# Patient Record
Sex: Female | Born: 1995 | Race: White | Hispanic: No | Marital: Single | Smoking: Never smoker
Health system: Southern US, Community
[De-identification: ages and names within clinical notes are randomized; demographics above are authoritative.]

## PROBLEM LIST (undated history)

## (undated) ENCOUNTER — Inpatient Hospital Stay (HOSPITAL_COMMUNITY): Payer: Self-pay

## (undated) ENCOUNTER — Ambulatory Visit: Admission: EM | Payer: Medicaid Other | Source: Home / Self Care

## (undated) ENCOUNTER — Emergency Department (HOSPITAL_COMMUNITY): Admission: EM | Payer: Medicaid Other | Source: Home / Self Care

## (undated) DIAGNOSIS — F419 Anxiety disorder, unspecified: Secondary | ICD-10-CM

## (undated) DIAGNOSIS — N83202 Unspecified ovarian cyst, left side: Secondary | ICD-10-CM

## (undated) HISTORY — PX: WISDOM TOOTH EXTRACTION: SHX21

## (undated) HISTORY — PX: TONSILLECTOMY: SUR1361

## (undated) HISTORY — PX: TONSILLECTOMY AND ADENOIDECTOMY: SHX28

---
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
## 2015-05-09 ENCOUNTER — Encounter (HOSPITAL_COMMUNITY): Payer: Self-pay | Admitting: *Deleted

## 2015-05-09 ENCOUNTER — Emergency Department (INDEPENDENT_AMBULATORY_CARE_PROVIDER_SITE_OTHER)
Admission: EM | Admit: 2015-05-09 | Discharge: 2015-05-09 | Disposition: A | Discharge status: Home / Self Care | Payer: Medicaid Other | Source: Home / Self Care | Attending: Emergency Medicine | Admitting: Emergency Medicine

## 2015-05-09 DIAGNOSIS — Z23 Encounter for immunization: Secondary | ICD-10-CM | POA: Diagnosis not present

## 2015-05-09 DIAGNOSIS — L03012 Cellulitis of left finger: Secondary | ICD-10-CM | POA: Diagnosis not present

## 2015-05-09 MED ORDER — TETANUS-DIPHTH-ACELL PERTUSSIS 5-2.5-18.5 LF-MCG/0.5 IM SUSP
INTRAMUSCULAR | Status: AC
  Filled 2015-05-09: qty 0.5

## 2015-05-09 MED ORDER — TETANUS-DIPHTH-ACELL PERTUSSIS 5-2.5-18.5 LF-MCG/0.5 IM SUSP
0.5000 mL | Freq: Once | INTRAMUSCULAR | Status: AC
  Administered 2015-05-09: 0.5 mL via INTRAMUSCULAR

## 2015-05-09 MED ORDER — AMOXICILLIN-POT CLAVULANATE 875-125 MG PO TABS: 1.0000 | ORAL_TABLET | Freq: Two times a day (BID) | ORAL | Status: DC

## 2015-05-09 NOTE — Discharge Instructions (Signed)
You have an infection called paronychia. This is an infection around the nail. Take Augmentin twice a day for 10 days. This often causes some diarrhea. Soak your finger in warm soapy water 3 times a day. I do not see any signs of infection being in your bloodstream. You can take 600 mg of ibuprofen every 6 hours as needed for pain. Follow-up if not improving in 2 days.

## 2015-05-09 NOTE — ED Notes (Signed)
Pt  Reports      l  Middle    Finger       Had  A  Hangnail        And  She  Went  To a  Nail  Salon  And  They  Worked  On the  Nail  With  A  Clipper    Now  She  Has  Pain     Going up  Hand  And  Arm

## 2015-05-09 NOTE — ED Provider Notes (Signed)
CSN: [id number]     Arrival date & time 05/09/15  1906 History   First MD Initiated Contact with Patient 05/09/15 1916     Chief Complaint  Patient presents with  . Hand Problem   (Consider location/radiation/quality/duration/timing/severity/associated sxs/prior Treatment) HPI She is a 19 year old woman here for evaluation of left middle finger infection. She states she had an infected hangnail about 3 days ago. She went to the nail salon today and they dug out the infection. She reports throbbing pain in the distal middle finger since that time. She also reports feeling pain into her wrist and forearm. She describes swollen lymph nodes in her neck as well. She denies any fevers or chills. She does not know when her last tetanus shot was.  History reviewed. No pertinent past medical history. History reviewed. No pertinent past surgical history. History reviewed. No pertinent family history. Social History  Substance Use Topics  . Smoking status: None  . Smokeless tobacco: None  . Alcohol Use: No   OB History    No data available     Review of Systems As in history of present illness Allergies  Codeine  Home Medications   Prior to Admission medications   Medication Sig Start Date End Date Taking? Authorizing Provider  amoxicillin-clavulanate (AUGMENTIN) 875-125 MG tablet Take 1 tablet by mouth 2 (two) times daily. 05/09/15   Charm RingsErin J Hosanna Betley, MD   Meds Ordered and Administered this Visit   Medications  Tdap (BOOSTRIX) injection 0.5 mL (not administered)    BP 115/69 mmHg  Pulse 94  Temp(Src) 98 F (36.7 C) (Oral)  SpO2 96% No data found.   Physical Exam  Constitutional: She is oriented to person, place, and time. She appears well-developed and well-nourished. No distress.  HENT:  Mouth/Throat: Oropharynx is clear and moist. No oropharyngeal exudate.  Neck: Neck supple.  Cardiovascular: Normal rate.   Pulmonary/Chest: Effort normal.  Lymphadenopathy:    She has  cervical adenopathy.  Neurological: She is alert and oriented to person, place, and time.  Skin:  Left middle finger: There is erythema primarily to the radial aspect of the nail. No palpable fluctuance. No streaking. She has full extension and flexion of her finger without pain. No swelling or tenderness of the wrist or forearm.    ED Course  Procedures (including critical care time)  Labs Review Labs Reviewed - No data to display  Imaging Review No results found.   MDM   1. Paronychia, left    Treatment with Augmentin. Tetanus updated. Follow-up if no improvement in 2 days.    Charm RingsErin J Nikisha Fleece, MD 05/09/15 [phone]

## 2016-06-05 DIAGNOSIS — O1493 Unspecified pre-eclampsia, third trimester: Secondary | ICD-10-CM

## 2016-06-05 HISTORY — DX: Unspecified pre-eclampsia, third trimester: O14.93

## 2016-12-18 ENCOUNTER — Other Ambulatory Visit (HOSPITAL_COMMUNITY): Payer: Self-pay | Admitting: Obstetrics and Gynecology

## 2016-12-18 DIAGNOSIS — Z3689 Encounter for other specified antenatal screening: Secondary | ICD-10-CM

## 2016-12-25 ENCOUNTER — Encounter (HOSPITAL_COMMUNITY): Payer: Self-pay

## 2016-12-26 ENCOUNTER — Ambulatory Visit (HOSPITAL_COMMUNITY)
Admission: RE | Admit: 2016-12-26 | Discharge: 2016-12-26 | Disposition: A | Discharge status: Home / Self Care | Payer: Medicaid Other | Source: Ambulatory Visit | Attending: Obstetrics and Gynecology | Admitting: Obstetrics and Gynecology

## 2016-12-26 ENCOUNTER — Encounter (HOSPITAL_COMMUNITY): Payer: Self-pay

## 2016-12-26 ENCOUNTER — Other Ambulatory Visit (HOSPITAL_COMMUNITY): Payer: Self-pay | Admitting: Obstetrics and Gynecology

## 2016-12-26 DIAGNOSIS — O358XX0 Maternal care for other (suspected) fetal abnormality and damage, not applicable or unspecified: Secondary | ICD-10-CM | POA: Insufficient documentation

## 2016-12-26 DIAGNOSIS — Z3A20 20 weeks gestation of pregnancy: Secondary | ICD-10-CM | POA: Diagnosis not present

## 2016-12-26 DIAGNOSIS — IMO0001 Reserved for inherently not codable concepts without codable children: Secondary | ICD-10-CM

## 2016-12-26 DIAGNOSIS — IMO0002 Reserved for concepts with insufficient information to code with codable children: Secondary | ICD-10-CM | POA: Insufficient documentation

## 2016-12-26 DIAGNOSIS — Z363 Encounter for antenatal screening for malformations: Secondary | ICD-10-CM | POA: Insufficient documentation

## 2016-12-26 DIAGNOSIS — Z3689 Encounter for other specified antenatal screening: Secondary | ICD-10-CM

## 2016-12-26 DIAGNOSIS — Q9359 Other deletions of part of a chromosome: Secondary | ICD-10-CM | POA: Insufficient documentation

## 2016-12-26 HISTORY — DX: Anxiety disorder, unspecified: F41.9

## 2016-12-26 NOTE — Progress Notes (Signed)
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 She has ben sent here for confirmation and evaluation. Her pregnancy course has been unremarkable so far.. She reports good fetal movement and no vaginal bleeding. There are no primary relatives with renal anomaly but does report a paternal cousin with unilateral renal agenesis OB History: OB History    Gravida Para Term Preterm AB Living   1         0   SAB TAB Ectopic Multiple Live Births                  PMH:  Past Medical History:  Diagnosis Date  . Anxiety     PSH:  Past Surgical History:  Procedure Laterality Date  . TONSILLECTOMY AND ADENOIDECTOMY     Meds: PNV Allergies: Codiene FH: As above. Father has CHTN, paternal grandfather has diabetes Soc: see EPIC section  Review of Systems: no vaginal bleeding or cramping/contractions, no LOF, no nausea/vomiting. All other systems reviewed and are negative.  PNL: see EPIC section   PE: See EPIC section  Please see separate document for fetal ultrasound report.  A/P: Unilateral renal agenesis: I discussed the finding with the patient and her partner. They understand that unilateral agenesis usually does not cause any interference with the pregnancy course, but that the pediatrician should be informed to arrange follow up in the postnatal period. There is no significant association between unilateral agenesis and any genetic abnormalities. A repeat scan at 28 and 34-36 weeks can be considered and this can be done in your office our ours, at your discretion  Thank you for the opportunity to be a part of the care of Tricia Allen. Please contact our office if we can be of further assistance.   I spent approximately 30 minutes with this patient with over 50% of  time spent in face-to-face counseling.

## 2017-01-14 ENCOUNTER — Inpatient Hospital Stay (HOSPITAL_COMMUNITY)
Admission: AD | Admit: 2017-01-14 | Discharge: 2017-01-14 | Disposition: A | Discharge status: Home / Self Care | Payer: Medicaid Other | Source: Ambulatory Visit | Attending: Obstetrics & Gynecology | Admitting: Obstetrics & Gynecology

## 2017-01-14 ENCOUNTER — Encounter (HOSPITAL_COMMUNITY): Payer: Self-pay

## 2017-01-14 DIAGNOSIS — O9989 Other specified diseases and conditions complicating pregnancy, childbirth and the puerperium: Secondary | ICD-10-CM

## 2017-01-14 DIAGNOSIS — Z885 Allergy status to narcotic agent status: Secondary | ICD-10-CM | POA: Insufficient documentation

## 2017-01-14 DIAGNOSIS — O2342 Unspecified infection of urinary tract in pregnancy, second trimester: Secondary | ICD-10-CM

## 2017-01-14 DIAGNOSIS — Z9889 Other specified postprocedural states: Secondary | ICD-10-CM | POA: Diagnosis not present

## 2017-01-14 DIAGNOSIS — Z3A23 23 weeks gestation of pregnancy: Secondary | ICD-10-CM | POA: Insufficient documentation

## 2017-01-14 DIAGNOSIS — M549 Dorsalgia, unspecified: Secondary | ICD-10-CM | POA: Diagnosis present

## 2017-01-14 LAB — URINALYSIS, ROUTINE W REFLEX MICROSCOPIC
Bilirubin Urine: NEGATIVE
Glucose, UA: NEGATIVE mg/dL
Hgb urine dipstick: NEGATIVE
KETONES UR: NEGATIVE mg/dL
Nitrite: NEGATIVE
PH: 7 (ref 5.0–8.0)
Protein, ur: NEGATIVE mg/dL
Specific Gravity, Urine: 1.005 (ref 1.005–1.030)

## 2017-01-14 LAB — CBC
HEMATOCRIT: 29 % — AB (ref 36.0–46.0)
HEMOGLOBIN: 10.3 g/dL — AB (ref 12.0–15.0)
MCH: 33.9 pg (ref 26.0–34.0)
MCHC: 35.5 g/dL (ref 30.0–36.0)
MCV: 95.4 fL (ref 78.0–100.0)
Platelets: 177 10*3/uL (ref 150–400)
RBC: 3.04 MIL/uL — AB (ref 3.87–5.11)
RDW: 12.5 % (ref 11.5–15.5)
WBC: 8.8 10*3/uL (ref 4.0–10.5)

## 2017-01-14 MED ORDER — CEPHALEXIN 500 MG PO CAPS: 500.0000 mg | ORAL_CAPSULE | Freq: Four times a day (QID) | ORAL | 0 refills | Status: AC

## 2017-01-14 MED ORDER — ACETAMINOPHEN 500 MG PO TABS
1000.0000 mg | ORAL_TABLET | Freq: Four times a day (QID) | ORAL | Status: DC | PRN
  Administered 2017-01-14: 1000 mg via ORAL
  Filled 2017-01-14: qty 2

## 2017-01-14 NOTE — MAU Note (Signed)
Patient presents to MAU with c/o back and right side pain for the last week. Was treated prophylactic for a UTI at her office visit last week. Pain has continued and gotten worse and constant yesterday. +FM. Denies LOF and VB.

## 2017-01-14 NOTE — MAU Provider Note (Signed)
History     CSN: [id number]  Arrival date and time: 01/14/17 2047   First Provider Initiated Contact with Patient 01/14/17 2126      Chief Complaint  Patient presents with  . Back Pain   G1 @23 .4 weeks here with back and flank pain. Sx started 1 week ago. Pain is on the right mid back and right flank and is constant. She has not taken anything for the pain. She was seen at her primary OB and started Macrobid for presumed UTI 5 days ago and has 1 pill left. No UA or UC are on file. Pain worsened 3 days ago. No dysuria, polyuria, or hematuria. She denies fever but feels chills at times. Feels nauseated at times but no vomiting. Eating and drinking well. She denies any recent lifting or strenuous activity. Reports good FM. No ctx, VB, or LOF.    OB History    Gravida Para Term Preterm AB Living   1         0   SAB TAB Ectopic Multiple Live Births                  Past Medical History:  Diagnosis Date  . Anxiety     Past Surgical History:  Procedure Laterality Date  . TONSILLECTOMY    . TONSILLECTOMY AND ADENOIDECTOMY      No family history on file.  Social History  Substance Use Topics  . Smoking status: Never Smoker  . Smokeless tobacco: Never Used  . Alcohol use No    Allergies:  Allergies  Allergen Reactions  . Codeine     Prescriptions Prior to Admission  Medication Sig Dispense Refill Last Dose  . IRON PO Take by mouth.   Past Week at Unknown time  . nitrofurantoin, macrocrystal-monohydrate, (MACROBID) 100 MG capsule Take 100 mg by mouth 2 (two) times daily.   01/14/2017 at 2000  . Prenatal Vit w/Fe-Methylfol-FA (PNV PO) Take by mouth.   Past Week at Unknown time  . amoxicillin-clavulanate (AUGMENTIN) 875-125 MG tablet Take 1 tablet by mouth 2 (two) times daily. (Patient not taking: Reported on 12/26/2016) 20 tablet 0 Not Taking    Review of Systems  Constitutional: Positive for chills. Negative for fever.  Gastrointestinal: Positive for nausea. Negative  for abdominal pain, constipation, diarrhea and vomiting.  Genitourinary: Negative for dysuria, frequency, hematuria and vaginal bleeding.  Musculoskeletal: Positive for back pain.   Physical Exam   Blood pressure 125/75, pulse 97, temperature 98.8 F (37.1 C), temperature source Oral, resp. rate 16, height 5\' 2"  (1.575 m), weight 134 lb (60.8 kg), last menstrual period 08/02/2016, SpO2 98 %.  Physical Exam  Nursing note and vitals reviewed. Constitutional: She is oriented to person, place, and time. She appears well-developed and well-nourished. No distress (appears comfortable).  HENT:  Head: Normocephalic and atraumatic.  Neck: Normal range of motion.  Respiratory: Effort normal. No respiratory distress.  GI: Soft. She exhibits no distension. There is no tenderness.    gravid  Musculoskeletal: Normal range of motion.       Cervical back: Normal. She exhibits no tenderness.       Thoracic back: She exhibits tenderness (right).       Lumbar back: Normal. She exhibits no tenderness.  Neurological: She is alert and oriented to person, place, and time.  Skin: Skin is warm and dry.  Psychiatric: She has a normal mood and affect.  EFM: 140 bpm, mod variability, + accels, no decels Toco: none  Results for orders placed or performed during the hospital encounter of 01/14/17 (from the past 24 hour(s))  Urinalysis, Routine w reflex microscopic     Status: Abnormal   Collection Time: 01/14/17  9:00 PM  Result Value Ref Range   Color, Urine STRAW (A) YELLOW   APPearance CLEAR CLEAR   Specific Gravity, Urine 1.005 1.005 - 1.030   pH 7.0 5.0 - 8.0   Glucose, UA NEGATIVE NEGATIVE mg/dL   Hgb urine dipstick NEGATIVE NEGATIVE   Bilirubin Urine NEGATIVE NEGATIVE   Ketones, ur NEGATIVE NEGATIVE mg/dL   Protein, ur NEGATIVE NEGATIVE mg/dL   Nitrite NEGATIVE NEGATIVE   Leukocytes, UA SMALL (A) NEGATIVE   RBC / HPF 0-5 0 - 5 RBC/hpf   WBC, UA 0-5 0 - 5 WBC/hpf   Bacteria, UA MANY (A) NONE  SEEN   Squamous Epithelial / LPF 0-5 (A) NONE SEEN  CBC     Status: Abnormal   Collection Time: 01/14/17  9:46 PM  Result Value Ref Range   WBC 8.8 4.0 - 10.5 K/uL   RBC 3.04 (L) 3.87 - 5.11 MIL/uL   Hemoglobin 10.3 (L) 12.0 - 15.0 g/dL   HCT 16.129.0 (L) 09.636.0 - 04.546.0 %   MCV 95.4 78.0 - 100.0 fL   MCH 33.9 26.0 - 34.0 pg   MCHC 35.5 30.0 - 36.0 g/dL   RDW 40.912.5 81.111.5 - 91.415.5 %   Platelets 177 150 - 400 K/uL   MAU Course  Procedures Tylenol Heating pad  MDM Labs ordered and reviewed. Suspect UTI based on sx and UA, unlikely pyelo at this time. Pain improved after Tylenol and heat. Will treat with better and longer coverage abx. Stable for discharge home  Assessment and Plan   1. [redacted] weeks gestation of pregnancy   2. Urinary tract infection in mother during second trimester of pregnancy    Discharge home Follow up with primary OB as scheduled Rx Keflex UC pending Pyelo/ return precautions  Allergies as of 01/14/2017      Reactions   Codeine       Medication List    STOP taking these medications   amoxicillin-clavulanate 875-125 MG tablet Commonly known as:  AUGMENTIN   nitrofurantoin (macrocrystal-monohydrate) 100 MG capsule Commonly known as:  MACROBID     TAKE these medications   cephALEXin 500 MG capsule Commonly known as:  KEFLEX Take 1 capsule (500 mg total) by mouth 4 (four) times daily.   IRON PO Take by mouth.   PNV PO Take by mouth.      Donette LarryMelanie Greta Yung, CNM 01/14/2017, 10:46 PM

## 2017-01-14 NOTE — Discharge Instructions (Signed)

## 2017-01-16 LAB — CULTURE, OB URINE: Culture: NO GROWTH

## 2017-01-17 ENCOUNTER — Telehealth: Payer: Self-pay | Admitting: Certified Nurse Midwife

## 2017-01-17 NOTE — Telephone Encounter (Signed)
Notified pt that UC was negative. She reports back pain is improving but still present. I instructed her to stop the Keflex but notify her primary OB provider if pain worsens or does not completely resolve. She verbalized understanding.

## 2017-08-01 ENCOUNTER — Encounter (HOSPITAL_COMMUNITY): Payer: Self-pay

## 2018-07-26 IMAGING — US US MFM OB DETAIL+14 WK
1 series · 14 of 28 positions shown · non-contrast
Comparison: none

[Series 1: us mfm ob detail+14 wk · 78 acquisitions, 14 frames shown]
[im 3/78]
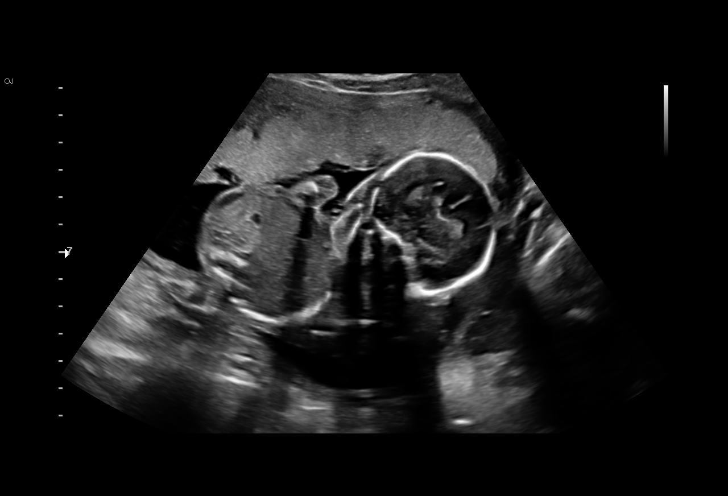
[im 9/78]
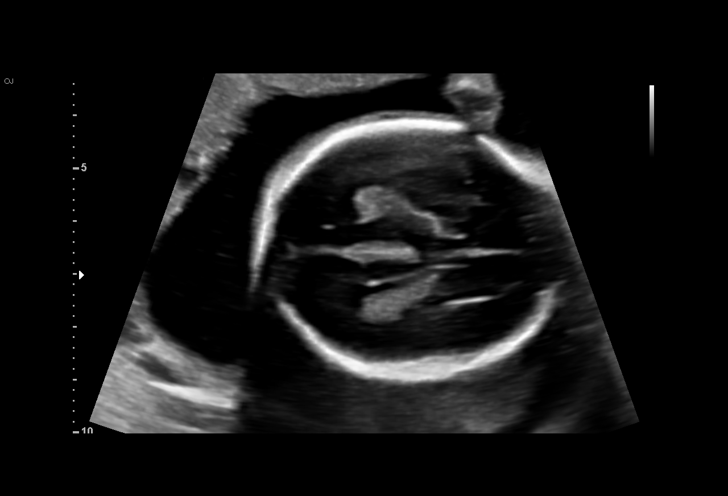
[im 15/78]
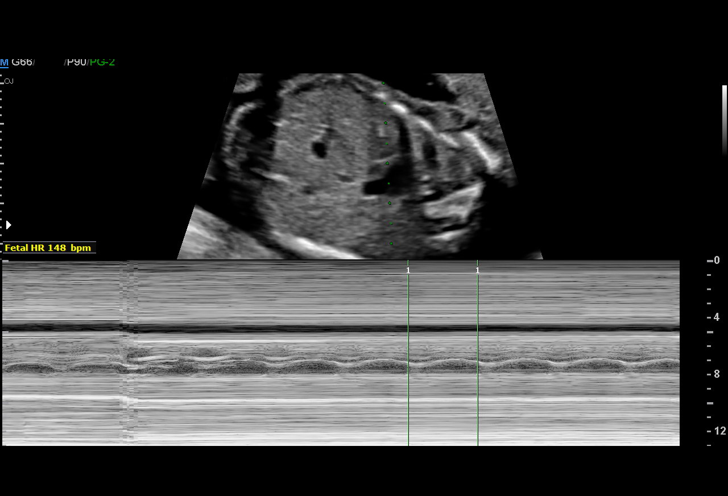
[im 20/78]
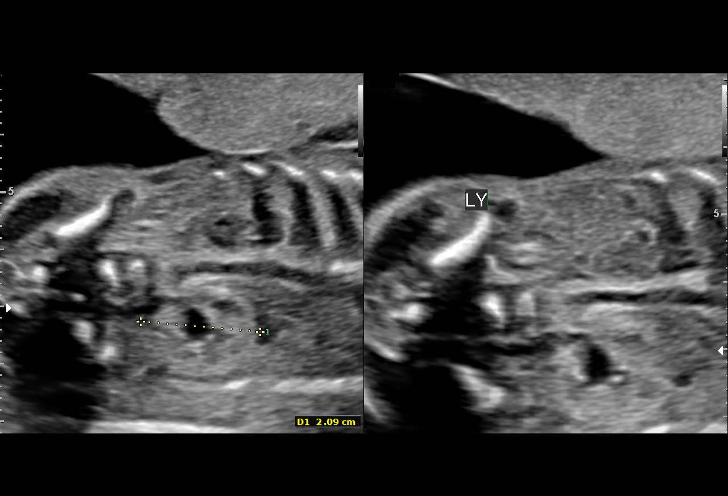
[im 26/78]
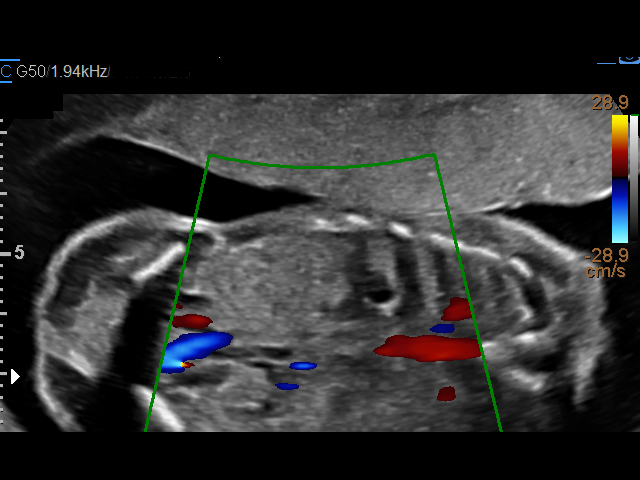
[im 32/78]
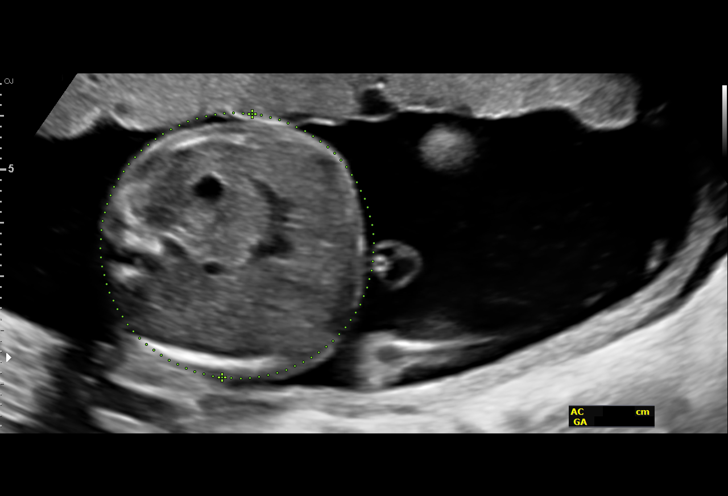
[im 38/78]
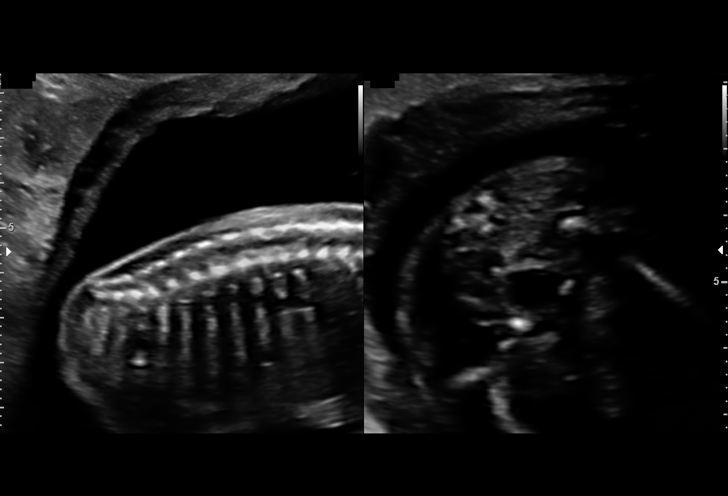
[im 43/78]
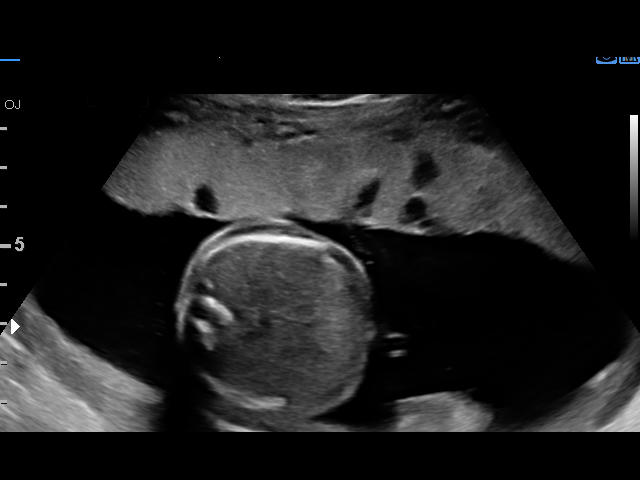
[im 49/78]
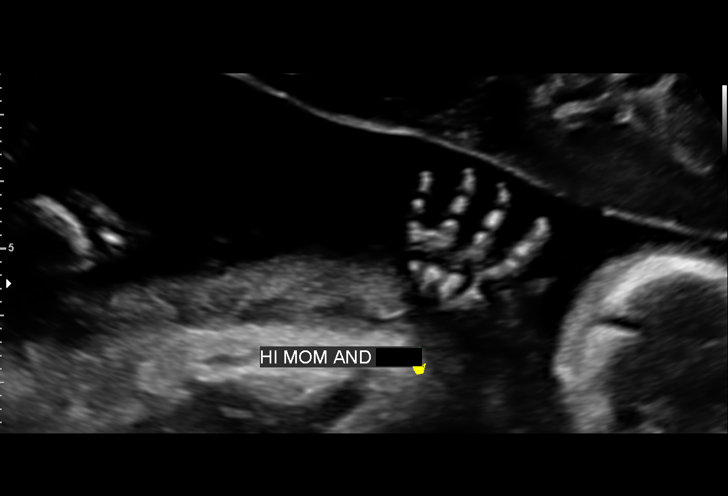
[im 55/78]
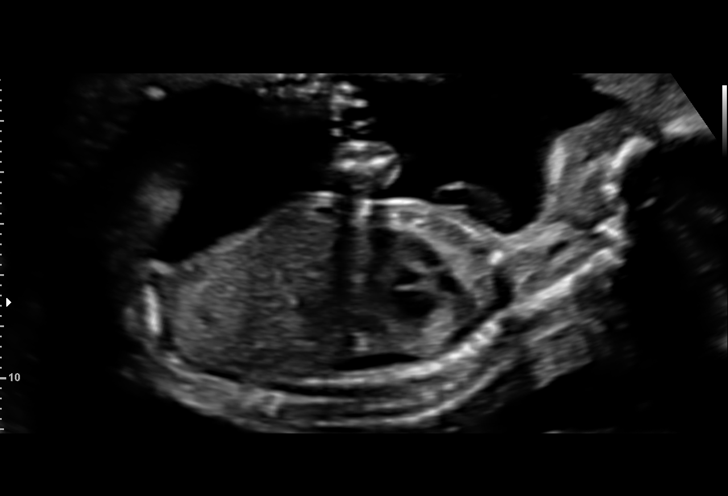
[im 60/78]
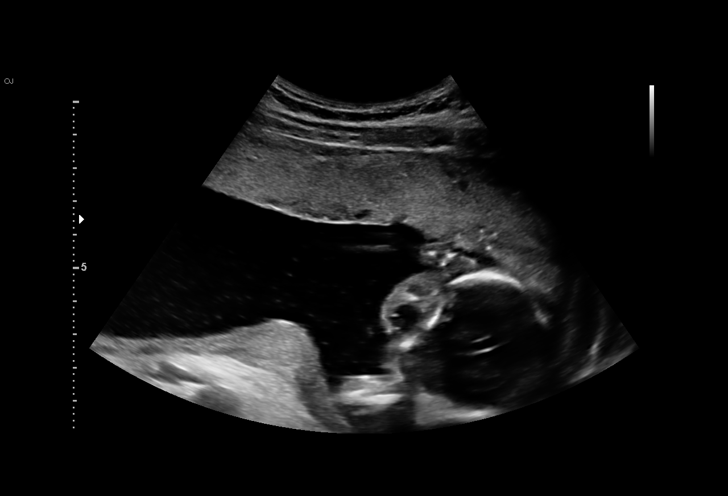
[im 66/78]
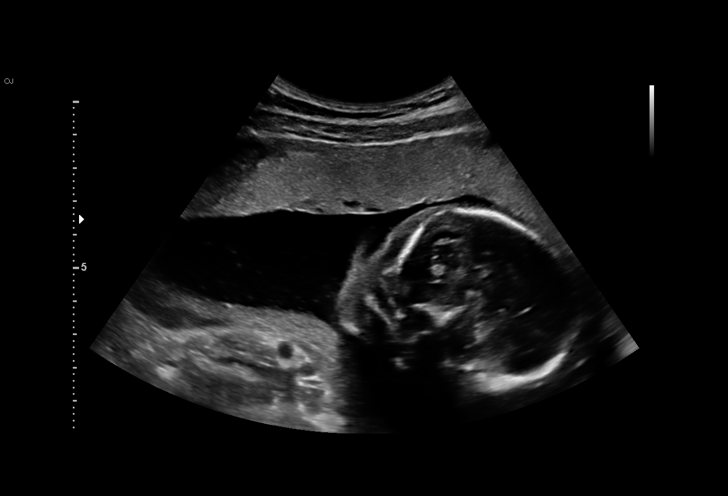
[im 72/78]
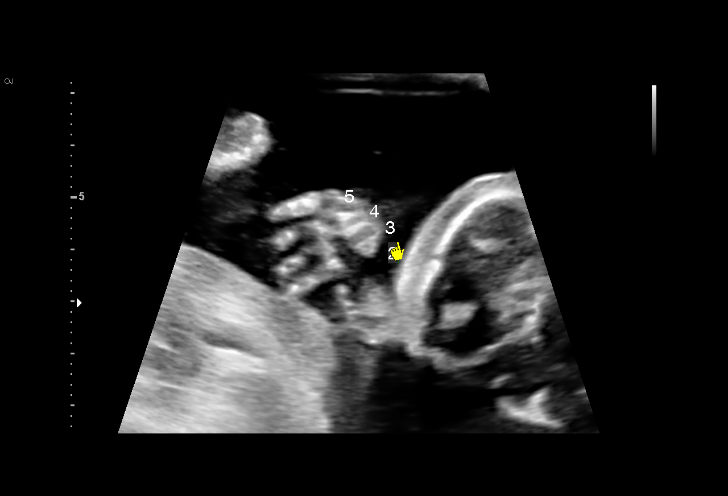
[im 78/78]
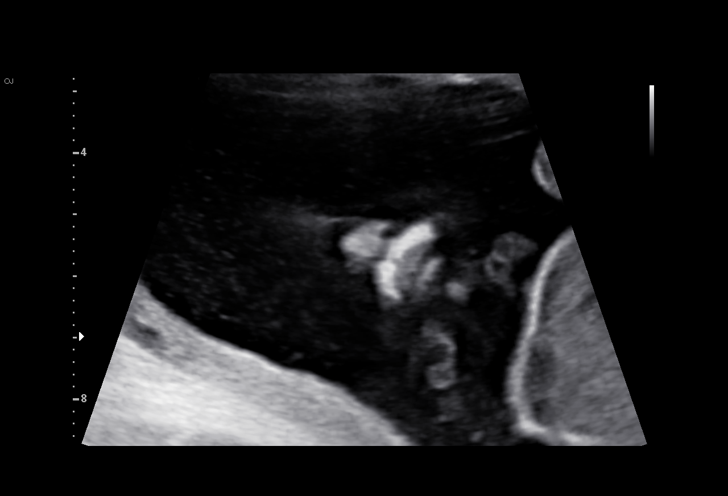

[14 of 28 positions shown; findings below may reference images not displayed]

1  ANGI BILLIOT           [id number]      [id number]     [id number]
Indications

20 weeks gestation of pregnancy
Encounter for antenatal screening for
malformations
Fetal renal anomaly, unspecified fetus(left
renal agenesis
OB History

Blood Type:            Height:  5'2"   Weight (lb):  128       BMI:
Gravidity:    1         Term:   0        Prem:   0        SAB:   0
TOP:          0       Ectopic:  0        Living: 0
Fetal Evaluation

Num Of Fetuses:     1
Fetal Heart         148
Rate(bpm):
Cardiac Activity:   Observed
Presentation:       Cephalic
Placenta:           Anterior, above cervical os
P. Cord Insertion:  Visualized

Amniotic Fluid
AFI FV:      Subjectively within normal limits

Largest Pocket(cm)
5.0
Biometry

BPD:      48.1  mm     G. Age:  20w 4d         36  %    CI:        75.38   %    70 - 86
FL/HC:      19.2   %    15.9 -
HC:      175.7  mm     G. Age:  20w 0d         13  %    HC/AC:      1.14        1.06 -
AC:      154.5  mm     G. Age:  20w 5d         36  %    FL/BPD:     70.1   %
FL:       33.7  mm     G. Age:  20w 4d         31  %    FL/AC:      21.8   %    20 - 24
HUM:      32.6  mm     G. Age:  21w 0d         50  %
CER:      22.6  mm     G. Age:  21w 2d         61  %
NFT:       5.8  mm
CM:        5.4  mm

Est. FW:     361  gm    0 lb 13 oz      38  %
Gestational Age

LMP:           20w 6d        Date:  08/02/16                 EDD:   05/09/17
U/S Today:     20w 3d                                        EDD:   05/12/17
Best:          20w 6d     Det. By:  LMP  (08/02/16)          EDD:   05/09/17
Anatomy

Cranium:               Appears normal         Aortic Arch:            Appears normal
Cavum:                 Appears normal         Ductal Arch:            Appears normal
Ventricles:            Appears normal         Diaphragm:              Appears normal
Choroid Plexus:        Appears normal         Stomach:                Appears normal, left
sided
Cerebellum:            Appears normal         Abdomen:                Appears normal
Posterior Fossa:       Appears normal         Abdominal Wall:         Appears nml (cord
insert, abd wall)
Nuchal Fold:           Not applicable (>20    Cord Vessels:           Appears normal (3
wks GA)                                        vessel cord)
Face:                  Appears normal         Kidneys:                Absent left kidney
(orbits and profile)
Lips:                  Appears normal         Bladder:                Appears normal
Thoracic:              Appears normal         Spine:                  Appears normal
Heart:                 Echogenic focus        Upper Extremities:      Appears normal
in LV
RVOT:                  Appears normal         Lower Extremities:      Appears normal
LVOT:                  Appears normal

Other:  Male gender. Heels and Rt 5th digit visualized.
Cervix Uterus Adnexa

Cervix
Length:            3.5  cm.
Normal appearance by transabdominal scan.

Uterus
Normal shape and size.
Impression

Singleton intrauterine pregnancy at 20+6 weeks with
suspected unilateral renal agenesis
Review of the anatomy shows an empty left renal fossae.
there is an absent left renal artery. There is a nonpathologic
echogenic intracardiac focus. There are no other sonographic
markers for aneuploidy or structural anomalies
all relevant anatomy has been visualized
Amniotic fluid volume is normal
Estimated fetal weight is 361g which is growth in the 38th
percentile
Recommendations
See MFM consult

## 2019-12-04 DIAGNOSIS — Z419 Encounter for procedure for purposes other than remedying health state, unspecified: Secondary | ICD-10-CM | POA: Diagnosis not present

## 2019-12-19 DIAGNOSIS — J069 Acute upper respiratory infection, unspecified: Secondary | ICD-10-CM | POA: Diagnosis not present

## 2019-12-19 DIAGNOSIS — M545 Low back pain: Secondary | ICD-10-CM | POA: Diagnosis not present

## 2019-12-19 DIAGNOSIS — R319 Hematuria, unspecified: Secondary | ICD-10-CM | POA: Diagnosis not present

## 2019-12-19 DIAGNOSIS — R0789 Other chest pain: Secondary | ICD-10-CM | POA: Diagnosis not present

## 2019-12-19 DIAGNOSIS — J029 Acute pharyngitis, unspecified: Secondary | ICD-10-CM | POA: Diagnosis not present

## 2019-12-25 DIAGNOSIS — J029 Acute pharyngitis, unspecified: Secondary | ICD-10-CM | POA: Diagnosis not present

## 2019-12-25 DIAGNOSIS — Z20822 Contact with and (suspected) exposure to covid-19: Secondary | ICD-10-CM | POA: Diagnosis not present

## 2019-12-30 DIAGNOSIS — Z113 Encounter for screening for infections with a predominantly sexual mode of transmission: Secondary | ICD-10-CM | POA: Diagnosis not present

## 2019-12-30 DIAGNOSIS — Z3009 Encounter for other general counseling and advice on contraception: Secondary | ICD-10-CM | POA: Diagnosis not present

## 2019-12-30 DIAGNOSIS — Z114 Encounter for screening for human immunodeficiency virus [HIV]: Secondary | ICD-10-CM | POA: Diagnosis not present

## 2019-12-30 DIAGNOSIS — Z202 Contact with and (suspected) exposure to infections with a predominantly sexual mode of transmission: Secondary | ICD-10-CM | POA: Diagnosis not present

## 2020-01-09 DIAGNOSIS — R079 Chest pain, unspecified: Secondary | ICD-10-CM | POA: Diagnosis not present

## 2020-01-09 DIAGNOSIS — F410 Panic disorder [episodic paroxysmal anxiety] without agoraphobia: Secondary | ICD-10-CM | POA: Diagnosis not present

## 2020-01-09 DIAGNOSIS — F411 Generalized anxiety disorder: Secondary | ICD-10-CM | POA: Diagnosis not present

## 2020-02-04 DIAGNOSIS — Z419 Encounter for procedure for purposes other than remedying health state, unspecified: Secondary | ICD-10-CM | POA: Diagnosis not present

## 2020-02-18 DIAGNOSIS — N939 Abnormal uterine and vaginal bleeding, unspecified: Secondary | ICD-10-CM | POA: Diagnosis not present

## 2020-02-18 DIAGNOSIS — Z124 Encounter for screening for malignant neoplasm of cervix: Secondary | ICD-10-CM | POA: Diagnosis not present

## 2020-03-11 DIAGNOSIS — L819 Disorder of pigmentation, unspecified: Secondary | ICD-10-CM | POA: Diagnosis not present

## 2020-03-11 DIAGNOSIS — N898 Other specified noninflammatory disorders of vagina: Secondary | ICD-10-CM | POA: Diagnosis not present

## 2020-03-15 DIAGNOSIS — J029 Acute pharyngitis, unspecified: Secondary | ICD-10-CM | POA: Diagnosis not present

## 2020-04-12 DIAGNOSIS — N9089 Other specified noninflammatory disorders of vulva and perineum: Secondary | ICD-10-CM | POA: Diagnosis not present

## 2020-04-15 DIAGNOSIS — R319 Hematuria, unspecified: Secondary | ICD-10-CM | POA: Diagnosis not present

## 2020-05-05 DIAGNOSIS — Z419 Encounter for procedure for purposes other than remedying health state, unspecified: Secondary | ICD-10-CM | POA: Diagnosis not present

## 2020-06-05 DIAGNOSIS — Z419 Encounter for procedure for purposes other than remedying health state, unspecified: Secondary | ICD-10-CM | POA: Diagnosis not present

## 2020-06-10 DIAGNOSIS — Z8739 Personal history of other diseases of the musculoskeletal system and connective tissue: Secondary | ICD-10-CM | POA: Diagnosis not present

## 2020-06-10 DIAGNOSIS — Z113 Encounter for screening for infections with a predominantly sexual mode of transmission: Secondary | ICD-10-CM | POA: Diagnosis not present

## 2020-06-10 DIAGNOSIS — Z87448 Personal history of other diseases of urinary system: Secondary | ICD-10-CM | POA: Diagnosis not present

## 2020-06-24 DIAGNOSIS — R110 Nausea: Secondary | ICD-10-CM | POA: Diagnosis not present

## 2020-06-24 DIAGNOSIS — R109 Unspecified abdominal pain: Secondary | ICD-10-CM | POA: Diagnosis not present

## 2020-06-24 DIAGNOSIS — R1031 Right lower quadrant pain: Secondary | ICD-10-CM | POA: Diagnosis not present

## 2020-07-06 DIAGNOSIS — Z419 Encounter for procedure for purposes other than remedying health state, unspecified: Secondary | ICD-10-CM | POA: Diagnosis not present

## 2020-07-12 DIAGNOSIS — R3129 Other microscopic hematuria: Secondary | ICD-10-CM | POA: Diagnosis not present

## 2020-07-16 DIAGNOSIS — R3129 Other microscopic hematuria: Secondary | ICD-10-CM | POA: Diagnosis not present

## 2020-07-16 DIAGNOSIS — R109 Unspecified abdominal pain: Secondary | ICD-10-CM | POA: Diagnosis not present

## 2020-07-19 DIAGNOSIS — Z87442 Personal history of urinary calculi: Secondary | ICD-10-CM | POA: Diagnosis not present

## 2020-07-19 DIAGNOSIS — M546 Pain in thoracic spine: Secondary | ICD-10-CM | POA: Diagnosis not present

## 2020-07-19 DIAGNOSIS — R3129 Other microscopic hematuria: Secondary | ICD-10-CM | POA: Diagnosis not present

## 2020-07-19 DIAGNOSIS — Z79899 Other long term (current) drug therapy: Secondary | ICD-10-CM | POA: Diagnosis not present

## 2020-07-19 DIAGNOSIS — G8929 Other chronic pain: Secondary | ICD-10-CM | POA: Diagnosis not present

## 2020-07-20 DIAGNOSIS — L700 Acne vulgaris: Secondary | ICD-10-CM | POA: Diagnosis not present

## 2020-07-29 DIAGNOSIS — M546 Pain in thoracic spine: Secondary | ICD-10-CM | POA: Diagnosis not present

## 2020-07-29 DIAGNOSIS — L700 Acne vulgaris: Secondary | ICD-10-CM | POA: Diagnosis not present

## 2020-08-03 DIAGNOSIS — Z419 Encounter for procedure for purposes other than remedying health state, unspecified: Secondary | ICD-10-CM | POA: Diagnosis not present

## 2020-08-04 DIAGNOSIS — Z3009 Encounter for other general counseling and advice on contraception: Secondary | ICD-10-CM | POA: Diagnosis not present

## 2020-08-04 DIAGNOSIS — Z7251 High risk heterosexual behavior: Secondary | ICD-10-CM | POA: Diagnosis not present

## 2020-08-04 DIAGNOSIS — Z113 Encounter for screening for infections with a predominantly sexual mode of transmission: Secondary | ICD-10-CM | POA: Diagnosis not present

## 2020-08-04 DIAGNOSIS — Z202 Contact with and (suspected) exposure to infections with a predominantly sexual mode of transmission: Secondary | ICD-10-CM | POA: Diagnosis not present

## 2020-08-04 DIAGNOSIS — Z114 Encounter for screening for human immunodeficiency virus [HIV]: Secondary | ICD-10-CM | POA: Diagnosis not present

## 2020-08-20 DIAGNOSIS — R3129 Other microscopic hematuria: Secondary | ICD-10-CM | POA: Diagnosis not present

## 2020-08-20 DIAGNOSIS — A491 Streptococcal infection, unspecified site: Secondary | ICD-10-CM | POA: Diagnosis not present

## 2020-08-27 DIAGNOSIS — L700 Acne vulgaris: Secondary | ICD-10-CM | POA: Diagnosis not present

## 2020-08-27 DIAGNOSIS — Z79899 Other long term (current) drug therapy: Secondary | ICD-10-CM | POA: Diagnosis not present

## 2020-08-30 DIAGNOSIS — L700 Acne vulgaris: Secondary | ICD-10-CM | POA: Diagnosis not present

## 2020-09-02 DIAGNOSIS — R350 Frequency of micturition: Secondary | ICD-10-CM | POA: Diagnosis not present

## 2020-09-02 DIAGNOSIS — A568 Sexually transmitted chlamydial infection of other sites: Secondary | ICD-10-CM | POA: Diagnosis not present

## 2020-09-02 DIAGNOSIS — Z113 Encounter for screening for infections with a predominantly sexual mode of transmission: Secondary | ICD-10-CM | POA: Diagnosis not present

## 2020-09-03 DIAGNOSIS — Z202 Contact with and (suspected) exposure to infections with a predominantly sexual mode of transmission: Secondary | ICD-10-CM | POA: Diagnosis not present

## 2020-09-03 DIAGNOSIS — R300 Dysuria: Secondary | ICD-10-CM | POA: Diagnosis not present

## 2020-09-03 DIAGNOSIS — Z419 Encounter for procedure for purposes other than remedying health state, unspecified: Secondary | ICD-10-CM | POA: Diagnosis not present

## 2020-10-03 DIAGNOSIS — Z419 Encounter for procedure for purposes other than remedying health state, unspecified: Secondary | ICD-10-CM | POA: Diagnosis not present

## 2020-10-04 DIAGNOSIS — L700 Acne vulgaris: Secondary | ICD-10-CM | POA: Diagnosis not present

## 2020-10-07 DIAGNOSIS — Z79899 Other long term (current) drug therapy: Secondary | ICD-10-CM | POA: Diagnosis not present

## 2020-10-07 DIAGNOSIS — E785 Hyperlipidemia, unspecified: Secondary | ICD-10-CM | POA: Diagnosis not present

## 2020-10-07 DIAGNOSIS — L700 Acne vulgaris: Secondary | ICD-10-CM | POA: Diagnosis not present

## 2020-10-12 DIAGNOSIS — R3129 Other microscopic hematuria: Secondary | ICD-10-CM | POA: Diagnosis not present

## 2020-10-18 DIAGNOSIS — Z8619 Personal history of other infectious and parasitic diseases: Secondary | ICD-10-CM | POA: Diagnosis not present

## 2020-11-03 DIAGNOSIS — Z419 Encounter for procedure for purposes other than remedying health state, unspecified: Secondary | ICD-10-CM | POA: Diagnosis not present

## 2020-11-03 DIAGNOSIS — L700 Acne vulgaris: Secondary | ICD-10-CM | POA: Diagnosis not present

## 2020-11-12 DIAGNOSIS — R053 Chronic cough: Secondary | ICD-10-CM | POA: Diagnosis not present

## 2020-11-12 DIAGNOSIS — R300 Dysuria: Secondary | ICD-10-CM | POA: Diagnosis not present

## 2020-11-16 DIAGNOSIS — R531 Weakness: Secondary | ICD-10-CM | POA: Diagnosis not present

## 2020-11-23 DIAGNOSIS — L700 Acne vulgaris: Secondary | ICD-10-CM | POA: Diagnosis not present

## 2020-11-30 DIAGNOSIS — A491 Streptococcal infection, unspecified site: Secondary | ICD-10-CM | POA: Diagnosis not present

## 2020-11-30 DIAGNOSIS — R3129 Other microscopic hematuria: Secondary | ICD-10-CM | POA: Diagnosis not present

## 2020-12-03 DIAGNOSIS — Z419 Encounter for procedure for purposes other than remedying health state, unspecified: Secondary | ICD-10-CM | POA: Diagnosis not present

## 2020-12-20 DIAGNOSIS — N898 Other specified noninflammatory disorders of vagina: Secondary | ICD-10-CM | POA: Diagnosis not present

## 2020-12-23 DIAGNOSIS — R519 Headache, unspecified: Secondary | ICD-10-CM | POA: Diagnosis not present

## 2020-12-23 DIAGNOSIS — G43909 Migraine, unspecified, not intractable, without status migrainosus: Secondary | ICD-10-CM | POA: Diagnosis not present

## 2021-01-03 DIAGNOSIS — Z419 Encounter for procedure for purposes other than remedying health state, unspecified: Secondary | ICD-10-CM | POA: Diagnosis not present

## 2021-01-11 DIAGNOSIS — Z113 Encounter for screening for infections with a predominantly sexual mode of transmission: Secondary | ICD-10-CM | POA: Diagnosis not present

## 2021-01-11 DIAGNOSIS — Z114 Encounter for screening for human immunodeficiency virus [HIV]: Secondary | ICD-10-CM | POA: Diagnosis not present

## 2021-01-19 DIAGNOSIS — L700 Acne vulgaris: Secondary | ICD-10-CM | POA: Diagnosis not present

## 2021-01-30 DIAGNOSIS — K143 Hypertrophy of tongue papillae: Secondary | ICD-10-CM | POA: Diagnosis not present

## 2021-02-03 DIAGNOSIS — Z419 Encounter for procedure for purposes other than remedying health state, unspecified: Secondary | ICD-10-CM | POA: Diagnosis not present

## 2021-02-21 DIAGNOSIS — R350 Frequency of micturition: Secondary | ICD-10-CM | POA: Diagnosis not present

## 2021-02-21 DIAGNOSIS — L700 Acne vulgaris: Secondary | ICD-10-CM | POA: Diagnosis not present

## 2021-03-05 DIAGNOSIS — Z419 Encounter for procedure for purposes other than remedying health state, unspecified: Secondary | ICD-10-CM | POA: Diagnosis not present

## 2021-03-10 DIAGNOSIS — Z113 Encounter for screening for infections with a predominantly sexual mode of transmission: Secondary | ICD-10-CM | POA: Diagnosis not present

## 2021-03-23 DIAGNOSIS — Z113 Encounter for screening for infections with a predominantly sexual mode of transmission: Secondary | ICD-10-CM | POA: Diagnosis not present

## 2021-03-23 DIAGNOSIS — N760 Acute vaginitis: Secondary | ICD-10-CM | POA: Diagnosis not present

## 2021-03-25 DIAGNOSIS — L700 Acne vulgaris: Secondary | ICD-10-CM | POA: Diagnosis not present

## 2021-04-05 DIAGNOSIS — Z419 Encounter for procedure for purposes other than remedying health state, unspecified: Secondary | ICD-10-CM | POA: Diagnosis not present

## 2021-04-06 DIAGNOSIS — R059 Cough, unspecified: Secondary | ICD-10-CM | POA: Diagnosis not present

## 2021-04-25 DIAGNOSIS — R21 Rash and other nonspecific skin eruption: Secondary | ICD-10-CM | POA: Diagnosis not present

## 2021-04-27 DIAGNOSIS — Z01419 Encounter for gynecological examination (general) (routine) without abnormal findings: Secondary | ICD-10-CM | POA: Diagnosis not present

## 2021-04-27 DIAGNOSIS — N760 Acute vaginitis: Secondary | ICD-10-CM | POA: Diagnosis not present

## 2021-04-27 DIAGNOSIS — Z30011 Encounter for initial prescription of contraceptive pills: Secondary | ICD-10-CM | POA: Diagnosis not present

## 2021-05-05 DIAGNOSIS — Z419 Encounter for procedure for purposes other than remedying health state, unspecified: Secondary | ICD-10-CM | POA: Diagnosis not present

## 2021-05-09 DIAGNOSIS — L700 Acne vulgaris: Secondary | ICD-10-CM | POA: Diagnosis not present

## 2021-05-18 DIAGNOSIS — J101 Influenza due to other identified influenza virus with other respiratory manifestations: Secondary | ICD-10-CM | POA: Diagnosis not present

## 2021-05-18 DIAGNOSIS — H10022 Other mucopurulent conjunctivitis, left eye: Secondary | ICD-10-CM | POA: Diagnosis not present

## 2021-06-01 DIAGNOSIS — R3129 Other microscopic hematuria: Secondary | ICD-10-CM | POA: Diagnosis not present

## 2021-06-01 DIAGNOSIS — A491 Streptococcal infection, unspecified site: Secondary | ICD-10-CM | POA: Diagnosis not present

## 2021-06-05 DIAGNOSIS — Z419 Encounter for procedure for purposes other than remedying health state, unspecified: Secondary | ICD-10-CM | POA: Diagnosis not present

## 2021-06-09 DIAGNOSIS — L700 Acne vulgaris: Secondary | ICD-10-CM | POA: Diagnosis not present

## 2021-07-06 DIAGNOSIS — Z419 Encounter for procedure for purposes other than remedying health state, unspecified: Secondary | ICD-10-CM | POA: Diagnosis not present

## 2021-07-14 DIAGNOSIS — N760 Acute vaginitis: Secondary | ICD-10-CM | POA: Diagnosis not present

## 2021-07-14 DIAGNOSIS — Z3041 Encounter for surveillance of contraceptive pills: Secondary | ICD-10-CM | POA: Diagnosis not present

## 2021-07-14 DIAGNOSIS — Z113 Encounter for screening for infections with a predominantly sexual mode of transmission: Secondary | ICD-10-CM | POA: Diagnosis not present

## 2021-07-14 DIAGNOSIS — B3731 Acute candidiasis of vulva and vagina: Secondary | ICD-10-CM | POA: Diagnosis not present

## 2021-07-26 DIAGNOSIS — L02415 Cutaneous abscess of right lower limb: Secondary | ICD-10-CM | POA: Diagnosis not present

## 2021-07-26 DIAGNOSIS — R202 Paresthesia of skin: Secondary | ICD-10-CM | POA: Diagnosis not present

## 2021-07-26 DIAGNOSIS — R42 Dizziness and giddiness: Secondary | ICD-10-CM | POA: Diagnosis not present

## 2021-07-27 DIAGNOSIS — E538 Deficiency of other specified B group vitamins: Secondary | ICD-10-CM | POA: Diagnosis not present

## 2021-08-03 DIAGNOSIS — Z419 Encounter for procedure for purposes other than remedying health state, unspecified: Secondary | ICD-10-CM | POA: Diagnosis not present

## 2021-08-03 DIAGNOSIS — E538 Deficiency of other specified B group vitamins: Secondary | ICD-10-CM | POA: Diagnosis not present

## 2021-08-10 DIAGNOSIS — E538 Deficiency of other specified B group vitamins: Secondary | ICD-10-CM | POA: Diagnosis not present

## 2021-08-15 DIAGNOSIS — L02429 Furuncle of limb, unspecified: Secondary | ICD-10-CM | POA: Diagnosis not present

## 2021-08-15 DIAGNOSIS — L03113 Cellulitis of right upper limb: Secondary | ICD-10-CM | POA: Diagnosis not present

## 2021-08-17 DIAGNOSIS — E538 Deficiency of other specified B group vitamins: Secondary | ICD-10-CM | POA: Diagnosis not present

## 2021-09-03 DIAGNOSIS — Z419 Encounter for procedure for purposes other than remedying health state, unspecified: Secondary | ICD-10-CM | POA: Diagnosis not present

## 2021-09-30 DIAGNOSIS — E538 Deficiency of other specified B group vitamins: Secondary | ICD-10-CM | POA: Diagnosis not present

## 2021-10-03 DIAGNOSIS — Z419 Encounter for procedure for purposes other than remedying health state, unspecified: Secondary | ICD-10-CM | POA: Diagnosis not present

## 2021-11-03 DIAGNOSIS — Z419 Encounter for procedure for purposes other than remedying health state, unspecified: Secondary | ICD-10-CM | POA: Diagnosis not present

## 2021-11-04 DIAGNOSIS — E538 Deficiency of other specified B group vitamins: Secondary | ICD-10-CM | POA: Diagnosis not present

## 2021-11-15 ENCOUNTER — Ambulatory Visit
Admission: EM | Admit: 2021-11-15 | Discharge: 2021-11-15 | Disposition: A | Discharge status: Home / Self Care | Payer: Medicaid Other | Attending: Emergency Medicine | Admitting: Emergency Medicine

## 2021-11-15 DIAGNOSIS — L02612 Cutaneous abscess of left foot: Secondary | ICD-10-CM | POA: Diagnosis not present

## 2021-11-15 MED ORDER — SULFAMETHOXAZOLE-TRIMETHOPRIM 800-160 MG PO TABS: 1.0000 | ORAL_TABLET | Freq: Two times a day (BID) | ORAL | 0 refills | Status: AC

## 2021-11-15 NOTE — ED Triage Notes (Addendum)
Pt states she has a old laceration to the bottom of her right foot that is not fully healed and states she went to the pool. She states the wound now has pus draining.   Started: yesterday

## 2021-11-15 NOTE — ED Provider Notes (Signed)
[facility]    CSN: [id number] Arrival date & time: 11/15/21  1544    HISTORY   Chief Complaint  Patient presents with   Laceration   HPI Tricia Allen is a 26 y.o. female. Pt states she has a old laceration to the bottom of her right foot that is not fully healed and states she went to the pool. She states the wound now has pus inside of it but it is not draining.  Patient ambulated into the clinic independently without difficulty.  The history is provided by the patient.   Past Medical History:  Diagnosis Date   Anxiety    Patient Active Problem List   Diagnosis Date Noted   Fetal renal anomaly 12/26/2016   Past Surgical History:  Procedure Laterality Date   TONSILLECTOMY     TONSILLECTOMY AND ADENOIDECTOMY     OB History     Gravida  1   Para      Term      Preterm      AB      Living  0      SAB      IAB      Ectopic      Multiple      Live Births             Home Medications    Prior to Admission medications   Medication Sig Start Date End Date Taking? Authorizing Provider  IRON PO Take by mouth.    [provider]  Prenatal Vit w/Fe-Methylfol-FA (PNV PO) Take by mouth.    [provider]    Family History History reviewed. No pertinent family history. Social History Social History   Tobacco Use   Smoking status: Never   Smokeless tobacco: Never  Substance Use Topics   Alcohol use: No   Drug use: No   Allergies   Codeine  Review of Systems Review of Systems Pertinent findings noted in history of present illness.   Physical Exam Triage Vital Signs ED Triage Vitals  Enc Vitals Group     BP 04/01/21 0827 (!) 147/82     Pulse Rate 04/01/21 0827 72     Resp 04/01/21 0827 18     Temp 04/01/21 0827 98.3 F (36.8 C)     Temp Source 04/01/21 0827 Oral     SpO2 04/01/21 0827 98 %     Weight --      Height --      Head Circumference --      Peak Flow --      Pain Score 04/01/21 0826 5      Pain Loc --      Pain Edu? --      Excl. in GC? --   No data found.  Updated Vital Signs BP 100/64 (BP Location: Left Arm)   Pulse 71   Temp 98.1 F (36.7 C) (Oral)   Resp 16   LMP 11/15/2021   SpO2 98%   Breastfeeding No   Physical Exam Vitals and nursing note reviewed.  Constitutional:      General: She is not in acute distress.    Appearance: Normal appearance. She is not ill-appearing.  HENT:     Head: Normocephalic and atraumatic.  Eyes:     General: Lids are normal.        Right eye: No discharge.        Left eye: No discharge.     Extraocular Movements: Extraocular  movements intact.     Conjunctiva/sclera: Conjunctivae normal.     Right eye: Right conjunctiva is not injected.     Left eye: Left conjunctiva is not injected.  Neck:     Trachea: Trachea and phonation normal.  Cardiovascular:     Rate and Rhythm: Normal rate and regular rhythm.     Pulses: Normal pulses.     Heart sounds: Normal heart sounds. No murmur heard.    No friction rub. No gallop.  Pulmonary:     Effort: Pulmonary effort is normal. No accessory muscle usage, prolonged expiration or respiratory distress.     Breath sounds: Normal breath sounds. No stridor, decreased air movement or transmitted upper airway sounds. No decreased breath sounds, wheezing, rhonchi or rales.  Chest:     Chest wall: No tenderness.  Musculoskeletal:        General: Normal range of motion.     Cervical back: Normal range of motion and neck supple. Normal range of motion.     Right foot: Normal.     Left foot: Laceration (1.5 cm laceration well-healed across the medial aspect of the bottom of left foot.  Lateral to the incision a small abscess that is nonfluctuant, tender to palpation and surrounded by erythema.) present.  Lymphadenopathy:     Cervical: No cervical adenopathy.  Skin:    General: Skin is warm and dry.     Findings: No erythema or rash.  Neurological:     General: No focal deficit present.      Mental Status: She is alert and oriented to person, place, and time.  Psychiatric:        Mood and Affect: Mood normal.        Behavior: Behavior normal.     Visual Acuity Right Eye Distance:   Left Eye Distance:   Bilateral Distance:    Right Eye Near:   Left Eye Near:    Bilateral Near:     UC Couse / Diagnostics / Procedures:    EKG  Radiology No results found.  Procedures Procedures (including critical care time)  UC Diagnoses / Final Clinical Impressions(s)   I have reviewed the triage vital signs and the nursing notes.  Pertinent labs & imaging results that were available during my care of the patient were reviewed by me and considered in my medical decision making (see chart for details).    Final diagnoses:  Foot abscess, left   Patient provided with a prescription for Bactrim and advised to soak in Epsom salt several times a day.  Patient advised to monitor the wound for worsening infection go to the emergency room if not improving.  ED Prescriptions     Medication Sig Dispense Auth. Provider   sulfamethoxazole-trimethoprim (BACTRIM DS) 800-160 MG tablet Take 1 tablet by mouth 2 (two) times daily for 7 days. 14 tablet Theadora RamaMorgan, Jaquaya Coyle Scales, PA-C      PDMP not reviewed this encounter.  Pending results:  Labs Reviewed - No data to display  Medications Ordered in UC: Medications - No data to display  Disposition Upon Discharge:  Condition: stable for discharge home Home: take medications as prescribed; routine discharge instructions as discussed; follow up as advised.  Patient presented with an acute illness with associated systemic symptoms and significant discomfort requiring urgent management. In my opinion, this is a condition that a prudent lay person (someone who possesses an average knowledge of health and medicine) may potentially expect to result in complications if not addressed  urgently such as respiratory distress, impairment of bodily  function or dysfunction of bodily organs.   Routine symptom specific, illness specific and/or disease specific instructions were discussed with the patient and/or caregiver at length.   As such, the patient has been evaluated and assessed, work-up was performed and treatment was provided in alignment with urgent care protocols and evidence based medicine.  Patient/parent/caregiver has been advised that the patient may require follow up for further testing and treatment if the symptoms continue in spite of treatment, as clinically indicated and appropriate.  Patient/parent/caregiver has been advised to return to the [facility] or PCP if no better; to PCP or the Emergency Department if new signs and symptoms develop, or if the current signs or symptoms continue to change or worsen for further workup, evaluation and treatment as clinically indicated and appropriate  The patient will follow up with their current PCP if and as advised. If the patient does not currently have a PCP we will assist them in obtaining one.   The patient may need specialty follow up if the symptoms continue, in spite of conservative treatment and management, for further workup, evaluation, consultation and treatment as clinically indicated and appropriate.   Patient/parent/caregiver verbalized understanding and agreement of plan as discussed.  All questions were addressed during visit.  Please see discharge instructions below for further details of plan.  Discharge Instructions:   Discharge Instructions      Please begin Bactrim 1 tablet twice daily for the next 5 days to resolve the infection in your left foot.  If you find that it is not completely resolved after 5 days, I did provide with a 7-day prescription.  I agree that soaking your foot in Epsom salt will be helpful.  I do not believe that it needs to be bandaged however if you find that it opens and it drains and your socks are sticking to it, please consider covering  with a Band-Aid when you are walking.  Please follow-up if you have not had complete resolution of the redness, pain after 7 days of Bactrim.  Thank you for visiting urgent care.    This office note has been dictated using Teaching laboratory technician.  Unfortunately, and despite my best efforts, this method of dictation can sometimes lead to occasional typographical or grammatical errors.  I apologize in advance if this occurs.     Theadora Rama Scales, PA-C 11/15/21 1725

## 2021-11-15 NOTE — Discharge Instructions (Signed)
Please begin Bactrim 1 tablet twice daily for the next 5 days to resolve the infection in your left foot.  If you find that it is not completely resolved after 5 days, I did provide with a 7-day prescription.  I agree that soaking your foot in Epsom salt will be helpful.  I do not believe that it needs to be bandaged however if you find that it opens and it drains and your socks are sticking to it, please consider covering with a Band-Aid when you are walking.  Please follow-up if you have not had complete resolution of the redness, pain after 7 days of Bactrim.  Thank you for visiting urgent care.

## 2021-11-28 DIAGNOSIS — J029 Acute pharyngitis, unspecified: Secondary | ICD-10-CM | POA: Diagnosis not present

## 2021-12-03 DIAGNOSIS — Z419 Encounter for procedure for purposes other than remedying health state, unspecified: Secondary | ICD-10-CM | POA: Diagnosis not present

## 2021-12-21 DIAGNOSIS — R21 Rash and other nonspecific skin eruption: Secondary | ICD-10-CM | POA: Diagnosis not present

## 2021-12-28 DIAGNOSIS — J020 Streptococcal pharyngitis: Secondary | ICD-10-CM | POA: Diagnosis not present

## 2021-12-28 DIAGNOSIS — R509 Fever, unspecified: Secondary | ICD-10-CM | POA: Diagnosis not present

## 2021-12-28 DIAGNOSIS — M791 Myalgia, unspecified site: Secondary | ICD-10-CM | POA: Diagnosis not present

## 2022-01-03 DIAGNOSIS — Z419 Encounter for procedure for purposes other than remedying health state, unspecified: Secondary | ICD-10-CM | POA: Diagnosis not present

## 2022-01-11 DIAGNOSIS — E538 Deficiency of other specified B group vitamins: Secondary | ICD-10-CM | POA: Diagnosis not present

## 2022-01-31 DIAGNOSIS — J029 Acute pharyngitis, unspecified: Secondary | ICD-10-CM | POA: Diagnosis not present

## 2022-02-01 DIAGNOSIS — N390 Urinary tract infection, site not specified: Secondary | ICD-10-CM | POA: Diagnosis not present

## 2022-02-01 DIAGNOSIS — N912 Amenorrhea, unspecified: Secondary | ICD-10-CM | POA: Diagnosis not present

## 2022-02-03 DIAGNOSIS — Z419 Encounter for procedure for purposes other than remedying health state, unspecified: Secondary | ICD-10-CM | POA: Diagnosis not present

## 2022-02-15 DIAGNOSIS — E538 Deficiency of other specified B group vitamins: Secondary | ICD-10-CM | POA: Diagnosis not present

## 2022-02-20 ENCOUNTER — Inpatient Hospital Stay (HOSPITAL_COMMUNITY)
Admission: AD | Admit: 2022-02-20 | Discharge: 2022-02-20 | Disposition: A | Discharge status: Home / Self Care | Payer: Medicaid Other | Attending: Obstetrics and Gynecology | Admitting: Obstetrics and Gynecology

## 2022-02-20 ENCOUNTER — Encounter (HOSPITAL_COMMUNITY): Payer: Self-pay

## 2022-02-20 ENCOUNTER — Inpatient Hospital Stay (HOSPITAL_COMMUNITY): Payer: Medicaid Other

## 2022-02-20 DIAGNOSIS — Z3A01 Less than 8 weeks gestation of pregnancy: Secondary | ICD-10-CM | POA: Insufficient documentation

## 2022-02-20 DIAGNOSIS — O26891 Other specified pregnancy related conditions, first trimester: Secondary | ICD-10-CM | POA: Diagnosis not present

## 2022-02-20 DIAGNOSIS — O2241 Hemorrhoids in pregnancy, first trimester: Secondary | ICD-10-CM | POA: Insufficient documentation

## 2022-02-20 DIAGNOSIS — R42 Dizziness and giddiness: Secondary | ICD-10-CM | POA: Insufficient documentation

## 2022-02-20 DIAGNOSIS — O99611 Diseases of the digestive system complicating pregnancy, first trimester: Secondary | ICD-10-CM | POA: Insufficient documentation

## 2022-02-20 DIAGNOSIS — O99351 Diseases of the nervous system complicating pregnancy, first trimester: Secondary | ICD-10-CM | POA: Insufficient documentation

## 2022-02-20 DIAGNOSIS — Z87440 Personal history of urinary (tract) infections: Secondary | ICD-10-CM | POA: Insufficient documentation

## 2022-02-20 DIAGNOSIS — O209 Hemorrhage in early pregnancy, unspecified: Secondary | ICD-10-CM | POA: Diagnosis present

## 2022-02-20 DIAGNOSIS — M549 Dorsalgia, unspecified: Secondary | ICD-10-CM | POA: Diagnosis not present

## 2022-02-20 DIAGNOSIS — R102 Pelvic and perineal pain: Secondary | ICD-10-CM | POA: Insufficient documentation

## 2022-02-20 DIAGNOSIS — O99891 Other specified diseases and conditions complicating pregnancy: Secondary | ICD-10-CM | POA: Diagnosis present

## 2022-02-20 DIAGNOSIS — G44209 Tension-type headache, unspecified, not intractable: Secondary | ICD-10-CM

## 2022-02-20 DIAGNOSIS — K649 Unspecified hemorrhoids: Secondary | ICD-10-CM

## 2022-02-20 DIAGNOSIS — Z3490 Encounter for supervision of normal pregnancy, unspecified, unspecified trimester: Secondary | ICD-10-CM

## 2022-02-20 LAB — CBC
HCT: 32.3 % — ABNORMAL LOW (ref 36.0–46.0)
Hemoglobin: 11.3 g/dL — ABNORMAL LOW (ref 12.0–15.0)
MCH: 32.3 pg (ref 26.0–34.0)
MCHC: 35 g/dL (ref 30.0–36.0)
MCV: 92.3 fL (ref 80.0–100.0)
Platelets: 253 K/uL (ref 150–400)
RBC: 3.5 MIL/uL — ABNORMAL LOW (ref 3.87–5.11)
RDW: 12.3 % (ref 11.5–15.5)
WBC: 6.9 K/uL (ref 4.0–10.5)
nRBC: 0 % (ref 0.0–0.2)

## 2022-02-20 LAB — POCT PREGNANCY, URINE: Preg Test, Ur: POSITIVE — AB

## 2022-02-20 LAB — URINALYSIS, ROUTINE W REFLEX MICROSCOPIC
Bilirubin Urine: NEGATIVE
Glucose, UA: NEGATIVE mg/dL
Hgb urine dipstick: NEGATIVE
Ketones, ur: NEGATIVE mg/dL
Nitrite: NEGATIVE
Protein, ur: NEGATIVE mg/dL
Specific Gravity, Urine: 1.006 (ref 1.005–1.030)
pH: 6 (ref 5.0–8.0)

## 2022-02-20 LAB — WET PREP, GENITAL
Clue Cells Wet Prep HPF POC: NONE SEEN
Sperm: NONE SEEN
Trich, Wet Prep: NONE SEEN
WBC, Wet Prep HPF POC: <10 (ref <10)
Yeast Wet Prep HPF POC: NONE SEEN

## 2022-02-20 LAB — HCG, QUANTITATIVE, PREGNANCY: hCG, Beta Chain, Quant, S: 28336 m[IU]/mL — ABNORMAL HIGH (ref <5)

## 2022-02-20 MED ORDER — ACETAMINOPHEN-CAFFEINE 500-65 MG PO TABS
2.0000 | ORAL_TABLET | Freq: Once | ORAL | Status: AC
  Administered 2022-02-20: 2 via ORAL
  Filled 2022-02-20: qty 2

## 2022-02-20 NOTE — Discharge Instructions (Signed)
Safe Medications in Pregnancy    Acne: Benzoyl Peroxide Salicylic Acid  Backache/Headache: Tylenol: 2 regular strength every 4 hours OR              2 Extra strength every 6 hours  Colds/Coughs/Allergies: Benadryl (alcohol free) 25 mg every 6 hours as needed Breath right strips Claritin Cepacol throat lozenges Chloraseptic throat spray Cold-Eeze- up to three times per day Cough drops, alcohol free Flonase (by prescription only) Guaifenesin Mucinex Robitussin DM (plain only, alcohol free) Saline nasal spray/drops Sudafed (pseudoephedrine) & Actifed ** use only after [redacted] weeks gestation and if you do not have high blood pressure Tylenol Vicks Vaporub Zinc lozenges Zyrtec   Constipation: Colace Ducolax suppositories Fleet enema Glycerin suppositories Metamucil Milk of magnesia Miralax Senokot Smooth move tea  Diarrhea: Kaopectate Imodium A-D  *NO pepto Bismol  Hemorrhoids: Anusol Anusol HC Preparation H Tucks  Indigestion: Tums Maalox Mylanta Zantac  Pepcid  Insomnia: Benadryl (alcohol free) 25mg  every 6 hours as needed Tylenol PM Unisom, no Gelcaps  Leg Cramps: Tums MagGel  Nausea/Vomiting:  Bonine Dramamine Emetrol Ginger extract Sea bands Meclizine  Nausea medication to take during pregnancy:  Unisom (doxylamine succinate 25 mg tablets) Take one tablet daily at bedtime. If symptoms are not adequately controlled, the dose can be increased to a maximum recommended dose of two tablets daily (1/2 tablet in the morning, 1/2 tablet mid-afternoon and one at bedtime). Vitamin B6 100mg  tablets. Take one tablet twice a day (up to 200 mg per day).  Skin Rashes: Aveeno products Benadryl cream or 25mg  every 6 hours as needed Calamine Lotion 1% cortisone cream  Yeast infection: Gyne-lotrimin 7 Monistat 7   **If taking multiple medications, please check labels to avoid duplicating the same active ingredients **take  medication as directed on the label ** Do not exceed 4000 mg of tylenol in 24 hours **Do not take medications that contain aspirin or ibuprofen     [place] [facility] [employer] at [facility]             [address] [place], [place] [address] [place] [facility] [employer] at [place], [place], [place], [place], [address] [phone]  Center for [facility] at [place], [place], [place], [place], [address] [phone]  Center for [facility] at [place], [place], [place], [place], [address] [phone]  Center for [place] at [facility]                                 [place], [place], [place], [address] [phone]  Center for [place] at [facility]  [address] [place], [place], [address] [phone]  Center for [facility] at [facility] [address], [place], [place], [address]                              [facility] [address], [place], [place], [address] [phone]  [facility]         Phone: [phone]  [facility]      Phone: [phone]   [facility]      Phone: [phone]  [facility]         Phone: [phone]   [facility] Department-Maternity    Phone: [phone]  [facility]      Phone: [phone]  [facility]     Phone: [phone]  [facility]        Phone: [phone]  [facility] ([address] [place]) [phone]  [facility]      Phone: [phone]

## 2022-02-20 NOTE — MAU Provider Note (Addendum)
History     CSN: [id number]  Arrival date and time: 02/20/22 1913   Event Date/Time   First Provider Initiated Contact with Patient 02/20/22 2049      26 y.o. G2P1001 @[redacted]w[redacted]d  by sure LMP presenting with HA, dizziness, back pain, and LAP. Sx started yesterday. Reports cramping LAP, worse on the left. Rates pain 5/10. Denies urinary sx but reports being treated for UTI 2 weeks ago. Denies VB. HA is frontal. Reports vertigo with standing. Denies syncope. States she is eating and drinking well. Also reports bleeding with BMs and hemorrhoid.    OB History     Gravida  2   Para  1   Term  1   Preterm      AB      Living  1      SAB      IAB      Ectopic      Multiple      Live Births  1           Past Medical History:  Diagnosis Date   Anxiety     Past Surgical History:  Procedure Laterality Date   TONSILLECTOMY     TONSILLECTOMY AND ADENOIDECTOMY      History reviewed. No pertinent family history.  Social History   Tobacco Use   Smoking status: Never   Smokeless tobacco: Never  Vaping Use   Vaping Use: Never used  Substance Use Topics   Alcohol use: No   Drug use: No    Allergies:  Allergies  Allergen Reactions   Codeine     No medications prior to admission.    Review of Systems  Constitutional:  Negative for fever.  Eyes:  Positive for visual disturbance.  Gastrointestinal:  Positive for abdominal pain.  Genitourinary:  Negative for dysuria, frequency, urgency and vaginal bleeding.  Musculoskeletal:  Positive for back pain.  Neurological:  Positive for dizziness and headaches. Negative for syncope.   Physical Exam   Blood pressure 109/69, pulse 82, temperature 98.4 F (36.9 C), temperature source Oral, resp. rate 18, height 5\' 2"  (1.575 m), weight 57.9 kg, last menstrual period 01/11/2022, SpO2 100 %. Orthostatic VS for the past 24 hrs:  BP- Lying Pulse- Lying BP- Sitting Pulse- Sitting BP- Standing at 0 minutes Pulse- Standing  at 0 minutes  02/20/22 2049 100/61 88 100/60 85 110/78 100    Physical Exam Vitals and nursing note reviewed.  Constitutional:      General: She is not in acute distress.    Appearance: Normal appearance.  HENT:     Head: Normocephalic and atraumatic.  Cardiovascular:     Rate and Rhythm: Normal rate.  Pulmonary:     Effort: Pulmonary effort is normal. No respiratory distress.  Abdominal:     General: There is no distension.     Palpations: Abdomen is soft. There is no mass.     Tenderness: There is no abdominal tenderness. There is no right CVA tenderness, left CVA tenderness, guarding or rebound.     Hernia: No hernia is present.  Musculoskeletal:        General: Normal range of motion.     Cervical back: Normal and normal range of motion.     Thoracic back: Normal.     Lumbar back: Normal.  Skin:    General: Skin is warm and dry.  Neurological:     General: No focal deficit present.     Mental Status: She is alert  and oriented to person, place, and time.  Psychiatric:        Mood and Affect: Mood normal.        Behavior: Behavior normal.    Results for orders placed or performed during the hospital encounter of 02/20/22 (from the past 24 hour(s))  Pregnancy, urine POC     Status: Abnormal   Collection Time: 02/20/22  7:38 PM  Result Value Ref Range   Preg Test, Ur POSITIVE (A) NEGATIVE  Urinalysis, Routine w reflex microscopic     Status: Abnormal   Collection Time: 02/20/22  8:01 PM  Result Value Ref Range   Color, Urine STRAW (A) YELLOW   APPearance CLEAR CLEAR   Specific Gravity, Urine 1.006 1.005 - 1.030   pH 6.0 5.0 - 8.0   Glucose, UA NEGATIVE NEGATIVE mg/dL   Hgb urine dipstick NEGATIVE NEGATIVE   Bilirubin Urine NEGATIVE NEGATIVE   Ketones, ur NEGATIVE NEGATIVE mg/dL   Protein, ur NEGATIVE NEGATIVE mg/dL   Nitrite NEGATIVE NEGATIVE   Leukocytes,Ua TRACE (A) NEGATIVE   RBC / HPF 0-5 0 - 5 RBC/hpf   WBC, UA 0-5 0 - 5 WBC/hpf   Bacteria, UA RARE (A)  NONE SEEN   Squamous Epithelial / LPF 0-5 0 - 5   Mucus PRESENT   Wet prep, genital     Status: None   Collection Time: 02/20/22  9:34 PM   Specimen: PATH Cytology Cervicovaginal Ancillary Only  Result Value Ref Range   Yeast Wet Prep HPF POC NONE SEEN NONE SEEN   Trich, Wet Prep NONE SEEN NONE SEEN   Clue Cells Wet Prep HPF POC NONE SEEN NONE SEEN   WBC, Wet Prep HPF POC <10 <10   Sperm NONE SEEN   CBC     Status: Abnormal   Collection Time: 02/20/22  9:40 PM  Result Value Ref Range   WBC 6.9 4.0 - 10.5 K/uL   RBC 3.50 (L) 3.87 - 5.11 MIL/uL   Hemoglobin 11.3 (L) 12.0 - 15.0 g/dL   HCT 02.6 (L) 37.8 - 58.8 %   MCV 92.3 80.0 - 100.0 fL   MCH 32.3 26.0 - 34.0 pg   MCHC 35.0 30.0 - 36.0 g/dL   RDW 50.2 77.4 - 12.8 %   Platelets 253 150 - 400 K/uL   nRBC 0.0 0.0 - 0.2 %  hCG, quantitative, pregnancy     Status: Abnormal   Collection Time: 02/20/22  9:40 PM  Result Value Ref Range   hCG, Beta Chain, Quant, S 28,336 (H) <5 mIU/mL   US OB LESS THAN 14 WEEKS WITH OB TRANSVAGINAL  Result Date: 02/20/2022 CLINICAL DATA:  Pregnant patient with pain. Gestational age based on LMP 5 weeks 5 days. EXAM: OBSTETRIC <14 WK [place] AND TRANSVAGINAL OB US TECHNIQUE: Both transabdominal and transvaginal ultrasound examinations were performed for complete evaluation of the gestation as well as the maternal uterus, adnexal regions, and pelvic cul-de-sac. Transvaginal technique was performed to assess early pregnancy. COMPARISON:  None Available. FINDINGS: Intrauterine gestational sac: Single Yolk sac:  Visualized. Embryo:  Not Visualized. Cardiac Activity: Not Visualized. MSD: 12.3 mm   6 w   0 d Subchorionic hemorrhage:  None visualized. Maternal uterus/adnexae: Anteverted uterus. Both ovaries are visualized and normal. No adnexal mass. No pelvic free fluid. IMPRESSION: Single intrauterine gestational sac with a yolk sac, but no fetal pole or cardiac activity. Recommend trending of beta HCG, and follow-up  ultrasound in 7-10 days as clinically indicated. Electronically Signed  By: Keith Rake M.D.   On: 02/20/2022 22:32    MAU Course  Procedures Excedrin  MDM Labs and [place] ordered and reviewed. Early IUP seen on [place]. HA resolved with medication. No signs of UTI, culture sent. Discussed relief measures for hemorrhoids and avoidance of constipation. Stable for discharge home.   Assessment and Plan   1. Early stage of pregnancy   2. Hemorrhoids, unspecified hemorrhoid type   3. Acute non intractable tension-type headache    Discharge home [facility] up at [facility] or provider of choice to start care SAB precautions  Allergies as of 02/20/2022       Reactions   Codeine         Medication List     STOP taking these medications    IRON PO       TAKE these medications    PNV PO Take by mouth.       Julianne Handler, CNM 02/20/2022, 11:10 PM

## 2022-02-20 NOTE — MAU Note (Signed)
.  Tricia Allen is a 26 y.o. at Unknown here in MAU reporting: went to [facility] urgent care and was told to be seen in MAU, pt reports constant HA, dizziness,  back pain since yesterday, and newly onset intermittent ABD sharp cramping, more on left side. Pt taken Tylenol yesterday, no relief. Pt reports with a bowel movement she has pain and bleeding when trying to pass stool. Pt denies VB, LOF. Pt took a home preg test LMP: 01/11/2022  Onset of complaint: yesterday  Pain score: 6/10 HA, 5/10 back and ABD Vitals:   02/20/22 2016  BP: 101/64  Pulse: 97  Resp: 18  Temp: 98.4 F (36.9 C)  SpO2: 99%      Lab orders placed from triage:  UA

## 2022-02-21 LAB — GC/CHLAMYDIA PROBE AMP (~~LOC~~) NOT AT ARMC
Chlamydia: NEGATIVE
Comment: NEGATIVE
Comment: NORMAL
Neisseria Gonorrhea: NEGATIVE

## 2022-02-22 ENCOUNTER — Encounter: Payer: Self-pay | Admitting: General Practice

## 2022-02-22 LAB — CULTURE, OB URINE: Culture: NO GROWTH

## 2022-02-23 DIAGNOSIS — Z20822 Contact with and (suspected) exposure to covid-19: Secondary | ICD-10-CM | POA: Diagnosis not present

## 2022-02-27 ENCOUNTER — Other Ambulatory Visit: Payer: Self-pay

## 2022-02-27 DIAGNOSIS — O26899 Other specified pregnancy related conditions, unspecified trimester: Secondary | ICD-10-CM

## 2022-03-01 ENCOUNTER — Ambulatory Visit: Payer: Medicaid Other

## 2022-03-01 ENCOUNTER — Ambulatory Visit (HOSPITAL_BASED_OUTPATIENT_CLINIC_OR_DEPARTMENT_OTHER)
Admission: RE | Admit: 2022-03-01 | Discharge: 2022-03-01 | Disposition: A | Discharge status: Home / Self Care | Payer: Medicaid Other | Source: Ambulatory Visit | Attending: Family Medicine | Admitting: Family Medicine

## 2022-03-01 ENCOUNTER — Other Ambulatory Visit: Payer: Self-pay | Admitting: Family Medicine

## 2022-03-01 DIAGNOSIS — O26899 Other specified pregnancy related conditions, unspecified trimester: Secondary | ICD-10-CM

## 2022-03-01 DIAGNOSIS — R109 Unspecified abdominal pain: Secondary | ICD-10-CM | POA: Insufficient documentation

## 2022-03-01 NOTE — Addendum Note (Signed)
Addended by: Phill Myron on: 03/01/2022 04:11 PM   Modules accepted: Orders

## 2022-03-01 NOTE — Progress Notes (Signed)
Patient was seen in MAU and needs follow up HCG and imaging. Kathrene Alu RN

## 2022-03-01 NOTE — Addendum Note (Signed)
Addended by: Richel Millspaugh L on: 03/01/2022 04:11 PM   Modules accepted: Orders  

## 2022-03-01 NOTE — Progress Notes (Signed)
Chaperone was offered for abdominal Ultrasound and patient declined

## 2022-03-02 LAB — BETA HCG QUANT (REF LAB): hCG Quant: 73990 m[IU]/mL

## 2022-03-05 DIAGNOSIS — Z419 Encounter for procedure for purposes other than remedying health state, unspecified: Secondary | ICD-10-CM | POA: Diagnosis not present

## 2022-03-22 DIAGNOSIS — E538 Deficiency of other specified B group vitamins: Secondary | ICD-10-CM | POA: Diagnosis not present

## 2022-03-23 DIAGNOSIS — W273XXA Contact with needle (sewing), initial encounter: Secondary | ICD-10-CM | POA: Diagnosis not present

## 2022-03-23 DIAGNOSIS — S61239A Puncture wound without foreign body of unspecified finger without damage to nail, initial encounter: Secondary | ICD-10-CM | POA: Diagnosis not present

## 2022-03-27 ENCOUNTER — Encounter: Payer: Self-pay | Admitting: General Practice

## 2022-03-27 ENCOUNTER — Encounter: Payer: Self-pay | Admitting: Obstetrics and Gynecology

## 2022-03-27 ENCOUNTER — Ambulatory Visit (INDEPENDENT_AMBULATORY_CARE_PROVIDER_SITE_OTHER): Payer: BC Managed Care – PPO | Admitting: Obstetrics and Gynecology

## 2022-03-27 ENCOUNTER — Other Ambulatory Visit (HOSPITAL_COMMUNITY)
Admission: RE | Admit: 2022-03-27 | Discharge: 2022-03-27 | Disposition: A | Discharge status: Home / Self Care | Payer: BC Managed Care – PPO | Source: Ambulatory Visit | Attending: Family Medicine | Admitting: Family Medicine

## 2022-03-27 VITALS — BP 103/66 | HR 87 | Wt 133.0 lb

## 2022-03-27 DIAGNOSIS — Z8759 Personal history of other complications of pregnancy, childbirth and the puerperium: Secondary | ICD-10-CM | POA: Diagnosis not present

## 2022-03-27 DIAGNOSIS — Z3480 Encounter for supervision of other normal pregnancy, unspecified trimester: Secondary | ICD-10-CM | POA: Insufficient documentation

## 2022-03-27 DIAGNOSIS — Z3481 Encounter for supervision of other normal pregnancy, first trimester: Secondary | ICD-10-CM | POA: Diagnosis present

## 2022-03-27 DIAGNOSIS — O09891 Supervision of other high risk pregnancies, first trimester: Secondary | ICD-10-CM | POA: Diagnosis not present

## 2022-03-27 DIAGNOSIS — Z3A10 10 weeks gestation of pregnancy: Secondary | ICD-10-CM | POA: Diagnosis not present

## 2022-03-27 DIAGNOSIS — Z2839 Other underimmunization status: Secondary | ICD-10-CM

## 2022-03-27 MED ORDER — DOXYLAMINE-PYRIDOXINE 10-10 MG PO TBEC: 2.0000 | DELAYED_RELEASE_TABLET | Freq: Every day | ORAL | 5 refills | Status: DC

## 2022-03-27 MED ORDER — HYDROCORTISONE (PERIANAL) 2.5 % EX CREA: TOPICAL_CREAM | Freq: Two times a day (BID) | CUTANEOUS | 2 refills | Status: DC

## 2022-03-27 MED ORDER — ASPIRIN 81 MG PO TBEC: 81.0000 mg | DELAYED_RELEASE_TABLET | Freq: Every day | ORAL | 2 refills | Status: DC

## 2022-03-27 NOTE — Progress Notes (Signed)
Subjective:  Tricia Allen is a G2P1001 [redacted]w[redacted]d being seen today for her first obstetrical visit.  Her obstetrical history is significant for pre-eclampsia. Patient does intend to breast feed. Pregnancy history fully reviewed. Patient reports fatigue, nausea, and cramping and hemorrhoids .  Last pregnancy notable for pre-eclampsia in third tri, IOL at 39+ weeks with SVD. 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   Current pregnancy with daily nausea, occasional vomiting.  Reports having issues of dizziness, lightheadedness, suspect low blood sugar.  Does not currently check her blood pressures at home.  Did not have an issue with diabetes in her last pregnancy.  Intends for this to be the last pregnancy.  Morning of able to find out gender, and risk of disease in this pregnancy as well.  States when her son was diagnosed with Smith-Magenis, saw genetic counselor and was told everything was negative.   BP 103/66   Pulse 87   Wt 133 lb (60.3 kg)   LMP 01/11/2022 (Exact Date)   BMI 24.33 kg/m   HISTORY: OB History  Gravida Para Term Preterm AB Living  2 1 1     1   SAB IAB Ectopic Multiple Live Births          1    # Outcome Date GA Lbr Len/2nd Weight Sex Delivery Anes PTL Lv  2 Current           1 Term 05/08/17 [redacted]w[redacted]d    Vag-Spont   LIV    Past Medical History:  Diagnosis Date   Anxiety     Past Surgical History:  Procedure Laterality Date   TONSILLECTOMY     TONSILLECTOMY AND ADENOIDECTOMY      Family History  Problem Relation Age of Onset   Hypertension Father      Exam  BP 103/66   Pulse 87   Wt 133 lb (60.3 kg)   LMP 01/11/2022 (Exact Date)   BMI 24.33 kg/m   Chaperone present during exam  CONSTITUTIONAL: Well-developed, well-nourished female in no acute distress.  HENT:  Normocephalic, atraumatic, External right and left ear normal. Oropharynx is clear and moist EYES: Conjunctivae and EOM are normal. Pupils are equal,  round, and reactive to light. No scleral icterus.  CARDIOVASCULAR: Normal heart rate noted, regular rhythm RESPIRATORY: Clear to auscultation bilaterally. Effort and breath sounds normal, no problems with respiration noted. BREASTS: Symmetric in size. No masses, skin changes, nipple drainage, or lymphadenopathy, mild tenderness. ABDOMEN: Soft, normal bowel sounds, no distention noted.  No tenderness, rebound or guarding.  PELVIC: Normal appearing external genitalia; normal appearing vaginal mucosa and cervix. No abnormal discharge noted. Normal uterine size, no other palpable masses, no uterine or adnexal tenderness. MUSCULOSKELETAL: Normal range of motion. No tenderness.  No cyanosis, clubbing, or edema.  2+ distal pulses. SKIN: Skin is warm and dry. No rash noted. Not diaphoretic. No erythema. No pallor. NEUROLOGIC: Alert and oriented to person, place, and time. Normal reflexes, muscle tone coordination. No cranial nerve deficit noted. PSYCHIATRIC: Normal mood and affect. Normal behavior. Normal judgment and thought content.    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 Encounter for supervision of other normal pregnancy in first trimester Labs collected today.  Reviewed expected changes in pregnancy.  Discussed remedies  for hemorrhoids, nausea, lightheadedness.  Rx sent to pharmacy.  - Culture, OB Urine - [facility] AMB BABYSCRIPTS OPT IN - CBC/D/Plt+RPR+Rh+ABO+RubIgG... - GC/Chlamydia probe amp ([place])not at [facility] - hydrocortisone (ANUSOL-HC) 2.5 % rectal cream; Place rectally 2 (two) times daily.  Dispense: 30 g; Refill: 2 - Doxylamine-Pyridoxine (DICLEGIS) 10-10 MG TBEC; Take 2 tablets by mouth at bedtime. If symptoms persist, add one tablet in the morning and one in the afternoon  Dispense:  100 tablet; Refill: 5 - [place] MFM OB DETAIL +14 WK; Future - aspirin EC 81 MG tablet; Take 1 tablet (81 mg total) by mouth daily. Take after 12 weeks for prevention of preeclampsia later in pregnancy  Dispense: 300 tablet; Refill: 2 - PANORAMA PRENATAL TEST FULL PANEL - HORIZON CUSTOM  2. [redacted] weeks gestation of pregnancy Labs collected - Culture, OB Urine - CH[facility] AMB BABYSCRIPTS OPT IN - CBC/D/Plt+RPR+Rh+ABO+RubIgG... - GC/Chlamydia probe amp ([place])not at [facility] - [place] MFM OB DETAIL +14 WK; Future  3. Supervision of other normal pregnancy, antepartum Noted in ultrasound referral of prior diagnoses.  4. History of pre-eclampsia Recommend labs, and aspirin History noted in MFM referral for ultrasound - Comprehensive metabolic panel - Protein / creatinine ratio, urine - aspirin EC 81 MG tablet; Take 1 tablet (81 mg total) by mouth daily. Take after 12 weeks for prevention of preeclampsia later in pregnancy  Dispense: 300 tablet; Refill: 2    Initial labs obtained Continue prenatal vitamins Reviewed n/v relief measures and warning s/s to report Reviewed recommended weight gain based on pre-gravid BMI Encouraged well-balanced diet Genetic & carrier screening discussed: requests Panorama, requests Horizon  Ultrasound discussed; fetal survey: requested [place] completed> form faxed if has or is planning to apply for medicaid The nature of [place] for [employer] with multiple MDs and other Advanced Practice Providers was explained to patient; also emphasized that fellows, residents, and students are part of our team. Will need home BP cuff  Indications for ASA therapy (per uptodate) One of the following: HX OF PRE-ECLAMPSIA - ASA given   Indications for early GDM screening  Routine 1st tri A1C   Darliss Cheney, MD Minimally Invasive Gynecologic Surgery 03/27/22

## 2022-03-27 NOTE — Progress Notes (Signed)
DATING AND VIABILITY SONOGRAM   Tricia Allen is a 26 y.o. year old G2P1001 with LMP Patient's last menstrual period was 01/11/2022 (exact date). which would correlate to  [redacted]w[redacted]d weeks gestation.  She has regular menstrual cycles.   She is here today for a confirmatory initial sonogram.    GESTATION: SINGLETON     FETAL ACTIVITY:          Heart rate         180 bpm          The fetus is active.    ADNEXA: The ovaries are normal.   GESTATIONAL AGE AND  BIOMETRICS:  Gestational criteria: Estimated Date of Delivery: 10/18/22 by LMP now at [redacted]w[redacted]d  Previous Scans:1      CROWN RUMP LENGTH           3.68 cm         10-4 weeks   3.28 cm 10-3 weeks                                                                           AVERAGE EGA(BY THIS SCAN):  10-4 weeks  WORKING EDD( LMP ):  10/18/2022     TECHNICIAN COMMENTS: Patient informed that the ultrasound is considered a limited obstetric ultrasound and is not intended to be a complete ultrasound exam.  Patient also informed that the ultrasound is not being completed with the intent of assessing for fetal or placental anomalies or any pelvic abnormalities. Explained that the purpose of today's ultrasound is to assess for fetal heart rate.  Patient acknowledges the purpose of the exam and the limitations of the study.      Kathrene Alu 03/27/2022 9:45 AM

## 2022-03-28 LAB — GC/CHLAMYDIA PROBE AMP (~~LOC~~) NOT AT ARMC
Chlamydia: NEGATIVE
Comment: NEGATIVE
Comment: NORMAL
Neisseria Gonorrhea: NEGATIVE

## 2022-03-29 ENCOUNTER — Encounter: Payer: Self-pay | Admitting: Obstetrics and Gynecology

## 2022-03-29 DIAGNOSIS — Z2839 Other underimmunization status: Secondary | ICD-10-CM | POA: Insufficient documentation

## 2022-03-29 LAB — CBC/D/PLT+RPR+RH+ABO+RUBIGG...
Antibody Screen: NEGATIVE
Basophils Absolute: 0 10*3/uL (ref 0.0–0.2)
Basos: 0 %
EOS (ABSOLUTE): 0 10*3/uL (ref 0.0–0.4)
Eos: 0 %
HCV Ab: NONREACTIVE
HIV Screen 4th Generation wRfx: NONREACTIVE
Hematocrit: 36.9 % (ref 34.0–46.6)
Hemoglobin: 12.7 g/dL (ref 11.1–15.9)
Hepatitis B Surface Ag: NEGATIVE
Immature Grans (Abs): 0 10*3/uL (ref 0.0–0.1)
Immature Granulocytes: 0 %
Lymphocytes Absolute: 1.7 10*3/uL (ref 0.7–3.1)
Lymphs: 26 %
MCH: 32 pg (ref 26.6–33.0)
MCHC: 34.4 g/dL (ref 31.5–35.7)
MCV: 93 fL (ref 79–97)
Monocytes Absolute: 0.4 10*3/uL (ref 0.1–0.9)
Monocytes: 6 %
Neutrophils Absolute: 4.5 10*3/uL (ref 1.4–7.0)
Neutrophils: 68 %
Platelets: 267 10*3/uL (ref 150–450)
RBC: 3.97 x10E6/uL (ref 3.77–5.28)
RDW: 12.2 % (ref 11.7–15.4)
RPR Ser Ql: NONREACTIVE
Rh Factor: POSITIVE
Rubella Antibodies, IGG: <0.9 index — ABNORMAL LOW (ref >0.99)
WBC: 6.7 10*3/uL (ref 3.4–10.8)

## 2022-03-29 LAB — PROTEIN / CREATININE RATIO, URINE
Creatinine, Urine: 60.2 mg/dL
Protein, Ur: 6.7 mg/dL
Protein/Creat Ratio: 111 mg/g creat (ref 0–200)

## 2022-03-29 LAB — COMPREHENSIVE METABOLIC PANEL
ALT: 11 IU/L (ref 0–32)
AST: 13 IU/L (ref 0–40)
Albumin/Globulin Ratio: 2.2 (ref 1.2–2.2)
Albumin: 4.8 g/dL (ref 4.0–5.0)
Alkaline Phosphatase: 46 IU/L (ref 44–121)
BUN/Creatinine Ratio: 13 (ref 9–23)
BUN: 8 mg/dL (ref 6–20)
Bilirubin Total: 0.4 mg/dL (ref 0.0–1.2)
CO2: 20 mmol/L (ref 20–29)
Calcium: 10.4 mg/dL — ABNORMAL HIGH (ref 8.7–10.2)
Chloride: 100 mmol/L (ref 96–106)
Creatinine, Ser: 0.64 mg/dL (ref 0.57–1.00)
Globulin, Total: 2.2 g/dL (ref 1.5–4.5)
Glucose: 82 mg/dL (ref 70–99)
Potassium: 4.7 mmol/L (ref 3.5–5.2)
Sodium: 136 mmol/L (ref 134–144)
Total Protein: 7 g/dL (ref 6.0–8.5)
eGFR: 126 mL/min/{1.73_m2} (ref >59)

## 2022-03-29 LAB — URINE CULTURE, OB REFLEX: Organism ID, Bacteria: NO GROWTH

## 2022-03-29 LAB — HCV INTERPRETATION

## 2022-03-29 LAB — CULTURE, OB URINE

## 2022-04-03 LAB — HORIZON CUSTOM: REPORT SUMMARY: NEGATIVE

## 2022-04-05 DIAGNOSIS — Z419 Encounter for procedure for purposes other than remedying health state, unspecified: Secondary | ICD-10-CM | POA: Diagnosis not present

## 2022-04-05 LAB — PANORAMA PRENATAL TEST FULL PANEL:PANORAMA TEST PLUS 5 ADDITIONAL MICRODELETIONS: FETAL FRACTION: 4.5

## 2022-04-12 ENCOUNTER — Other Ambulatory Visit (INDEPENDENT_AMBULATORY_CARE_PROVIDER_SITE_OTHER): Payer: BC Managed Care – PPO

## 2022-04-12 ENCOUNTER — Telehealth: Payer: Self-pay

## 2022-04-12 DIAGNOSIS — Z1379 Encounter for other screening for genetic and chromosomal anomalies: Secondary | ICD-10-CM

## 2022-04-12 DIAGNOSIS — Z3490 Encounter for supervision of normal pregnancy, unspecified, unspecified trimester: Secondary | ICD-10-CM

## 2022-04-12 DIAGNOSIS — O09899 Supervision of other high risk pregnancies, unspecified trimester: Secondary | ICD-10-CM

## 2022-04-12 DIAGNOSIS — Z3480 Encounter for supervision of other normal pregnancy, unspecified trimester: Secondary | ICD-10-CM

## 2022-04-12 DIAGNOSIS — Z8759 Personal history of other complications of pregnancy, childbirth and the puerperium: Secondary | ICD-10-CM

## 2022-04-12 DIAGNOSIS — Z3A13 13 weeks gestation of pregnancy: Secondary | ICD-10-CM

## 2022-04-12 NOTE — Progress Notes (Signed)
Patient presents to repeat Panorama lab. Patient was sent to the lab to have labs drawn. Eileen Kangas l Rachell Druckenmiller, CMA

## 2022-04-12 NOTE — Telephone Encounter (Signed)
Left message for patient to call [facility] back to reschedule her Detail, Anatomy ultrasound on 12/20 - due to our office being closed that afternoon.

## 2022-04-17 ENCOUNTER — Encounter: Payer: Self-pay | Admitting: General Practice

## 2022-04-19 ENCOUNTER — Encounter: Payer: Self-pay | Admitting: Obstetrics and Gynecology

## 2022-04-19 LAB — PANORAMA PRENATAL TEST FULL PANEL:PANORAMA TEST PLUS 5 ADDITIONAL MICRODELETIONS: FETAL FRACTION: 6.5

## 2022-04-24 ENCOUNTER — Ambulatory Visit (INDEPENDENT_AMBULATORY_CARE_PROVIDER_SITE_OTHER): Payer: Medicaid Other | Admitting: Obstetrics and Gynecology

## 2022-04-24 VITALS — BP 107/74 | HR 79 | Wt 139.0 lb

## 2022-04-24 DIAGNOSIS — Z3482 Encounter for supervision of other normal pregnancy, second trimester: Secondary | ICD-10-CM

## 2022-04-24 DIAGNOSIS — Z3480 Encounter for supervision of other normal pregnancy, unspecified trimester: Secondary | ICD-10-CM

## 2022-04-24 DIAGNOSIS — Z8759 Personal history of other complications of pregnancy, childbirth and the puerperium: Secondary | ICD-10-CM

## 2022-04-24 DIAGNOSIS — O219 Vomiting of pregnancy, unspecified: Secondary | ICD-10-CM

## 2022-04-24 DIAGNOSIS — Z3A14 14 weeks gestation of pregnancy: Secondary | ICD-10-CM

## 2022-04-24 DIAGNOSIS — G43E09 Chronic migraine with aura, not intractable, without status migrainosus: Secondary | ICD-10-CM

## 2022-04-24 MED ORDER — FAMOTIDINE 20 MG PO TABS: 20.0000 mg | ORAL_TABLET | Freq: Two times a day (BID) | ORAL | 3 refills | Status: DC | PRN

## 2022-04-24 NOTE — Progress Notes (Signed)
   PRENATAL VISIT NOTE  Subjective:  Tricia Allen is a 26 y.o. G2P1001 at [redacted]w[redacted]d being seen today for ongoing prenatal care.  She is currently monitored for the following issues for this low-risk pregnancy and has Prior pregnancy with Fetal renal anomaly; Supervision of other normal pregnancy, antepartum; History of pre-eclampsia; Rubella non-immune status, antepartum; and Chronic migraine with aura without status migrainosus, not intractable on their problem list.  Patient reports nausea and vivid dreams .  Contractions: Not present. Vag. Bleeding: None.  Movement: Absent. Denies leaking of fluid.   Reports feeling "strange" and "gross" at the end of the day. Chronic nausea, will try to eat something but it does not help. Pharmacy did not have diclegis so hasn't been taking it. Reports taking excedrin several times due to migraines. Has attempted to use APAP for migraines without much relief. Having vivid dreams/nightmares that make sleep difficult.   The following portions of the patient's history were reviewed and updated as appropriate: allergies, current medications, past family history, past medical history, past social history, past surgical history and problem list.   Objective:   Vitals:   04/24/22 1113  BP: 107/74  Pulse: 79  Weight: 139 lb (63 kg)    Fetal Status: Fetal Heart Rate (bpm): 160   Movement: Absent     General:  Alert, oriented and cooperative. Patient is in no acute distress.  Skin: Skin is warm and dry. No rash noted.   Cardiovascular: Normal heart rate noted  Respiratory: Normal respiratory effort, no problems with respiration noted  Abdomen: Soft, gravid, appropriate for gestational age.  Pain/Pressure: Present     Pelvic: Cervical exam deferred        Extremities: Normal range of motion.  Edema: None  Mental Status: Normal mood and affect. Normal behavior. Normal judgment and thought content.   Assessment and Plan:  Pregnancy: G2P1001 at [redacted]w[redacted]d 1.  Supervision of other normal pregnancy, antepartum Normal first trimester testing Nausea may be related to GERD - will trial famotidine Etiology may be related to anxiety, hormonal changes of pregnancy, etc  2. History of pre-eclampsia Continue to monitor BP and ASA  3. [redacted] weeks gestation of pregnancy Anatomy scan scheduled for decebmer   4. Chronic migraine with aura without status migrainosus, not intractable Switch excedrin to APAP Can consider caffeine, fioricet, magnesium, flexeril, or compazine Can also consider hydroxyzine to help with sleep    Preterm labor symptoms and general obstetric precautions including but not limited to vaginal bleeding, contractions, leaking of fluid and fetal movement were reviewed in detail with the patient. Please refer to After Visit Summary for other counseling recommendations.   No follow-ups on file.  Future Appointments  Date Time Provider Department Center  05/22/2022 11:15 AM Lorriane Shire, MD [facility] None  05/24/2022  8:15 AM [facility] NURSE [facility] [place]  05/24/2022  8:30 AM [facility] US3 [facility]  06/20/2022  8:55 AM Mayford Knife Elby Showers, CNM [facility] None    Lorriane Shire, MD

## 2022-04-25 ENCOUNTER — Encounter: Payer: Self-pay | Admitting: Obstetrics and Gynecology

## 2022-04-25 ENCOUNTER — Other Ambulatory Visit: Payer: Self-pay | Admitting: Obstetrics and Gynecology

## 2022-04-25 DIAGNOSIS — O09299 Supervision of pregnancy with other poor reproductive or obstetric history, unspecified trimester: Secondary | ICD-10-CM

## 2022-04-26 DIAGNOSIS — E538 Deficiency of other specified B group vitamins: Secondary | ICD-10-CM | POA: Diagnosis not present

## 2022-04-29 DIAGNOSIS — R070 Pain in throat: Secondary | ICD-10-CM | POA: Diagnosis not present

## 2022-05-01 ENCOUNTER — Encounter: Payer: Self-pay | Admitting: Obstetrics and Gynecology

## 2022-05-02 DIAGNOSIS — O26892 Other specified pregnancy related conditions, second trimester: Secondary | ICD-10-CM | POA: Diagnosis not present

## 2022-05-02 DIAGNOSIS — N3000 Acute cystitis without hematuria: Secondary | ICD-10-CM | POA: Diagnosis not present

## 2022-05-02 DIAGNOSIS — Z3A16 16 weeks gestation of pregnancy: Secondary | ICD-10-CM | POA: Diagnosis not present

## 2022-05-02 DIAGNOSIS — J111 Influenza due to unidentified influenza virus with other respiratory manifestations: Secondary | ICD-10-CM | POA: Diagnosis not present

## 2022-05-02 DIAGNOSIS — O2312 Infections of bladder in pregnancy, second trimester: Secondary | ICD-10-CM | POA: Diagnosis not present

## 2022-05-05 DIAGNOSIS — Z419 Encounter for procedure for purposes other than remedying health state, unspecified: Secondary | ICD-10-CM | POA: Diagnosis not present

## 2022-05-22 ENCOUNTER — Ambulatory Visit (INDEPENDENT_AMBULATORY_CARE_PROVIDER_SITE_OTHER): Payer: BC Managed Care – PPO | Admitting: Obstetrics and Gynecology

## 2022-05-22 ENCOUNTER — Other Ambulatory Visit (HOSPITAL_COMMUNITY)
Admission: RE | Admit: 2022-05-22 | Discharge: 2022-05-22 | Disposition: A | Discharge status: Home / Self Care | Payer: BC Managed Care – PPO | Source: Ambulatory Visit | Attending: Obstetrics and Gynecology | Admitting: Obstetrics and Gynecology

## 2022-05-22 VITALS — BP 99/65 | HR 70 | Wt 143.0 lb

## 2022-05-22 DIAGNOSIS — Z3482 Encounter for supervision of other normal pregnancy, second trimester: Secondary | ICD-10-CM

## 2022-05-22 DIAGNOSIS — Z8759 Personal history of other complications of pregnancy, childbirth and the puerperium: Secondary | ICD-10-CM

## 2022-05-22 DIAGNOSIS — N898 Other specified noninflammatory disorders of vagina: Secondary | ICD-10-CM | POA: Insufficient documentation

## 2022-05-22 DIAGNOSIS — G43E09 Chronic migraine with aura, not intractable, without status migrainosus: Secondary | ICD-10-CM

## 2022-05-22 DIAGNOSIS — Z3480 Encounter for supervision of other normal pregnancy, unspecified trimester: Secondary | ICD-10-CM

## 2022-05-22 DIAGNOSIS — Z3A18 18 weeks gestation of pregnancy: Secondary | ICD-10-CM

## 2022-05-22 NOTE — Progress Notes (Addendum)
   PRENATAL VISIT NOTE  Subjective:  Tricia Allen is a 26 y.o. G2P1001 at [redacted]w[redacted]d being seen today for ongoing prenatal care.  She is currently monitored for the following issues for this high-risk pregnancy and has Prior pregnancy with Fetal renal anomaly; Supervision of other normal pregnancy, antepartum; History of pre-eclampsia; Rubella non-immune status, antepartum; and Chronic migraine with aura without status migrainosus, not intractable on their problem list.  Patient reports no complaints.  Contractions: Not present. Vag. Bleeding: None.  Movement: Present. Denies leaking of fluid.   Nightmares stopped following the last appointment. Migraines have improved Having some vaginal discharge and wants to be checked for BV/yeast  The following portions of the patient's history were reviewed and updated as appropriate: allergies, current medications, past family history, past medical history, past social history, past surgical history and problem list.   Objective:   Vitals:   05/22/22 1109  BP: 99/65  Pulse: 70  Weight: 143 lb (64.9 kg)    Fetal Status: Fetal Heart Rate (bpm): 163   Movement: Present     General:  Alert, oriented and cooperative. Patient is in no acute distress.  Skin: Skin is warm and dry. No rash noted.   Cardiovascular: Normal heart rate noted  Respiratory: Normal respiratory effort, no problems with respiration noted  Abdomen: Soft, gravid, appropriate for gestational age.  Pain/Pressure: Absent     Pelvic: Cervical exam deferred        Extremities: Normal range of motion.  Edema: None  Mental Status: Normal mood and affect. Normal behavior. Normal judgment and thought content.   Assessment and Plan:  Pregnancy: G2P1001 at [redacted]w[redacted]d 1. [redacted] weeks gestation of pregnancy Routine screening - AFP, Serum, Open Spina Bifida  2. Supervision of other normal pregnancy, antepartum FH & FHR wnl  - AFP, Serum, Open Spina Bifida  3. History of pre-eclampsia Continue  daily ASA  4. Chronic migraine with aura without status migrainosus, not intractable Improved at this time  5. Vaginal discharge Swab today  - Cervicovaginal ancillary only( [place])  Preterm labor symptoms and general obstetric precautions including but not limited to vaginal bleeding, contractions, leaking of fluid and fetal movement were reviewed in detail with the patient. Please refer to After Visit Summary for other counseling recommendations.   No follow-ups on file.  Future Appointments  Date Time Provider Department Center  05/24/2022  8:15 AM [facility] NURSE [facility]  05/24/2022  8:30 AM [facility] US3 [facility]  05/24/2022 10:30 AM [facility] GENETIC COUNSELING [facility]  06/20/2022  8:55 AM Aviva Signs, CNM [facility] None  07/18/2022  8:35 AM Aviva Signs, CNM [facility] None    Lorriane Shire, MD

## 2022-05-23 ENCOUNTER — Other Ambulatory Visit: Payer: Self-pay | Admitting: Obstetrics and Gynecology

## 2022-05-23 DIAGNOSIS — B3731 Acute candidiasis of vulva and vagina: Secondary | ICD-10-CM

## 2022-05-23 LAB — CERVICOVAGINAL ANCILLARY ONLY
Bacterial Vaginitis (gardnerella): NEGATIVE
Candida Glabrata: NEGATIVE
Candida Vaginitis: POSITIVE — AB
Comment: NEGATIVE
Comment: NEGATIVE
Comment: NEGATIVE

## 2022-05-23 MED ORDER — CLOTRIMAZOLE 1 % VA CREA: 1.0000 | TOPICAL_CREAM | Freq: Every day | VAGINAL | 2 refills | Status: AC

## 2022-05-24 ENCOUNTER — Ambulatory Visit: Payer: BC Managed Care – PPO | Admitting: *Deleted

## 2022-05-24 ENCOUNTER — Ambulatory Visit: Payer: BC Managed Care – PPO | Attending: Obstetrics and Gynecology | Admitting: Maternal & Fetal Medicine

## 2022-05-24 ENCOUNTER — Ambulatory Visit (HOSPITAL_BASED_OUTPATIENT_CLINIC_OR_DEPARTMENT_OTHER): Payer: BC Managed Care – PPO

## 2022-05-24 ENCOUNTER — Encounter: Payer: Self-pay | Admitting: *Deleted

## 2022-05-24 ENCOUNTER — Other Ambulatory Visit: Payer: Medicaid Other

## 2022-05-24 ENCOUNTER — Other Ambulatory Visit: Payer: Self-pay | Admitting: *Deleted

## 2022-05-24 ENCOUNTER — Ambulatory Visit: Payer: BC Managed Care – PPO | Attending: Obstetrics and Gynecology

## 2022-05-24 VITALS — BP 107/61 | HR 83

## 2022-05-24 DIAGNOSIS — O09292 Supervision of pregnancy with other poor reproductive or obstetric history, second trimester: Secondary | ICD-10-CM | POA: Insufficient documentation

## 2022-05-24 DIAGNOSIS — Z3A19 19 weeks gestation of pregnancy: Secondary | ICD-10-CM | POA: Diagnosis not present

## 2022-05-24 DIAGNOSIS — Z363 Encounter for antenatal screening for malformations: Secondary | ICD-10-CM | POA: Insufficient documentation

## 2022-05-24 DIAGNOSIS — Z8489 Family history of other specified conditions: Secondary | ICD-10-CM

## 2022-05-24 DIAGNOSIS — O352XX0 Maternal care for (suspected) hereditary disease in fetus, not applicable or unspecified: Secondary | ICD-10-CM | POA: Diagnosis not present

## 2022-05-24 DIAGNOSIS — Z3A10 10 weeks gestation of pregnancy: Secondary | ICD-10-CM

## 2022-05-24 DIAGNOSIS — Z8759 Personal history of other complications of pregnancy, childbirth and the puerperium: Secondary | ICD-10-CM | POA: Diagnosis not present

## 2022-05-24 DIAGNOSIS — O1492 Unspecified pre-eclampsia, second trimester: Secondary | ICD-10-CM | POA: Diagnosis not present

## 2022-05-24 DIAGNOSIS — O99352 Diseases of the nervous system complicating pregnancy, second trimester: Secondary | ICD-10-CM | POA: Insufficient documentation

## 2022-05-24 DIAGNOSIS — O4442 Low lying placenta NOS or without hemorrhage, second trimester: Secondary | ICD-10-CM

## 2022-05-24 DIAGNOSIS — Z3689 Encounter for other specified antenatal screening: Secondary | ICD-10-CM | POA: Insufficient documentation

## 2022-05-24 DIAGNOSIS — O358XX0 Maternal care for other (suspected) fetal abnormality and damage, not applicable or unspecified: Secondary | ICD-10-CM | POA: Diagnosis not present

## 2022-05-24 DIAGNOSIS — Z3481 Encounter for supervision of other normal pregnancy, first trimester: Secondary | ICD-10-CM | POA: Diagnosis not present

## 2022-05-24 DIAGNOSIS — O09299 Supervision of pregnancy with other poor reproductive or obstetric history, unspecified trimester: Secondary | ICD-10-CM

## 2022-05-24 DIAGNOSIS — O3503X0 Maternal care for (suspected) central nervous system malformation or damage in fetus, choroid plexus cysts, not applicable or unspecified: Secondary | ICD-10-CM

## 2022-05-24 LAB — AFP, SERUM, OPEN SPINA BIFIDA
AFP MoM: 0.89
AFP Value: 42.9 ng/mL
Gest. Age on Collection Date: 18.5 weeks
Maternal Age At EDD: 26.5 yr
OSBR Risk 1 IN: 10000
Test Results:: NEGATIVE
Weight: 143 [lb_av]

## 2022-05-24 NOTE — Progress Notes (Addendum)
MFM Consult Note Patient Name: Tricia Allen  Patient MRN:   [id number]  Referring provider: HP Reason for Consult: Prior fetus with genetic disorder   HPI: Tricia Allen is a 26 y.o. G2P1001 at [redacted]w[redacted]d here for ultrasound and consultation.   RE low-lying placenta: The placenta is 1.8 cm from the internal os. This is likely to resolve prior to delivery but we will reassess throughout her pregnancy.   RE CPC: Choroid plexus cysts were seen on today's ultrasound. The patient had low risk cell free DNA meaning this finding can be considered a normal variant.    RE hx of genetic disease: The patient has a child (son) with Smith-Magenis syndrome. He has intellectual/learning disability and some sleep disturbances. He was also born with only one kidney. This is a different father of this pregnancy and the father of a her affected son did not undergo genetic testing.   RE hx of preeclampsia: Tricia Allen has a history of preeclampsia at term in a prior pregnancy. She is taking Aspirin.    Review of Systems: A review of systems was performed and was negative except per HPI   Vitals and Physical Exam See intake sheet for vitals Sitting comfortably on the sonogram table Nonlabored breathing Normal rate and rhythm Abdomen is nontender  Genetic testing: low risk NIPS  Sonographic findings Single intrauterine pregnancy. Observed fetal cardiac activity. Variable presentation. Fetal anatomy that was well seen appears normal without evidence of soft markers. Not all fetal structures were well seen due to a technically diffcult exam and the anatomic survey remains incomplete with limited views of the some of the fetal anatomy Fetal biometry shows the estimated fetal weight at the 47 percentile.  Amniotic fluid volume: Within normal limits. Placenta: Posterior, low-lying, 1.8cm from int os. Cervix: Normal appearance by transabdominal scan with a cervical length of 3.2 cm. Adnexa: No masses visualized.    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 Her affected son does have a pathologic mutation. See GC note for separate details.   I spent 60 minutes reviewing the patients chart, including labs and images as well as counseling the patient about her medical conditions.  [name]  MFM, [facility]   05/24/2022  11:45 AM

## 2022-05-24 NOTE — Progress Notes (Signed)
[facility] at [facility] [address] Phone:  [phone]   Fax:  [phone]    Name: Tricia Allen: Previous child with Smith-Magenis Syndrome  DOB: Jan 17, 1996 Age: 26 y.o.   EDD: 10/18/2022 LMP: 01/11/2022 Referring Provider:  Lorriane Shire, MD  EGA: [redacted]w[redacted]d Genetic Counselor: Teena Dunk, MS, CGC  OB Hx: [id number] Date of Appointment: 05/24/2022  Accompanied by: The father of the current pregnancy, Karolee Ohs Face to Face Time: 30 Minutes   Previous Testing Completed: Ryli previously completed cell-free DNA screening (cfDNA) in this pregnancy. The result is low risk. This screening significantly reduces the risk that the current pregnancy has Down syndrome, Trisomy 69, Trisomy 57, and common sex chromosome conditions, however, the risk is not zero given the limitations of cfDNA. Additionally, there are many genetic conditions that cannot be detected by cfDNA.  Ivan previously completed carrier screening. She screened to not be a carrier for Cystic Fibrosis (CF), Spinal Muscular Atrophy (SMA), alpha thalassemia, and beta hemoglobinopathies. A negative result on carrier screening reduces the likelihood of being a carrier, however, does not entirely rule out the possibility. Ameia previously completed a maternal serum AFP screen in this pregnancy. The result is screen negative. A negative result reduces the risk that the current pregnancy has an open neural tube defect. Closed neural tube defects and some open defects may not be detected by this screen.    Medical & Family History:  Reports she takes prenatal vitamins.  Denies personal history of diabetes, high blood pressure, thyroid conditions, and seizures. Denies using tobacco, alcohol, or street drugs in this pregnancy. Maternal ethnicity reported as Caucasian and paternal ethnicity reported as Caucasian. Denies consanguinity. Tricia Allen from a  previous partner has a diagnosis of Smith-Magenis Syndrome (see genetic counseling portion of the note below)     Genetic Counseling:   Previous Child with Smith-Magenis Syndrome. Tricia Allen, Tricia Allen, Tricia Allen) has a diagnosis of Smith-Magenis Syndrome. With permission from [place], genetic counseling reviewed documentation in Tricia Allen's medical record. Tricia Allen is followed by [facility] and has been seen by medical geneticist Tricia Allen and genetic counselor Tricia Allen. 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 Based on the literature, pathogenic variants in the TBCK gene have been associated with infantile hypotonia with psychomotor delays and characteristic facial features. Of note, these features have been seen in a recessive manner, meaning there has been a change on both copies of the gene to cause these features. We know from this testing that Tricia Allen only has a deletion on one copy. Maternal FISH testing for both of these chromosomal deletions was negative. Paternal FISH testing was not performed. Other genetic testing performed for Tricia Allen includes testing of the CFTR gene for 65 most common gene changes. Tricia Allen's CFTR gene testing was negative.  To quote an excellent GeneReview updated last on August 12, 2020, "Smith-Magenis Syndrome (SMS) is a condition characterized by distinctive physical features (particularly coarse facial features that progress with age), developmental delay, cognitive impairment, behavioral abnormalities, sleep disturbance, and childhood-onset abdominal obesity. Infants have feeding  difficulties, failure to thrive, hypotonia, hyporeflexia, prolonged napping or need to be awakened for  feeds, and generalized lethargy. The majority of individuals function in the mild-to-moderate range of intellectual disability. The behavioral phenotype, including significant sleep disturbance, stereotypies, and maladaptive and self-injurious behaviors, is generally not recognized until age 24 months or older and continues to change until adulthood. Sensory issues are frequently noted; these may include avoidant behavior, as well as repetitive seeking of textures, sounds, and experiences. Toileting difficulties are common. Significant anxiety is common as are problems with executive functioning, including inattention, distractibility, hyperactivity, and impulsivity. Maladaptive behaviors include frequent outbursts / temper tantrums, attention-seeking behaviors, opposition, aggression, and self-injurious behaviors including self-hitting, self-biting, skin picking, inserting foreign objects into body orifices (polyembolokoilamania), and yanking fingernails and/or toenails (onychotillomania). Among the stereotypic behaviors described, the spasmodic upper-body squeeze or 'self-hug' seems to be highly associated with SMS. An underlying developmental asynchrony, specifically emotional maturity delayed beyond intellectual functioning, may also contribute to maladaptive behaviors in people with SMS. . SMS is caused by a heterozygous deletion of or a heterozygous pathogenic variant in RAI1 on chromosome 17p11.2. The majority of 17p11.2 deletions are de novo [new in an affected individual's body for the first time], while deleterious variants in RAI1 can be de novo or inherited. 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 We offered Tasheika the option of amniocentesis for prenatal diagnosis. This procedure involves the removal of a small amount of amniotic fluid from the sac surrounding the pregnancy with the use of a thin needle inserted through the maternal abdomen and uterus. Ultrasound guidance is used throughout the procedure. Possible procedural difficulties and complications that can arise include maternal infection, cramping, bleeding, fluid leakage, and/or pregnancy loss. The risk for pregnancy loss with an amniocentesis is 1/500. Makisha declined amniocentesis at this time. We discussed that an evaluation with pediatric genetics after birth could be considered given the family history.        Patient Plan:  Proceed with: Routine prenatal care Informed consent was obtained. All questions were answered.  Declined: Amniocentesis   Thank you for sharing in the care of Karmela with [place].  Please do not hesitate to contact us if you have any questions.  Teena Dunk, MS, [facility]

## 2022-05-31 DIAGNOSIS — E538 Deficiency of other specified B group vitamins: Secondary | ICD-10-CM | POA: Diagnosis not present

## 2022-06-05 ENCOUNTER — Inpatient Hospital Stay (HOSPITAL_COMMUNITY)
Admission: AD | Admit: 2022-06-05 | Discharge: 2022-06-05 | Disposition: A | Discharge status: Home / Self Care | Payer: Medicaid Other | Attending: Obstetrics and Gynecology | Admitting: Obstetrics and Gynecology

## 2022-06-05 ENCOUNTER — Other Ambulatory Visit: Payer: Self-pay

## 2022-06-05 ENCOUNTER — Encounter (HOSPITAL_COMMUNITY): Payer: Self-pay | Admitting: Obstetrics and Gynecology

## 2022-06-05 DIAGNOSIS — O26892 Other specified pregnancy related conditions, second trimester: Secondary | ICD-10-CM | POA: Insufficient documentation

## 2022-06-05 DIAGNOSIS — R42 Dizziness and giddiness: Secondary | ICD-10-CM | POA: Diagnosis not present

## 2022-06-05 DIAGNOSIS — Z8759 Personal history of other complications of pregnancy, childbirth and the puerperium: Secondary | ICD-10-CM

## 2022-06-05 DIAGNOSIS — R000 Tachycardia, unspecified: Secondary | ICD-10-CM

## 2022-06-05 DIAGNOSIS — O09899 Supervision of other high risk pregnancies, unspecified trimester: Secondary | ICD-10-CM

## 2022-06-05 DIAGNOSIS — Z3A20 20 weeks gestation of pregnancy: Secondary | ICD-10-CM | POA: Diagnosis not present

## 2022-06-05 DIAGNOSIS — R55 Syncope and collapse: Secondary | ICD-10-CM | POA: Diagnosis not present

## 2022-06-05 DIAGNOSIS — Z419 Encounter for procedure for purposes other than remedying health state, unspecified: Secondary | ICD-10-CM | POA: Diagnosis not present

## 2022-06-05 DIAGNOSIS — Z3480 Encounter for supervision of other normal pregnancy, unspecified trimester: Secondary | ICD-10-CM

## 2022-06-05 HISTORY — DX: Unspecified ovarian cyst, left side: N83.202

## 2022-06-05 LAB — COMPREHENSIVE METABOLIC PANEL
ALT: 12 U/L (ref 0–44)
AST: 17 U/L (ref 15–41)
Albumin: 3.3 g/dL — ABNORMAL LOW (ref 3.5–5.0)
Alkaline Phosphatase: 40 U/L (ref 38–126)
Anion gap: 5 (ref 5–15)
BUN: <5 mg/dL — ABNORMAL LOW (ref 6–20)
CO2: 23 mmol/L (ref 22–32)
Calcium: 8.8 mg/dL — ABNORMAL LOW (ref 8.9–10.3)
Chloride: 104 mmol/L (ref 98–111)
Creatinine, Ser: 0.54 mg/dL (ref 0.44–1.00)
GFR, Estimated: >60 mL/min (ref >60)
Glucose, Bld: 94 mg/dL (ref 70–99)
Potassium: 3.9 mmol/L (ref 3.5–5.1)
Sodium: 132 mmol/L — ABNORMAL LOW (ref 135–145)
Total Bilirubin: 0.3 mg/dL (ref 0.3–1.2)
Total Protein: 6.2 g/dL — ABNORMAL LOW (ref 6.5–8.1)

## 2022-06-05 LAB — URINALYSIS, ROUTINE W REFLEX MICROSCOPIC
Bilirubin Urine: NEGATIVE
Glucose, UA: NEGATIVE mg/dL
Hgb urine dipstick: NEGATIVE
Ketones, ur: NEGATIVE mg/dL
Leukocytes,Ua: NEGATIVE
Nitrite: NEGATIVE
Protein, ur: NEGATIVE mg/dL
Specific Gravity, Urine: 1.01 (ref 1.005–1.030)
pH: 5 (ref 5.0–8.0)

## 2022-06-05 LAB — CBC
HCT: 32.6 % — ABNORMAL LOW (ref 36.0–46.0)
Hemoglobin: 11.3 g/dL — ABNORMAL LOW (ref 12.0–15.0)
MCH: 33.4 pg (ref 26.0–34.0)
MCHC: 34.7 g/dL (ref 30.0–36.0)
MCV: 96.4 fL (ref 80.0–100.0)
Platelets: 202 10*3/uL (ref 150–400)
RBC: 3.38 MIL/uL — ABNORMAL LOW (ref 3.87–5.11)
RDW: 12.6 % (ref 11.5–15.5)
WBC: 10.2 10*3/uL (ref 4.0–10.5)
nRBC: 0 % (ref 0.0–0.2)

## 2022-06-05 NOTE — MAU Provider Note (Signed)
Chief Complaint: Dizziness   Event Date/Time   First Provider Initiated Contact with Patient 06/05/22 1534     SUBJECTIVE HPI: Tricia Allen is a 27 y.o. G2P1001 at [redacted]w[redacted]d who presents to Maternity Admissions reporting episode of dizziness and near-syncope in the shower and continued dizziness, fast heart rate and bilat blurry vision. No Hx of similar Sx. No Hx cardiac or neuro issues. Ate and drank normally today. Denies N/V.   Modifying factors: Hasn't tried anything. Associated signs and symptoms: Neg for fever, chills, vaginal bleeding, HA, difficulties with speech or gait, SOB, chest pain.   Past Medical History:  Diagnosis Date   Anxiety    OB History  Gravida Para Term Preterm AB Living  2 1 1     1   SAB IAB Ectopic Multiple Live Births          1    # Outcome Date GA Lbr Len/2nd Weight Sex Delivery Anes PTL Lv  2 Current           1 Term 05/08/17 [redacted]w[redacted]d    Vag-Spont   LIV   Past Surgical History:  Procedure Laterality Date   TONSILLECTOMY     TONSILLECTOMY AND ADENOIDECTOMY     Social History   Socioeconomic History   Marital status: Single    Spouse name: Not on file   Number of children: Not on file   Years of education: Not on file   Highest education level: Not on file  Occupational History   Not on file  Tobacco Use   Smoking status: Never   Smokeless tobacco: Never  Vaping Use   Vaping Use: Never used  Substance and Sexual Activity   Alcohol use: No   Drug use: No   Sexual activity: Yes    Birth control/protection: None  Other Topics Concern   Not on file  Social History Narrative   Not on file   Social Determinants of Health   Financial Resource Strain: Not on file  Food Insecurity: Not on file  Transportation Needs: Not on file  Physical Activity: Not on file  Stress: Not on file  Social Connections: Not on file  Intimate Partner Violence: Not on file   Family History  Problem Relation Age of Onset   Hypertension Father    No current  facility-administered medications on file prior to encounter.   Current Outpatient Medications on File Prior to Encounter  Medication Sig Dispense Refill   aspirin EC 81 MG tablet Take 1 tablet (81 mg total) by mouth daily. Take after 12 weeks for prevention of preeclampsia later in pregnancy 300 tablet 2   famotidine (PEPCID) 20 MG tablet Take 1 tablet (20 mg total) by mouth 2 (two) times daily as needed for heartburn or indigestion. 60 tablet 3   Prenatal Vit w/Fe-Methylfol-FA (PNV PO) Take by mouth.     Doxylamine-Pyridoxine (DICLEGIS) 10-10 MG TBEC Take 2 tablets by mouth at bedtime. If symptoms persist, add one tablet in the morning and one in the afternoon (Patient not taking: Reported on 04/24/2022) 100 tablet 5   Allergies  Allergen Reactions   Codeine     I have reviewed patient's Past Medical Hx, Surgical Hx, Family Hx, Social Hx, medications and allergies.   Review of Systems  Constitutional:  Negative for fever.  Eyes:  Positive for visual disturbance (blurry vision).  Respiratory:  Negative for cough, chest tightness and shortness of breath.   Cardiovascular:  Negative for chest pain and palpitations.  Gastrointestinal:  Negative for abdominal pain, nausea and vomiting.  Genitourinary:  Negative for vaginal bleeding.  Neurological:  Positive for dizziness and syncope (Near-syncope). Negative for seizures, speech difficulty, weakness, numbness and headaches.    OBJECTIVE Patient Vitals for the past 24 hrs:  BP Temp Temp src Pulse Resp SpO2 Height Weight  06/05/22 1803 -- -- Oral 78 17 100 % -- --  06/05/22 1518 106/64 -- Oral 99 17 99 % -- --  06/05/22 1456 121/72 98 F (36.7 C) Oral (!) 102 16 98 % -- --  06/05/22 1451 -- -- -- -- -- -- 5\' 2"  (1.575 m) 67.1 kg   Orthostatic Vital Signs Lying   BP 98/58 P 80 Sitting   BP 106/66 P 85 Standing at 0 Minutes BP 105/69 P 94 Standing at 3 Minutes BP 106/70 P 101  Constitutional: Well-developed, well-nourished female in  no acute distress.  Skin: No pallor or cyanosis Cardiovascular: normal rate Respiratory: normal rate and effort.  GI: Abd soft, non-tender, gravid appropriate for gestational age.  MS: Extremities nontender, no edema, normal ROM Neurologic: Alert and oriented x 4. Nml speech and gait. Grossly nml mvmnt and strength bilat.  GU: Deferred  FHR 145 by doppler  LAB RESULTS Results for orders placed or performed during the hospital encounter of 06/05/22 (from the past 24 hour(s))  CBC     Status: Abnormal   Collection Time: 06/05/22  3:14 PM  Result Value Ref Range   WBC 10.2 4.0 - 10.5 K/uL   RBC 3.38 (L) 3.87 - 5.11 MIL/uL   Hemoglobin 11.3 (L) 12.0 - 15.0 g/dL   HCT 32.6 (L) 36.0 - 46.0 %   MCV 96.4 80.0 - 100.0 fL   MCH 33.4 26.0 - 34.0 pg   MCHC 34.7 30.0 - 36.0 g/dL   RDW 12.6 11.5 - 15.5 %   Platelets 202 150 - 400 K/uL   nRBC 0.0 0.0 - 0.2 %  Comprehensive metabolic panel     Status: Abnormal   Collection Time: 06/05/22  3:14 PM  Result Value Ref Range   Sodium 132 (L) 135 - 145 mmol/L   Potassium 3.9 3.5 - 5.1 mmol/L   Chloride 104 98 - 111 mmol/L   CO2 23 22 - 32 mmol/L   Glucose, Bld 94 70 - 99 mg/dL   BUN <5 (L) 6 - 20 mg/dL   Creatinine, Ser 0.54 0.44 - 1.00 mg/dL   Calcium 8.8 (L) 8.9 - 10.3 mg/dL   Total Protein 6.2 (L) 6.5 - 8.1 g/dL   Albumin 3.3 (L) 3.5 - 5.0 g/dL   AST 17 15 - 41 U/L   ALT 12 0 - 44 U/L   Alkaline Phosphatase 40 38 - 126 U/L   Total Bilirubin 0.3 0.3 - 1.2 mg/dL   GFR, Estimated >60 >60 mL/min   Anion gap 5 5 - 15  Urinalysis, Routine w reflex microscopic Urine, Clean Catch     Status: Abnormal   Collection Time: 06/05/22  3:46 PM  Result Value Ref Range   Color, Urine STRAW (A) YELLOW   APPearance CLEAR CLEAR   Specific Gravity, Urine 1.010 1.005 - 1.030   pH 5.0 5.0 - 8.0   Glucose, UA NEGATIVE NEGATIVE mg/dL   Hgb urine dipstick NEGATIVE NEGATIVE   Bilirubin Urine NEGATIVE NEGATIVE   Ketones, ur NEGATIVE NEGATIVE mg/dL    Protein, ur NEGATIVE NEGATIVE mg/dL   Nitrite NEGATIVE NEGATIVE   Leukocytes,Ua NEGATIVE NEGATIVE    IMAGING NA  ECG NSR  MAU COURSE Orders Placed This Encounter  Procedures   Urinalysis, Routine w reflex microscopic Urine, Clean Catch   CBC   Comprehensive metabolic panel   Orthostatic vital signs   Encourage fluids   EKG 12-Lead   Discharge patient   No orders of the defined types were placed in this encounter.    MDM - No clear etiology for near-syncope, dizziness, subjective tachycardia and blurry vision. Borderline orthostatic tachycardia. Pt PO hydrated with electrolyte drink. Ambulating without dizziness. Suspect Sx could have been related to vasodilation in shower. Recommend Op Cardiology eval. Will send message to Dr. Servando Salina. Encouraged pt to stay well-hydrated, have small, frequent snacks, avoid hot showers and be cautions with position changes.   ASSESSMENT 1. Near syncope   2. Tachycardia   3. [redacted] weeks gestation of pregnancy     PLAN Discharge home in stable condition. Syncope precautions  Follow-up Information     [facility] Follow up.   Specialty: Obstetrics and Gynecology Why: As scheduled or sooner as needed if symptoms worsen Contact information: [address] [place] [address] [phone]        [facility] Follow up.   Specialty: Obstetrics and Gynecology Why: As needed in emergencies Contact information: [address] [id number] [place] [address] [phone]               Allergies as of 06/05/2022       Reactions   Codeine         Medication List     TAKE these medications    aspirin EC 81 MG tablet Take 1 tablet (81 mg total) by mouth daily. Take after 12 weeks for prevention of preeclampsia later in pregnancy   Doxylamine-Pyridoxine 10-10 MG Tbec Commonly known as: Diclegis Take 2 tablets  by mouth at bedtime. If symptoms persist, add one tablet in the morning and one in the afternoon   famotidine 20 MG tablet Commonly known as: PEPCID Take 1 tablet (20 mg total) by mouth 2 (two) times daily as needed for heartburn or indigestion.   PNV PO Take by mouth.         Katrinka Blazing, [place], [place] 06/05/2022  6:08 PM

## 2022-06-05 NOTE — L&D Delivery Note (Signed)
OB/GYN Faculty Practice Delivery Note  Tricia Allen is a 27 y.o. G2P1001 s/p VD at [redacted]w[redacted]d. She was admitted for IOL 2/2 gHTN.   ROM: 9h 68m with clear fluid GBS Status:  Positive/-- (04/24 0850) Maximum Maternal Temperature: 98.9  Labor Progress: Initial SVE: 1.5/thick/-3. She then progressed to complete.   Delivery Date/Time: 10/17/2022 @0746  Delivery: Called to room and patient was complete and pushing. Head delivered MOA. No nuchal cord present. Shoulder and body delivered in usual fashion. Infant with spontaneous cry, placed on mother's abdomen, dried and stimulated. Cord clamped x 2 after 1-minute delay, and cut by FOB. Cord blood drawn. Placenta delivered spontaneously with gentle cord traction. Fundus firm with massage and Pitocin. Labia, perineum, vagina, and cervix inspected. There was a vaginal abrasion that required a figure of eight stitch for hemostasis.  Baby Weight: pending  Placenta: 3 vessel, intact. Sent to L&D Complications: None Lacerations: Vaginal abrasion, 4.0 monocryl EBL: 335 mL Analgesia: Epidural   Infant:  APGAR (1 MIN): 8   APGAR (5 MINS): 9    [name], DO OB Fellow, Faculty [facility], [facility] [employer] 10/17/2022, 9:04 AM

## 2022-06-05 NOTE — MAU Note (Signed)
Tricia Allen is a 27 y.o. at [redacted]w[redacted]d here in MAU reporting: earlier when she was in the shower she got lightheaded and fell into the wall. Was able to get out but since then has been feeling dizzy and feels like heart rate gets high. This started around 1300.  Onset of complaint: today  Pain score: 1/10  Vitals:   06/05/22 1456  BP: 121/72  Pulse: (!) 102  Resp: 16  Temp: 98 F (36.7 C)  SpO2: 98%     FHT:145  Lab orders placed from triage: UA

## 2022-06-08 ENCOUNTER — Other Ambulatory Visit: Payer: Self-pay | Admitting: Advanced Practice Midwife

## 2022-06-08 ENCOUNTER — Encounter (HOSPITAL_COMMUNITY): Payer: Self-pay | Admitting: Obstetrics and Gynecology

## 2022-06-08 DIAGNOSIS — Z3A20 20 weeks gestation of pregnancy: Secondary | ICD-10-CM

## 2022-06-08 DIAGNOSIS — R55 Syncope and collapse: Secondary | ICD-10-CM

## 2022-06-08 DIAGNOSIS — R000 Tachycardia, unspecified: Secondary | ICD-10-CM

## 2022-06-20 ENCOUNTER — Encounter: Payer: Self-pay | Admitting: Advanced Practice Midwife

## 2022-06-20 ENCOUNTER — Ambulatory Visit: Payer: Medicaid Other | Admitting: Advanced Practice Midwife

## 2022-06-20 VITALS — BP 105/73 | HR 91 | Wt 155.0 lb

## 2022-06-20 DIAGNOSIS — Z3480 Encounter for supervision of other normal pregnancy, unspecified trimester: Secondary | ICD-10-CM

## 2022-06-20 DIAGNOSIS — R102 Pelvic and perineal pain: Secondary | ICD-10-CM

## 2022-06-20 DIAGNOSIS — Z3A22 22 weeks gestation of pregnancy: Secondary | ICD-10-CM

## 2022-06-20 DIAGNOSIS — O4442 Low lying placenta NOS or without hemorrhage, second trimester: Secondary | ICD-10-CM

## 2022-06-20 DIAGNOSIS — Z2839 Other underimmunization status: Secondary | ICD-10-CM

## 2022-06-20 DIAGNOSIS — Z8759 Personal history of other complications of pregnancy, childbirth and the puerperium: Secondary | ICD-10-CM

## 2022-06-20 DIAGNOSIS — O4440 Low lying placenta NOS or without hemorrhage, unspecified trimester: Secondary | ICD-10-CM

## 2022-06-20 DIAGNOSIS — O09892 Supervision of other high risk pregnancies, second trimester: Secondary | ICD-10-CM

## 2022-06-20 NOTE — Progress Notes (Signed)
   PRENATAL VISIT NOTE  Subjective:  Tricia Allen is a 27 y.o. G2P1001 at [redacted]w[redacted]d being seen today for ongoing prenatal care.  She is currently monitored for the following issues for this high-risk pregnancy and has Prior pregnancy with Fetal renal anomaly; Supervision of other normal pregnancy, antepartum; History of pre-eclampsia; Rubella non-immune status, antepartum; Chronic migraine with aura without status migrainosus, not intractable; Near syncope; and Anxiety on their problem list.  Patient reports bleeding and cramping every now and then.  Bleeding happened once a week or two ago, never recurred.  Has some round ligament pains but also occasional cramping. .  Contractions: Not present. Vag. Bleeding: None.  Movement: Present. Denies leaking of fluid.   The following portions of the patient's history were reviewed and updated as appropriate: allergies, current medications, past family history, past medical history, past social history, past surgical history and problem list.   Objective:   Vitals:   06/20/22 0902  BP: 105/73  Pulse: 91  Weight: 155 lb (70.3 kg)    Fetal Status: Fetal Heart Rate (bpm): 145   Movement: Present   Fundal height 21cm  General:  Alert, oriented and cooperative. Patient is in no acute distress.  Skin: Skin is warm and dry. No rash noted.   Cardiovascular: Normal heart rate noted  Respiratory: Normal respiratory effort, no problems with respiration noted  Abdomen: Soft, gravid, appropriate for gestational age.  Pain/Pressure: Present     Pelvic: Cervical exam deferred        Extremities: Normal range of motion.  Edema: None  Mental Status: Normal mood and affect. Normal behavior. Normal judgment and thought content.   Assessment and Plan:  Pregnancy: G2P1001 at [redacted]w[redacted]d 1. Supervision of other normal pregnancy, antepartum     Discussed normality of round ligament pains     Discussed if cramping persists, needs to come in   2. History of  pre-eclampsia    normotensive  3. Rubella non-immune status, antepartum     Vaccine PP  4. Low-lying placenta     Is scheduled for repeat US      If any more bleeding needs to come in asap.  States was not counseled about low lying placenta.  I explained it in context of previa vs low lying.  Placenta is likely to move, but would recommend being seen if any bleeding  5. Pelvic cramping     Discussed above  Preterm labor symptoms and general obstetric precautions including but not limited to vaginal bleeding, contractions, leaking of fluid and fetal movement were reviewed in detail with the patient. Please refer to After Visit Summary for other counseling recommendations.   Return in about 4 weeks (around 07/18/2022) for [facility].  Future Appointments  Date Time Provider [place]  06/28/2022  8:30 AM [facility] NURSE [facility]  06/28/2022  8:45 AM [facility]  07/18/2022  8:35 AM Seabron Spates, CNM [facility] None  08/01/2022 11:15 AM Seabron Spates, CNM [facility] None  08/15/2022 11:15 AM Tresea Mall, CNM [facility] None  08/29/2022 11:15 AM Gavin Pound, CNM [facility] None    Hansel Feinstein, CNM

## 2022-06-21 ENCOUNTER — Encounter: Payer: Self-pay | Admitting: Advanced Practice Midwife

## 2022-06-23 ENCOUNTER — Encounter (HOSPITAL_COMMUNITY): Payer: Self-pay | Admitting: Obstetrics & Gynecology

## 2022-06-23 ENCOUNTER — Inpatient Hospital Stay (HOSPITAL_COMMUNITY)
Admission: AD | Admit: 2022-06-23 | Discharge: 2022-06-23 | Disposition: A | Discharge status: Home / Self Care | Payer: Medicaid Other | Attending: Obstetrics & Gynecology | Admitting: Obstetrics & Gynecology

## 2022-06-23 DIAGNOSIS — O09292 Supervision of pregnancy with other poor reproductive or obstetric history, second trimester: Secondary | ICD-10-CM | POA: Insufficient documentation

## 2022-06-23 DIAGNOSIS — Z3490 Encounter for supervision of normal pregnancy, unspecified, unspecified trimester: Secondary | ICD-10-CM

## 2022-06-23 DIAGNOSIS — O26892 Other specified pregnancy related conditions, second trimester: Secondary | ICD-10-CM | POA: Insufficient documentation

## 2022-06-23 DIAGNOSIS — R109 Unspecified abdominal pain: Secondary | ICD-10-CM | POA: Diagnosis not present

## 2022-06-23 DIAGNOSIS — Z8759 Personal history of other complications of pregnancy, childbirth and the puerperium: Secondary | ICD-10-CM

## 2022-06-23 DIAGNOSIS — Z3480 Encounter for supervision of other normal pregnancy, unspecified trimester: Secondary | ICD-10-CM

## 2022-06-23 DIAGNOSIS — O26899 Other specified pregnancy related conditions, unspecified trimester: Secondary | ICD-10-CM | POA: Diagnosis not present

## 2022-06-23 DIAGNOSIS — Z3A23 23 weeks gestation of pregnancy: Secondary | ICD-10-CM | POA: Diagnosis not present

## 2022-06-23 DIAGNOSIS — O09899 Supervision of other high risk pregnancies, unspecified trimester: Secondary | ICD-10-CM

## 2022-06-23 LAB — URINALYSIS, ROUTINE W REFLEX MICROSCOPIC
Bilirubin Urine: NEGATIVE
Glucose, UA: NEGATIVE mg/dL
Hgb urine dipstick: NEGATIVE
Ketones, ur: NEGATIVE mg/dL
Nitrite: NEGATIVE
Protein, ur: NEGATIVE mg/dL
Specific Gravity, Urine: 1.003 — ABNORMAL LOW (ref 1.005–1.030)
pH: 8 (ref 5.0–8.0)

## 2022-06-23 LAB — WET PREP, GENITAL
Clue Cells Wet Prep HPF POC: NONE SEEN
Sperm: NONE SEEN
Trich, Wet Prep: NONE SEEN
WBC, Wet Prep HPF POC: >=10 — AB (ref <10)
Yeast Wet Prep HPF POC: NONE SEEN

## 2022-06-23 MED ORDER — ACETAMINOPHEN 500 MG PO TABS
1000.0000 mg | ORAL_TABLET | Freq: Once | ORAL | Status: AC
  Administered 2022-06-23: 1000 mg via ORAL
  Filled 2022-06-23: qty 2

## 2022-06-23 NOTE — MAU Note (Addendum)
...  Tricia Allen is a 28 y.o. at [redacted]w[redacted]d here in MAU reporting: Constant lower abdominal "period like cramps" that began around 0500 this morning. She reports the cramps remained constant but around 0745 the cramps became intermittent, "achey," and worse with movement. She is unsure of how often she is experiencing the cramps now. She reports while sitting at work she felt dizzy, had blurry vision, and felt "off" so she had someone take her blood pressure and it was 135/79. Denies VB. She reports yesterday she had once occurrence of "small leakage" that got onto her underwear. There was no color to the fluid that she could tell. +FM. Reports she felt no movement from 0500-0800. She reports his movements have returned to normal.  Low lying placenta noted on last [place] on 12/20. 1.8 cm from os. Next [place] scheduled for the 24th of this month.  Ate breakfast at 0700 - waffles.  Onset of complaint: 0500 this morning Pain score: 3/10 lower abdomen  FHT: 155 initial external Lab orders placed from triage: UA

## 2022-06-23 NOTE — MAU Provider Note (Signed)
History     CSN: [id number]  Arrival date and time: 06/23/22 3154   Event Date/Time   First Provider Initiated Contact with Patient 06/23/22 1018      Chief Complaint  Patient presents with   Hypertension   Abdominal Pain   HPI Tricia Allen is a 27 y.o. G2P1001 at [redacted]w[redacted]d who presents to MAU with chief complaint of abdominal pain. This is a new problem, onset this morning at 0500. Pain is "crampy" and located at both her suprapubic and periumbilical regions. Pain score is 3/10. Pain improves with rest. She has not taken medication for this complaint. She denies contractions, vaginal bleeding, LOF, fever or recent illness.  Patient also expresses concern for new onset Hypertension. She reports a reading of 135/79 at work earlier today. BP reading was preceded by an episode of feeling "dizzy" and "like something was off". Ob history includes Preeclampsia in previous pregnancy, normotensive in current pregnancy.   Patient receives care with [facility].   OB History     Gravida  2   Para  1   Term  1   Preterm      AB      Living  1      SAB      IAB      Ectopic      Multiple      Live Births  1           Past Medical History:  Diagnosis Date   Anxiety    Left ovarian cyst     Past Surgical History:  Procedure Laterality Date   TONSILLECTOMY     TONSILLECTOMY AND ADENOIDECTOMY      Family History  Problem Relation Age of Onset   Hypertension Father     Social History   Tobacco Use   Smoking status: Never   Smokeless tobacco: Never  Vaping Use   Vaping Use: Never used  Substance Use Topics   Alcohol use: No   Drug use: No    Allergies:  Allergies  Allergen Reactions   Codeine Other (See Comments)    hallucinations  hallucinations   hallucinations   hallucinations    No medications prior to admission.    Review of Systems  Gastrointestinal:  Positive for abdominal pain.  All other systems reviewed and are negative.  Physical  Exam   Blood pressure 102/69, pulse 98, temperature 98.4 F (36.9 C), temperature source Oral, resp. rate 15, height 5\' 2"  (1.575 m), weight 70.3 kg, last menstrual period 01/11/2022, SpO2 99 %.  Physical Exam Vitals and nursing note reviewed. Exam conducted with a chaperone present.  Constitutional:      Appearance: She is well-developed. She is not ill-appearing.  Cardiovascular:     Rate and Rhythm: Normal rate and regular rhythm.     Heart sounds: Normal heart sounds.  Pulmonary:     Effort: Pulmonary effort is normal.  Abdominal:     Tenderness: There is no abdominal tenderness.     Comments: Gravid  Genitourinary:    Comments: Pelvic exam: External genitalia normal, vaginal walls pink and well rugated, cervix visually closed, no lesions noted.    Skin:    Capillary Refill: Capillary refill takes less than 2 seconds.  Neurological:     Mental Status: She is alert and oriented to person, place, and time.  Psychiatric:        Mood and Affect: Mood normal.        Behavior: Behavior normal.  MAU Course  Procedures  MDM --EMR reviewed. Normotensive in current pregnancy. Offered patient one hour of BP checks. Will run [place] labs if any elevated readings.  --Ob hx term SVD x 1 in 2018. Normal cervical length per formal MFM ultrasound in current pregnancy. Cervix visually closed on speculum exam today.  Patient Vitals for the past 24 hrs:  BP Temp Temp src Pulse Resp SpO2 Height Weight  06/23/22 1100 102/69 -- -- 98 -- 99 % -- --  06/23/22 1045 101/63 -- -- 92 -- 100 % -- --  06/23/22 1030 102/60 -- -- 88 -- 100 % -- --  06/23/22 1020 108/70 -- -- 98 -- 100 % -- --  06/23/22 1011 103/64 98.4 F (36.9 C) Oral 91 15 99 % -- --  06/23/22 0955 -- -- -- -- -- -- 5\' 2"  (1.575 m) 70.3 kg   Results for orders placed or performed during the hospital encounter of 06/23/22 (from the past 24 hour(s))  Wet prep, genital     Status: Abnormal   Collection Time: 06/23/22 10:08 AM    Specimen: Vaginal  Result Value Ref Range   Yeast Wet Prep HPF POC NONE SEEN NONE SEEN   Trich, Wet Prep NONE SEEN NONE SEEN   Clue Cells Wet Prep HPF POC NONE SEEN NONE SEEN   WBC, Wet Prep HPF POC >=10 (A) <10   Sperm NONE SEEN   Urinalysis, Routine w reflex microscopic Urine, Clean Catch     Status: Abnormal   Collection Time: 06/23/22 10:27 AM  Result Value Ref Range   Color, Urine STRAW (A) YELLOW   APPearance CLEAR CLEAR   Specific Gravity, Urine 1.003 (L) 1.005 - 1.030   pH 8.0 5.0 - 8.0   Glucose, UA NEGATIVE NEGATIVE mg/dL   Hgb urine dipstick NEGATIVE NEGATIVE   Bilirubin Urine NEGATIVE NEGATIVE   Ketones, ur NEGATIVE NEGATIVE mg/dL   Protein, ur NEGATIVE NEGATIVE mg/dL   Nitrite NEGATIVE NEGATIVE   Leukocytes,Ua MODERATE (A) NEGATIVE   RBC / HPF 0-5 0 - 5 RBC/hpf   WBC, UA 0-5 0 - 5 WBC/hpf   Bacteria, UA FEW (A) NONE SEEN   Squamous Epithelial / HPF 0-5 0 - 5 /HPF   Meds ordered this encounter  Medications   acetaminophen (TYLENOL) tablet 1,000 mg   Assessment and Plan  --27 y.o. G2P1001 at [redacted]w[redacted]d  --Fetal tracing for gestational age --Quiet toco, Closed cervix --Musculoskeletal abdominal pain in second trimester --Normotensive in current pregnancy including MAU encounter --Discharge home in stable condition  Darlina Rumpf, MSA, MSN, CNM 06/23/2022, 11:55 AM

## 2022-06-26 LAB — GC/CHLAMYDIA PROBE AMP (~~LOC~~) NOT AT ARMC
Chlamydia: NEGATIVE
Comment: NEGATIVE
Comment: NORMAL
Neisseria Gonorrhea: NEGATIVE

## 2022-06-28 ENCOUNTER — Other Ambulatory Visit: Payer: Self-pay | Admitting: *Deleted

## 2022-06-28 ENCOUNTER — Ambulatory Visit: Payer: Medicaid Other | Attending: Maternal & Fetal Medicine

## 2022-06-28 ENCOUNTER — Ambulatory Visit: Payer: Medicaid Other | Admitting: *Deleted

## 2022-06-28 VITALS — BP 113/65 | HR 92

## 2022-06-28 DIAGNOSIS — O3503X0 Maternal care for (suspected) central nervous system malformation or damage in fetus, choroid plexus cysts, not applicable or unspecified: Secondary | ICD-10-CM | POA: Diagnosis not present

## 2022-06-28 DIAGNOSIS — Z8279 Family history of other congenital malformations, deformations and chromosomal abnormalities: Secondary | ICD-10-CM | POA: Diagnosis not present

## 2022-06-28 DIAGNOSIS — Z3A24 24 weeks gestation of pregnancy: Secondary | ICD-10-CM | POA: Diagnosis not present

## 2022-06-28 DIAGNOSIS — O09292 Supervision of pregnancy with other poor reproductive or obstetric history, second trimester: Secondary | ICD-10-CM

## 2022-06-28 DIAGNOSIS — O4440 Low lying placenta NOS or without hemorrhage, unspecified trimester: Secondary | ICD-10-CM | POA: Insufficient documentation

## 2022-06-28 DIAGNOSIS — O09299 Supervision of pregnancy with other poor reproductive or obstetric history, unspecified trimester: Secondary | ICD-10-CM

## 2022-06-30 DIAGNOSIS — J029 Acute pharyngitis, unspecified: Secondary | ICD-10-CM | POA: Diagnosis not present

## 2022-07-04 ENCOUNTER — Ambulatory Visit (INDEPENDENT_AMBULATORY_CARE_PROVIDER_SITE_OTHER): Payer: Medicaid Other

## 2022-07-04 VITALS — BP 107/64 | HR 93

## 2022-07-04 DIAGNOSIS — R3989 Other symptoms and signs involving the genitourinary system: Secondary | ICD-10-CM | POA: Diagnosis not present

## 2022-07-04 DIAGNOSIS — R109 Unspecified abdominal pain: Secondary | ICD-10-CM | POA: Diagnosis not present

## 2022-07-04 DIAGNOSIS — R82998 Other abnormal findings in urine: Secondary | ICD-10-CM

## 2022-07-04 NOTE — Progress Notes (Signed)
SUBJECTIVE: Tricia Allen is a 27 y.o. female who complains of urinary frequency and dark colored urine x 1 days, with flank pain. Patient denies fever, chills, or abnormal vaginal discharge or bleeding.   OBJECTIVE: Appears well, in no apparent distress.  Vital signs are normal. Patient is 24.[redacted] weeks pregnant and states good fetal movement. Fetal heart rate 140 bpm.  ASSESSMENT: Flank pain and dark colored urine  PLAN: Will culture urine.  Call or return to clinic prn if these symptoms worsen or fail to improve as anticipated.  Kathrene Alu RN

## 2022-07-05 ENCOUNTER — Encounter: Payer: Self-pay | Admitting: Cardiology

## 2022-07-05 ENCOUNTER — Ambulatory Visit (INDEPENDENT_AMBULATORY_CARE_PROVIDER_SITE_OTHER): Payer: Medicaid Other

## 2022-07-05 ENCOUNTER — Ambulatory Visit: Payer: Medicaid Other | Attending: Cardiology | Admitting: Cardiology

## 2022-07-05 VITALS — BP 94/60 | HR 98 | Ht 62.0 in | Wt 162.0 lb

## 2022-07-05 DIAGNOSIS — R002 Palpitations: Secondary | ICD-10-CM

## 2022-07-05 DIAGNOSIS — R0602 Shortness of breath: Secondary | ICD-10-CM

## 2022-07-05 NOTE — Progress Notes (Signed)
Cardio-Obstetrics Clinic  New Evaluation  Date:  07/09/2022   ID:  Tricia Allen, DOB 23-Oct-1995, MRN [id number]  PCP:  [facility] [place] [facility] Cardiologist:  Berniece Salines, DO  Electrophysiologist:  None       Referring MD: Manya Silvas, CNM   Chief Complaint:   History of Present Illness:   Tricia Allen is a 27 y.o. female [[id number]] who is being seen today for the evaluation of for shortness of breath  at the request of Tamala Julian, [place], [place].   Medical history includes preeclampsia, status post tonsillectomy, and anxiety here today to be evaluated for worsening shortness of breath on exertion as well as palpitations.  She tells me recently she has had worsening shortness of breath on exertion.  She is concerned about this.  But what really is problem is the fact that she has had an episode where she had intermittent palpitation leads to lightheadedness and dizziness.  She reports an episode in early January where she was experiencing significant palpitation passed out.  Went to the emergency department.  Workup was normal but she was asked to see cardia obstetrics.   Prior CV Studies Reviewed: The following studies were reviewed today:   Past Medical History:  Diagnosis Date   Anxiety    Left ovarian cyst     Past Surgical History:  Procedure Laterality Date   TONSILLECTOMY     TONSILLECTOMY AND ADENOIDECTOMY        OB History     Gravida  2   Para  1   Term  1   Preterm      AB      Living  1      SAB      IAB      Ectopic      Multiple      Live Births  1               Current Medications: Current Meds  Medication Sig   aspirin EC 81 MG tablet Take 1 tablet (81 mg total) by mouth daily. Take after 12 weeks for prevention of preeclampsia later in pregnancy   famotidine (PEPCID) 20 MG tablet Take 1 tablet (20 mg total) by mouth 2 (two) times daily as needed for heartburn or indigestion.   Prenatal  MV-Min-Fe Fum-FA-DHA (VITAFOL-OB+DHA) 65-1 & 250 MG MISC Take 1 capsule by mouth daily in the afternoon.     Allergies:   Codeine   Social History   Socioeconomic History   Marital status: Single    Spouse name: Not on file   Number of children: Not on file   Years of education: Not on file   Highest education level: Not on file  Occupational History   Not on file  Tobacco Use   Smoking status: Never   Smokeless tobacco: Never  Vaping Use   Vaping Use: Never used  Substance and Sexual Activity   Alcohol use: No   Drug use: No   Sexual activity: Yes    Birth control/protection: None  Other Topics Concern   Not on file  Social History Narrative   Not on file   Social Determinants of Health   Financial Resource Strain: Not on file  Food Insecurity: Not on file  Transportation Needs: Not on file  Physical Activity: Not on file  Stress: Not on file  Social Connections: Not on file      Family History  Problem Relation Age of  Onset   Hypertension Father       ROS:   Please see the history of present illness.    Shortness of breath on exertion All other systems reviewed and are negative.   Labs/EKG Reviewed:    EKG:   EKG is was not ordered today.  The ekg performed on June 05, 2022 demonstrates sinus rhythm  Recent Labs: 06/05/2022: ALT 12; BUN <5; Creatinine, Ser 0.54; Hemoglobin 11.3; Platelets 202; Potassium 3.9; Sodium 132   Recent Lipid Panel No results found for: "CHOL", "TRIG", "HDL", "CHOLHDL", "LDLCALC", "LDLDIRECT"  Physical Exam:    VS:  BP 94/60 (BP Location: Left Arm, Patient Position: Sitting, Cuff Size: Normal)   Pulse 98   Ht 5\' 2"  (1.575 m)   Wt 73.5 kg   LMP 01/11/2022 (Exact Date)   SpO2 99%   BMI 29.63 kg/m     Wt Readings from Last 3 Encounters:  07/05/22 73.5 kg  06/23/22 70.3 kg  06/20/22 70.3 kg     GEN:  Well nourished, well developed in no acute distress HEENT: Normal NECK: No JVD; No carotid bruits LYMPHATICS: No  lymphadenopathy CARDIAC: RRR, no murmurs, rubs, gallops RESPIRATORY:  Clear to auscultation without rales, wheezing or rhonchi  ABDOMEN: Soft, non-tender, non-distended MUSCULOSKELETAL:  No edema; No deformity  SKIN: Warm and dry NEUROLOGIC:  Alert and oriented x 3 PSYCHIATRIC:  Normal affect    Risk Assessment/Risk Calculators:     CARPREG II Risk Prediction Index Score:  1.  The patient's risk for a primary cardiac event is 5%.            ASSESSMENT & PLAN:     I would like to rule out a cardiovascular etiology of this palpitation, therefore at this time I would like to placed a zio patch for 7  days. In additon a transthoracic echocardiogram will be ordered to assess LV/RV function and any structural abnormalities. O  She is currently on aspirin 81 mg daily for preeclampsia prophylaxis advised the patient to continue this.   Patient Instructions  Medication Instructions:  Your physician recommends that you continue on your current medications as directed. Please refer to the Current Medication list given to you today.  *If you need a refill on your cardiac medications before your next appointment, please call your pharmacy*   Lab Work: None   Testing/Procedures: Your physician has requested that you have an echocardiogram. Echocardiography is a painless test that uses sound waves to create images of your heart. It provides your doctor with information about the size and shape of your heart and how well your heart's chambers and valves are working. This procedure takes approximately one hour. There are no restrictions for this procedure. Please do NOT wear cologne, perfume, aftershave, or lotions (deodorant is allowed). Please arrive 15 minutes prior to your appointment time.  ZIO XT- Long Term Monitor Instructions  Your physician has requested you wear a ZIO patch monitor for 7 days.  This is a single patch monitor. [employer] supplies one patch monitor per enrollment.  Additional stickers are not available. Please do not apply patch if you will be having a Nuclear Stress Test,  Echocardiogram, Cardiac CT, MRI, or Chest Xray during the period you would be wearing the  monitor. The patch cannot be worn during these tests. You cannot remove and re-apply the  ZIO XT patch monitor.  Your ZIO patch monitor will be mailed 3 day USPS to your address on file. It may take 3-5  days  to receive your monitor after you have been enrolled.  Once you have received your monitor, please review the enclosed instructions. Your monitor  has already been registered assigning a specific monitor serial # to you.  Billing and Patient Assistance Program Information  We have supplied [employer] with any of your insurance information on file for billing purposes. [employer] offers a sliding scale Patient Assistance Program for patients that do not have  insurance, or whose insurance does not completely cover the cost of the ZIO monitor.  You must apply for the Patient Assistance Program to qualify for this discounted rate.  To apply, please call [employer] at [phone], select option 4, select option 2, ask to apply for  Patient Assistance Program. Theodore Demark will ask your household income, and how many people  are in your household. They will quote your out-of-pocket cost based on that information.  [employer] will also be able to set up a 71-month, interest-free payment plan if needed.  Applying the monitor   Shave hair from upper left chest.  Hold abrader disc by orange tab. Rub abrader in 40 strokes over the upper left chest as  indicated in your monitor instructions.  Clean area with 4 enclosed alcohol pads. Let dry.  Apply patch as indicated in monitor instructions. Patch will be placed under collarbone on left  side of chest with arrow pointing upward.  Rub patch adhesive wings for 2 minutes. Remove white label marked "1". Remove the white  label marked "2". Rub patch adhesive wings  for 2 additional minutes.  While looking in a mirror, press and release button in center of patch. A small green light will  flash 3-4 times. This will be your only indicator that the monitor has been turned on.  Do not shower for the first 24 hours. You may shower after the first 24 hours.  Press the button if you feel a symptom. You will hear a small click. Record Date, Time and  Symptom in the Patient Logbook.  When you are ready to remove the patch, follow instructions on the last 2 pages of Patient  Logbook. Stick patch monitor onto the last page of Patient Logbook.  Place Patient Logbook in the blue and white box. Use locking tab on box and tape box closed  securely. The blue and white box has prepaid postage on it. Please place it in the mailbox as  soon as possible. Your physician should have your test results approximately 7 days after the  monitor has been mailed back to [facility]  Call [facility] at [phone] if you have questions regarding  your ZIO XT patch monitor. Call them immediately if you see an orange light blinking on your  monitor.  If your monitor falls off in less than 4 days, contact our Monitor department at [phone].  If your monitor becomes loose or falls off after 4 days call [employer] at [phone] for  suggestions on securing your monitor    Follow-Up: At [facility], you and your health needs are our priority.  As part of our continuing mission to provide you with exceptional heart care, we have created designated Provider Care Teams.  These Care Teams include your primary Cardiologist (physician) and Advanced Practice Providers (APPs -  Physician Assistants and Nurse Practitioners) who all work together to provide you with the care you need, when you need it.  We recommend signing up for the patient portal called "MyChart".  Sign up information is  provided on this After Visit Summary.  MyChart is used to connect with  patients for Virtual Visits (Telemedicine).  Patients are able to view lab/test results, encounter notes, upcoming appointments, etc.  Non-urgent messages can be sent to your provider as well.   To learn more about what you can do with MyChart, go to [email or web address].    Your next appointment:   10 week(s)  Provider:   Berniece Salines, DO   Dispo:  No follow-ups on file.   Medication Adjustments/Labs and Tests Ordered: Current medicines are reviewed at length with the patient today.  Concerns regarding medicines are outlined above.  Tests Ordered: Orders Placed This Encounter  Procedures   LONG TERM MONITOR (3-14 DAYS)   ECHOCARDIOGRAM COMPLETE   Medication Changes: No orders of the defined types were placed in this encounter.

## 2022-07-05 NOTE — Patient Instructions (Signed)
Medication Instructions:  Your physician recommends that you continue on your current medications as directed. Please refer to the Current Medication list given to you today.  *If you need a refill on your cardiac medications before your next appointment, please call your pharmacy*   Lab Work: None   Testing/Procedures: Your physician has requested that you have an echocardiogram. Echocardiography is a painless test that uses sound waves to create images of your heart. It provides your doctor with information about the size and shape of your heart and how well your heart's chambers and valves are working. This procedure takes approximately one hour. There are no restrictions for this procedure. Please do NOT wear cologne, perfume, aftershave, or lotions (deodorant is allowed). Please arrive 15 minutes prior to your appointment time.  ZIO XT- Long Term Monitor Instructions  Your physician has requested you wear a ZIO patch monitor for 7 days.  This is a single patch monitor. [employer] supplies one patch monitor per enrollment. Additional stickers are not available. Please do not apply patch if you will be having a Nuclear Stress Test,  Echocardiogram, Cardiac CT, MRI, or Chest Xray during the period you would be wearing the  monitor. The patch cannot be worn during these tests. You cannot remove and re-apply the  ZIO XT patch monitor.  Your ZIO patch monitor will be mailed 3 day USPS to your address on file. It may take 3-5 days  to receive your monitor after you have been enrolled.  Once you have received your monitor, please review the enclosed instructions. Your monitor  has already been registered assigning a specific monitor serial # to you.  Billing and Patient Assistance Program Information  We have supplied [employer] with any of your insurance information on file for billing purposes. [employer] offers a sliding scale Patient Assistance Program for patients that do not have  insurance,  or whose insurance does not completely cover the cost of the ZIO monitor.  You must apply for the Patient Assistance Program to qualify for this discounted rate.  To apply, please call [employer] at [phone], select option 4, select option 2, ask to apply for  Patient Assistance Program. Tricia Allen will ask your household income, and how many people  are in your household. They will quote your out-of-pocket cost based on that information.  [employer] will also be able to set up a 70-month interest-free payment plan if needed.  Applying the monitor   Shave hair from upper left chest.  Hold abrader disc by orange tab. Rub abrader in 40 strokes over the upper left chest as  indicated in your monitor instructions.  Clean area with 4 enclosed alcohol pads. Let dry.  Apply patch as indicated in monitor instructions. Patch will be placed under collarbone on left  side of chest with arrow pointing upward.  Rub patch adhesive wings for 2 minutes. Remove white label marked "1". Remove the white  label marked "2". Rub patch adhesive wings for 2 additional minutes.  While looking in a mirror, press and release button in center of patch. A small green light will  flash 3-4 times. This will be your only indicator that the monitor has been turned on.  Do not shower for the first 24 hours. You may shower after the first 24 hours.  Press the button if you feel a symptom. You will hear a small click. Record Date, Time and  Symptom in the Patient Logbook.  When you are ready to remove the patch, follow  instructions on the last 2 pages of Patient  Logbook. Stick patch monitor onto the last page of Patient Logbook.  Place Patient Logbook in the blue and white box. Use locking tab on box and tape box closed  securely. The blue and white box has prepaid postage on it. Please place it in the mailbox as  soon as possible. Your physician should have your test results approximately 7 days after the  monitor has been  mailed back to [facility].  Call [facility] at [phone] if you have questions regarding  your ZIO XT patch monitor. Call them immediately if you see an orange light blinking on your  monitor.  If your monitor falls off in less than 4 days, contact our Monitor department at [phone].  If your monitor becomes loose or falls off after 4 days call [employer] at [phone] for  suggestions on securing your monitor    Follow-Up: At [facility], you and your health needs are our priority.  As part of our continuing mission to provide you with exceptional heart care, we have created designated Provider Care Teams.  These Care Teams include your primary Cardiologist (physician) and Advanced Practice Providers (APPs -  Physician Assistants and Nurse Practitioners) who all work together to provide you with the care you need, when you need it.  We recommend signing up for the patient portal called "MyChart".  Sign up information is provided on this After Visit Summary.  MyChart is used to connect with patients for Virtual Visits (Telemedicine).  Patients are able to view lab/test results, encounter notes, upcoming appointments, etc.  Non-urgent messages can be sent to your provider as well.   To learn more about what you can do with MyChart, go to [email or web address].    Your next appointment:   10 week(s)  Provider:   Berniece Salines, DO

## 2022-07-05 NOTE — Progress Notes (Unsigned)
Enrolled for [employer] to mail a ZIO XT long term holter monitor to the patients address on file.  

## 2022-07-06 DIAGNOSIS — Z419 Encounter for procedure for purposes other than remedying health state, unspecified: Secondary | ICD-10-CM | POA: Diagnosis not present

## 2022-07-06 LAB — URINE CULTURE: Organism ID, Bacteria: NO GROWTH

## 2022-07-07 DIAGNOSIS — S39012A Strain of muscle, fascia and tendon of lower back, initial encounter: Secondary | ICD-10-CM | POA: Diagnosis not present

## 2022-07-09 DIAGNOSIS — R002 Palpitations: Secondary | ICD-10-CM

## 2022-07-12 DIAGNOSIS — E538 Deficiency of other specified B group vitamins: Secondary | ICD-10-CM | POA: Diagnosis not present

## 2022-07-18 ENCOUNTER — Encounter: Payer: Self-pay | Admitting: Advanced Practice Midwife

## 2022-07-18 ENCOUNTER — Ambulatory Visit (INDEPENDENT_AMBULATORY_CARE_PROVIDER_SITE_OTHER): Payer: Medicaid Other | Admitting: Advanced Practice Midwife

## 2022-07-18 VITALS — BP 110/63 | HR 90 | Wt 163.0 lb

## 2022-07-18 DIAGNOSIS — O4440 Low lying placenta NOS or without hemorrhage, unspecified trimester: Secondary | ICD-10-CM

## 2022-07-18 DIAGNOSIS — Z3492 Encounter for supervision of normal pregnancy, unspecified, second trimester: Secondary | ICD-10-CM | POA: Diagnosis not present

## 2022-07-18 DIAGNOSIS — Z8759 Personal history of other complications of pregnancy, childbirth and the puerperium: Secondary | ICD-10-CM

## 2022-07-18 DIAGNOSIS — Z3A26 26 weeks gestation of pregnancy: Secondary | ICD-10-CM

## 2022-07-18 DIAGNOSIS — H539 Unspecified visual disturbance: Secondary | ICD-10-CM

## 2022-07-18 DIAGNOSIS — O4442 Low lying placenta NOS or without hemorrhage, second trimester: Secondary | ICD-10-CM

## 2022-07-18 DIAGNOSIS — R000 Tachycardia, unspecified: Secondary | ICD-10-CM

## 2022-07-18 NOTE — Progress Notes (Signed)
   PRENATAL VISIT NOTE  Subjective:  Tricia Allen is a 27 y.o. G2P1001 at [redacted]w[redacted]d being seen today for ongoing prenatal care.  She is currently monitored for the following issues for this high-risk pregnancy and has Prior pregnancy with Fetal renal anomaly; Supervision of other normal pregnancy, antepartum; History of pre-eclampsia; Rubella non-immune status, antepartum; Chronic migraine with aura without status migrainosus, not intractable; Near syncope; and Anxiety on their problem list.  Patient reports  no further cramping, sees black spots in vision more frequently (no headaches) .  Contractions: Not present. Vag. Bleeding: None.  Movement: Present. Denies leaking of fluid.   The following portions of the patient's history were reviewed and updated as appropriate: allergies, current medications, past family history, past medical history, past social history, past surgical history and problem list.   Objective:   Vitals:   07/18/22 0832  BP: 110/63  Pulse: 90  Weight: 163 lb (73.9 kg)    Fetal Status: Fetal Heart Rate (bpm): 128 Fundal Height: 27 cm Movement: Present     General:  Alert, oriented and cooperative. Patient is in no acute distress.  Skin: Skin is warm and dry. No rash noted.   Cardiovascular: Normal heart rate noted  Respiratory: Normal respiratory effort, no problems with respiration noted  Abdomen: Soft, gravid, appropriate for gestational age.  Pain/Pressure: Absent     Pelvic: Cervical exam deferred        Extremities: Normal range of motion.  Edema: None  Mental Status: Normal mood and affect. Normal behavior. Normal judgment and thought content.   Assessment and Plan:  Pregnancy: G2P1001 at [redacted]w[redacted]d 1. [redacted] weeks gestation of pregnancy      Explained 1 hr vs 2 hr GTT - Glucose Tolerance, 2 Hours w/1 Hour - RPR - HIV antibody (with reflex) - CBC  2. History of pre-eclampsia    Normotensive today  3. Low-lying placenta    Resolved to posterior  4.  Tachycardia      Xio patch completed, scheduled for Echo        Results of xio pending  5. Vision changes      Normotensive      Explained as long as normotensive, likely not related to preeclampsia      Has Optho appt, but conflicts with echo, will reschedule  Preterm labor symptoms and general obstetric precautions including but not limited to vaginal bleeding, contractions, leaking of fluid and fetal movement were reviewed in detail with the patient. Please refer to After Visit Summary for other counseling recommendations.   Return in about 2 weeks (around 08/01/2022) for [facility].  Future Appointments  Date Time Provider [place]  07/27/2022  4:05 PM [facility] ECHO 5 [facility]  08/01/2022 11:15 AM Seabron Spates, CNM [facility] None  08/15/2022 11:15 AM Tresea Mall, CNM [facility] None  08/23/2022  3:30 PM [facility]-M[facility] NURSE [facility]  08/23/2022  3:45 PM WMC-MFC US4 W[facility]  08/29/2022 11:15 AM Gavin Pound, CNM [facility] None  09/14/2022  9:00 AM Tobb, Godfrey Pick, DO [facility] None    Hansel Feinstein, CNM

## 2022-07-19 LAB — CBC
Hematocrit: 29.8 % — ABNORMAL LOW (ref 34.0–46.6)
Hemoglobin: 10.1 g/dL — ABNORMAL LOW (ref 11.1–15.9)
MCH: 32 pg (ref 26.6–33.0)
MCHC: 33.9 g/dL (ref 31.5–35.7)
MCV: 94 fL (ref 79–97)
Platelets: 230 10*3/uL (ref 150–450)
RBC: 3.16 x10E6/uL — ABNORMAL LOW (ref 3.77–5.28)
RDW: 11.8 % (ref 11.7–15.4)
WBC: 8.2 10*3/uL (ref 3.4–10.8)

## 2022-07-19 LAB — GLUCOSE TOLERANCE, 2 HOURS W/ 1HR
Glucose, 1 hour: 142 mg/dL (ref 70–179)
Glucose, 2 hour: 102 mg/dL (ref 70–152)
Glucose, Fasting: 74 mg/dL (ref 70–91)

## 2022-07-19 LAB — HIV ANTIBODY (ROUTINE TESTING W REFLEX): HIV Screen 4th Generation wRfx: NONREACTIVE

## 2022-07-19 LAB — RPR: RPR Ser Ql: NONREACTIVE

## 2022-07-27 ENCOUNTER — Ambulatory Visit (HOSPITAL_COMMUNITY): Payer: Medicaid Other | Attending: Cardiology

## 2022-07-27 DIAGNOSIS — R0602 Shortness of breath: Secondary | ICD-10-CM | POA: Diagnosis not present

## 2022-07-27 LAB — ECHOCARDIOGRAM COMPLETE
Area-P 1/2: 4.96 cm2
S' Lateral: 3.03 cm

## 2022-07-28 ENCOUNTER — Encounter (HOSPITAL_COMMUNITY): Payer: Self-pay | Admitting: Obstetrics & Gynecology

## 2022-07-28 ENCOUNTER — Telehealth: Payer: Self-pay

## 2022-07-28 ENCOUNTER — Inpatient Hospital Stay (HOSPITAL_COMMUNITY)
Admission: AD | Admit: 2022-07-28 | Discharge: 2022-07-28 | Disposition: A | Discharge status: Home / Self Care | Payer: Medicaid Other | Attending: Obstetrics & Gynecology | Admitting: Obstetrics & Gynecology

## 2022-07-28 DIAGNOSIS — D649 Anemia, unspecified: Secondary | ICD-10-CM | POA: Insufficient documentation

## 2022-07-28 DIAGNOSIS — O26893 Other specified pregnancy related conditions, third trimester: Secondary | ICD-10-CM | POA: Diagnosis not present

## 2022-07-28 DIAGNOSIS — E162 Hypoglycemia, unspecified: Secondary | ICD-10-CM | POA: Insufficient documentation

## 2022-07-28 DIAGNOSIS — R42 Dizziness and giddiness: Secondary | ICD-10-CM | POA: Insufficient documentation

## 2022-07-28 DIAGNOSIS — Z3480 Encounter for supervision of other normal pregnancy, unspecified trimester: Secondary | ICD-10-CM

## 2022-07-28 DIAGNOSIS — O99013 Anemia complicating pregnancy, third trimester: Secondary | ICD-10-CM | POA: Insufficient documentation

## 2022-07-28 DIAGNOSIS — Z3A28 28 weeks gestation of pregnancy: Secondary | ICD-10-CM | POA: Diagnosis not present

## 2022-07-28 LAB — CBC WITH DIFFERENTIAL/PLATELET
Abs Immature Granulocytes: 0.06 10*3/uL (ref 0.00–0.07)
Basophils Absolute: 0 10*3/uL (ref 0.0–0.1)
Basophils Relative: 0 %
Eosinophils Absolute: 0.1 10*3/uL (ref 0.0–0.5)
Eosinophils Relative: 1 %
HCT: 27.9 % — ABNORMAL LOW (ref 36.0–46.0)
Hemoglobin: 9.5 g/dL — ABNORMAL LOW (ref 12.0–15.0)
Immature Granulocytes: 1 %
Lymphocytes Relative: 18 %
Lymphs Abs: 1.7 10*3/uL (ref 0.7–4.0)
MCH: 32.3 pg (ref 26.0–34.0)
MCHC: 34.1 g/dL (ref 30.0–36.0)
MCV: 94.9 fL (ref 80.0–100.0)
Monocytes Absolute: 0.6 10*3/uL (ref 0.1–1.0)
Monocytes Relative: 7 %
Neutro Abs: 6.8 10*3/uL (ref 1.7–7.7)
Neutrophils Relative %: 73 %
Platelets: 226 10*3/uL (ref 150–400)
RBC: 2.94 MIL/uL — ABNORMAL LOW (ref 3.87–5.11)
RDW: 12.6 % (ref 11.5–15.5)
WBC: 9.2 10*3/uL (ref 4.0–10.5)
nRBC: 0 % (ref 0.0–0.2)

## 2022-07-28 LAB — COMPREHENSIVE METABOLIC PANEL
ALT: 18 U/L (ref 0–44)
AST: 23 U/L (ref 15–41)
Albumin: 3.1 g/dL — ABNORMAL LOW (ref 3.5–5.0)
Alkaline Phosphatase: 48 U/L (ref 38–126)
Anion gap: 11 (ref 5–15)
BUN: <5 mg/dL — ABNORMAL LOW (ref 6–20)
CO2: 23 mmol/L (ref 22–32)
Calcium: 9.1 mg/dL (ref 8.9–10.3)
Chloride: 102 mmol/L (ref 98–111)
Creatinine, Ser: 0.58 mg/dL (ref 0.44–1.00)
GFR, Estimated: >60 mL/min (ref >60)
Glucose, Bld: 100 mg/dL — ABNORMAL HIGH (ref 70–99)
Potassium: 3.6 mmol/L (ref 3.5–5.1)
Sodium: 136 mmol/L (ref 135–145)
Total Bilirubin: 0.5 mg/dL (ref 0.3–1.2)
Total Protein: 6.1 g/dL — ABNORMAL LOW (ref 6.5–8.1)

## 2022-07-28 LAB — URINALYSIS, ROUTINE W REFLEX MICROSCOPIC
Bilirubin Urine: NEGATIVE
Glucose, UA: NEGATIVE mg/dL
Hgb urine dipstick: NEGATIVE
Ketones, ur: NEGATIVE mg/dL
Nitrite: NEGATIVE
Protein, ur: NEGATIVE mg/dL
Specific Gravity, Urine: 1.003 — ABNORMAL LOW (ref 1.005–1.030)
pH: 7 (ref 5.0–8.0)

## 2022-07-28 LAB — GLUCOSE, CAPILLARY: Glucose-Capillary: 107 mg/dL — ABNORMAL HIGH (ref 70–99)

## 2022-07-28 MED ORDER — FERROUS SULFATE 325 (65 FE) MG PO TABS: 325.0000 mg | ORAL_TABLET | ORAL | 0 refills | Status: DC

## 2022-07-28 NOTE — MAU Provider Note (Signed)
History     CSN: [id number]  Arrival date and time: 07/28/22 1144   None     Chief Complaint  Patient presents with   Hypoglycemia   HPI Tricia Allen is a 27 y.o. G2P1001 at 56w2dwho presents to MAU for hypoglycemia. She reports she was at work this morning and had a sudden onset of dizziness and felt as if she were going to pass out. She reports she checked her BP and BP was normal. She then checked her blood sugar and it was 67. She reports she took a glucose tablet, drank some juice and ate a [employer]. She checked her blood sugar again and it had dropped to 64. She also reports during this time, her feet felt numb and tingly. She reports she called OB office and was instructed to come in.   30 minutes prior to symptoms starting she had eaten a sausage breakfast bowl. She also had cereal earlier this morning. She reports she "eats all the time".   She reports this has happened before and [facility]. She denies chest pain, palpitation, or shortness of breath.   She denies OB concerns today. No contractions, vaginal bleeding or leaking fluid. She endorses active fetal movement.  [facility]/27.    OB History     Gravida  2   Para  1   Term  1   Preterm      AB      Living  1      SAB      IAB      Ectopic      Multiple      Live Births  1           Past Medical History:  Diagnosis Date   Anxiety    Left ovarian cyst     Past Surgical History:  Procedure Laterality Date   TONSILLECTOMY     TONSILLECTOMY AND ADENOIDECTOMY      Family History  Problem Relation Age of Onset   Hypertension Father     Social History   Tobacco Use   Smoking status: Never   Smokeless tobacco: Never  Vaping Use   Vaping Use: Never used  Substance Use Topics   Alcohol use: No   Drug use: No   Allergies:  Allergies  Allergen Reactions   Codeine Other (See Comments)     hallucinations  hallucinations   hallucinations   hallucinations    No medications prior to admission.   Review of Systems  Constitutional: Negative.   Neurological:  Positive for dizziness and light-headedness.  All other systems reviewed and are negative.  Physical Exam   Blood pressure 119/72, pulse (!) 102, temperature 98.9 F (37.2 C), temperature source Oral, resp. rate 16, height '5\' 2"'$  (1.575 m), weight 75.8 kg, last menstrual period 01/11/2022, SpO2 100 %.  Physical Exam Vitals and nursing note reviewed.  Constitutional:      General: She is not in acute distress. Eyes:     Extraocular Movements: Extraocular movements intact.     Pupils: Pupils are equal, round, and reactive to light.  Cardiovascular:     Rate and Rhythm: Regular rhythm. Tachycardia present.  Pulmonary:     Effort: Pulmonary effort is normal. No respiratory distress.     Breath sounds: Normal breath sounds.  Abdominal:     Palpations: Abdomen is soft.  Tenderness: There is no abdominal tenderness.     Comments: Gravid    Musculoskeletal:        General: Normal range of motion.     Cervical back: Normal range of motion.  Skin:    General: Skin is warm and dry.  Neurological:     General: No focal deficit present.     Mental Status: She is alert and oriented to person, place, and time.  Psychiatric:        Mood and Affect: Mood normal.        Behavior: Behavior normal.    NST: FHR: 130bpm, moderate variability, +10x10 accels, no decels Toco: quiet  Results for orders placed or performed during the hospital encounter of 07/28/22 (from the past 24 hour(s))  Glucose, capillary     Status: Abnormal   Collection Time: 07/28/22 12:18 PM  Result Value Ref Range   Glucose-Capillary 107 (H) 70 - 99 mg/dL  CBC with Differential/Platelet     Status: Abnormal   Collection Time: 07/28/22 12:48 PM  Result Value Ref Range   WBC 9.2 4.0 - 10.5 K/uL   RBC 2.94 (L) 3.87 - 5.11 MIL/uL    Hemoglobin 9.5 (L) 12.0 - 15.0 g/dL   HCT 27.9 (L) 36.0 - 46.0 %   MCV 94.9 80.0 - 100.0 fL   MCH 32.3 26.0 - 34.0 pg   MCHC 34.1 30.0 - 36.0 g/dL   RDW 12.6 11.5 - 15.5 %   Platelets 226 150 - 400 K/uL   nRBC 0.0 0.0 - 0.2 %   Neutrophils Relative % 73 %   Neutro Abs 6.8 1.7 - 7.7 K/uL   Lymphocytes Relative 18 %   Lymphs Abs 1.7 0.7 - 4.0 K/uL   Monocytes Relative 7 %   Monocytes Absolute 0.6 0.1 - 1.0 K/uL   Eosinophils Relative 1 %   Eosinophils Absolute 0.1 0.0 - 0.5 K/uL   Basophils Relative 0 %   Basophils Absolute 0.0 0.0 - 0.1 K/uL   Immature Granulocytes 1 %   Abs Immature Granulocytes 0.06 0.00 - 0.07 K/uL  Comprehensive metabolic panel     Status: Abnormal   Collection Time: 07/28/22 12:48 PM  Result Value Ref Range   Sodium 136 135 - 145 mmol/L   Potassium 3.6 3.5 - 5.1 mmol/L   Chloride 102 98 - 111 mmol/L   CO2 23 22 - 32 mmol/L   Glucose, Bld 100 (H) 70 - 99 mg/dL   BUN <5 (L) 6 - 20 mg/dL   Creatinine, Ser 0.58 0.44 - 1.00 mg/dL   Calcium 9.1 8.9 - 10.3 mg/dL   Total Protein 6.1 (L) 6.5 - 8.1 g/dL   Albumin 3.1 (L) 3.5 - 5.0 g/dL   AST 23 15 - 41 U/L   ALT 18 0 - 44 U/L   Alkaline Phosphatase 48 38 - 126 U/L   Total Bilirubin 0.5 0.3 - 1.2 mg/dL   GFR, Estimated >60 >60 mL/min   Anion gap 11 5 - 15  Urinalysis, Routine w reflex microscopic -Urine, Clean Catch     Status: Abnormal   Collection Time: 07/28/22 12:58 PM  Result Value Ref Range   Color, Urine STRAW (A) YELLOW   APPearance CLEAR CLEAR   Specific Gravity, Urine 1.003 (L) 1.005 - 1.030   pH 7.0 5.0 - 8.0   Glucose, UA NEGATIVE NEGATIVE mg/dL   Hgb urine dipstick NEGATIVE NEGATIVE   Bilirubin Urine NEGATIVE NEGATIVE   Ketones, ur NEGATIVE NEGATIVE  mg/dL   Protein, ur NEGATIVE NEGATIVE mg/dL   Nitrite NEGATIVE NEGATIVE   Leukocytes,Ua MODERATE (A) NEGATIVE   RBC / HPF 0-5 0 - 5 RBC/hpf   WBC, UA 0-5 0 - 5 WBC/hpf   Bacteria, UA RARE (A) NONE SEEN   Squamous Epithelial / HPF 0-5 0 - 5  /HPF   MAU Course  Procedures  MDM UA CBC, CMP CBG  CBG 107. CBC does show a mild anemia but is otherwise unremarkable. CMP unremarkable as well. Blood sugars are stable. I discussed frequent meals and snacks, focusing on protein and complex carbs. Avoid simple carbohydrates to reduce spike in blood sugar followed by drop. Reviewed low iron could be contributing to dizziness. Will send iron supplement to pharmacy.  Assessment and Plan   1. Supervision of other normal pregnancy, antepartum   2. [redacted] weeks gestation of pregnancy   3. Low blood sugar   4. Anemia affecting pregnancy in third trimester    - Discharge home in stable condition - Rx for iron supplement - Strict return precautions. Return to MAU for new/worsening symptoms - Keep OB appt as scheduled on 2/27   Renee Harder, [place] 07/28/2022, 2:58 PM

## 2022-07-28 NOTE — Telephone Encounter (Signed)
Patient called stating she was feeling light headed and checked her CBG. She states her CBG was 67.She states she ate a sausage bowl earlier and drank OJ then rechecked it 30 minutes later and her CBG was 67. Advised patient to recheck in 30 minutes and if CBG continues to drop and if she continues to feel light headed, we recommend she go to [facility] at [address] to be seen. Understanding was voiced. Gerda Yin l Amorie Rentz, CMA

## 2022-07-28 NOTE — MAU Note (Signed)
Tricia Allen is a 27 y.o. at 73w2dhere in MAU reporting: was at work, started feeling funny at work, light headed and off balance. Checked BP 121/78, checked blood sugar was 67- had eaten ~33m before.- drank oj, took a glucose tablet and has been sipping on a soda.  3038mlater she rechecked it was 64.  Still feeling a little light headed and dizzy. - OB told her to come in  Feet feel numb- started with the light headed feeling. Pt is not a diabetic; no hx of hypoglycemia  Denies any OB issue, no bleeding, LOF or pain, reports +FM. Onset of complaint: 1030 Pain score: none There were no vitals filed for this visit.   FHT:140 Lab orders placed from triage:  cbg

## 2022-08-01 ENCOUNTER — Ambulatory Visit (INDEPENDENT_AMBULATORY_CARE_PROVIDER_SITE_OTHER): Payer: Medicaid Other | Admitting: Advanced Practice Midwife

## 2022-08-01 VITALS — BP 111/66 | HR 101 | Wt 168.0 lb

## 2022-08-01 DIAGNOSIS — R42 Dizziness and giddiness: Secondary | ICD-10-CM

## 2022-08-01 DIAGNOSIS — Z2839 Other underimmunization status: Secondary | ICD-10-CM

## 2022-08-01 DIAGNOSIS — Z8759 Personal history of other complications of pregnancy, childbirth and the puerperium: Secondary | ICD-10-CM

## 2022-08-01 DIAGNOSIS — Z3A28 28 weeks gestation of pregnancy: Secondary | ICD-10-CM

## 2022-08-01 DIAGNOSIS — O09899 Supervision of other high risk pregnancies, unspecified trimester: Secondary | ICD-10-CM

## 2022-08-01 DIAGNOSIS — Z3480 Encounter for supervision of other normal pregnancy, unspecified trimester: Secondary | ICD-10-CM

## 2022-08-01 NOTE — Progress Notes (Signed)
   PRENATAL VISIT NOTE  Subjective:  Tricia Allen is a 27 y.o. G2P1001 at 62w6dbeing seen today for ongoing prenatal care.  She is currently monitored for the following issues for this low-risk pregnancy and has Prior pregnancy with Fetal renal anomaly; Supervision of other normal pregnancy, antepartum; History of pre-eclampsia; Rubella non-immune status, antepartum; Chronic migraine with aura without status migrainosus, not intractable; Near syncope; and Anxiety on their problem list.  Patient reports  continuing episodes of lightheadedness despite eating frequent protein, water, rest.   Blood sugar 64 with last episode, even with intake of juice.   .  Contractions: Not present. Vag. Bleeding: None.  Movement: Present. Denies leaking of fluid.   The following portions of the patient's history were reviewed and updated as appropriate: allergies, current medications, past family history, past medical history, past social history, past surgical history and problem list.   Objective:   Vitals:   08/01/22 1113  BP: 111/66  Pulse: (!) 101  Weight: 168 lb (76.2 kg)    Fetal Status: Fetal Heart Rate (bpm): 135   Movement: Present     General:  Alert, oriented and cooperative. Patient is in no acute distress.  Skin: Skin is warm and dry. No rash noted.   Cardiovascular: Normal heart rate noted  Respiratory: Normal respiratory effort, no problems with respiration noted  Abdomen: Soft, gravid, appropriate for gestational age.  Pain/Pressure: Absent     Pelvic: Cervical exam deferred        Extremities: Normal range of motion.  Edema: None  Mental Status: Normal mood and affect. Normal behavior. Normal judgment and thought content.   Assessment and Plan:  Pregnancy: G2P1001 at 255w6d. Supervision of other normal pregnancy, antepartum     Glucola normal   2. History of pre-eclampsia     BP normal so far  3. Rubella non-immune status, antepartum   4. [redacted] weeks gestation of  pregnancy   5. Postural dizziness with presyncope      Refer back to Cardiology for reevaluation, possible POTS workup      Will add a thyroid panel today just to cover all the bases - AMB Referral to [facility]  Preterm labor symptoms and general obstetric precautions including but not limited to vaginal bleeding, contractions, leaking of fluid and fetal movement were reviewed in detail with the patient. Please refer to After Visit Summary for other counseling recommendations.     Future Appointments  Date Time Provider [place]3/05/2023 11:15 AM HoMarcille Buffy, CNM [facility] None  08/23/2022  3:30 PM [facility]SE [facility]/20/2024  3:45 PM [facility]3/26/2024 11:15 AM EmGavin PoundCNM [facility] None  09/12/2022 11:15 AM LaJorje GuildNP [facility] None  09/14/2022  9:00 AM ToBerniece SalinesDO [facility] None  09/26/2022 10:55 AM LaJorje GuildNP [facility] None    MaHansel FeinsteinCNM

## 2022-08-02 ENCOUNTER — Encounter: Payer: Self-pay | Admitting: Advanced Practice Midwife

## 2022-08-03 ENCOUNTER — Telehealth: Payer: Self-pay

## 2022-08-03 DIAGNOSIS — R42 Dizziness and giddiness: Secondary | ICD-10-CM | POA: Diagnosis not present

## 2022-08-03 DIAGNOSIS — R55 Syncope and collapse: Secondary | ICD-10-CM | POA: Diagnosis not present

## 2022-08-03 NOTE — Telephone Encounter (Signed)
-----   Message from Seabron Spates, CNM sent at 08/02/2022 11:35 PM EST ----- Regarding: note for off work Can yall write her a work note to be off rest of the week?  Back on Monday for dizzy/fainting episodes  I told her if she needs longer would need FMLA papers  Tricia Allen

## 2022-08-03 NOTE — Telephone Encounter (Signed)
Letter created and sent to patient via mychart. Kathrene Alu RN

## 2022-08-04 DIAGNOSIS — Z419 Encounter for procedure for purposes other than remedying health state, unspecified: Secondary | ICD-10-CM | POA: Diagnosis not present

## 2022-08-04 LAB — THYROID PANEL WITH TSH
Free Thyroxine Index: 0.9 — ABNORMAL LOW (ref 1.2–4.9)
T3 Uptake Ratio: 10 % — ABNORMAL LOW (ref 24–39)
T4, Total: 9 ug/dL (ref 4.5–12.0)
TSH: 1.23 u[IU]/mL (ref 0.450–4.500)

## 2022-08-08 ENCOUNTER — Other Ambulatory Visit (HOSPITAL_COMMUNITY): Payer: Self-pay | Admitting: *Deleted

## 2022-08-08 ENCOUNTER — Other Ambulatory Visit: Payer: Self-pay | Admitting: Advanced Practice Midwife

## 2022-08-08 DIAGNOSIS — R42 Dizziness and giddiness: Secondary | ICD-10-CM

## 2022-08-08 DIAGNOSIS — D649 Anemia, unspecified: Secondary | ICD-10-CM

## 2022-08-08 NOTE — Progress Notes (Signed)
Hemoglobin 9.5 Per Dr Nehemiah Settle, needs IV Iron infusions  >>. Venofer infusions ordered, plan weekly x 2 doses  Orders placed for Iron studies, Ferritin, B12

## 2022-08-10 ENCOUNTER — Telehealth: Payer: Self-pay

## 2022-08-10 ENCOUNTER — Other Ambulatory Visit: Payer: Self-pay | Admitting: Family Medicine

## 2022-08-10 ENCOUNTER — Ambulatory Visit: Payer: Medicaid Other

## 2022-08-10 DIAGNOSIS — D509 Iron deficiency anemia, unspecified: Secondary | ICD-10-CM

## 2022-08-10 DIAGNOSIS — D649 Anemia, unspecified: Secondary | ICD-10-CM | POA: Diagnosis not present

## 2022-08-10 NOTE — Progress Notes (Signed)
Patient sent to lab for blood draw. Kathrene Alu RN

## 2022-08-10 NOTE — Telephone Encounter (Signed)
Letter created for patient to work no more than 8 hours a day for remainder of her pregnancy. Tricia Alu RN

## 2022-08-11 LAB — IRON,TIBC AND FERRITIN PANEL
Ferritin: 23 ng/mL (ref 15–150)
Iron Saturation: 7 % — CL (ref 15–55)
Iron: 36 ug/dL (ref 27–159)
Total Iron Binding Capacity: 510 ug/dL — ABNORMAL HIGH (ref 250–450)
UIBC: 474 ug/dL — ABNORMAL HIGH (ref 131–425)

## 2022-08-11 LAB — B12 AND FOLATE PANEL
Folate: >20 ng/mL (ref >3.0)
Vitamin B-12: 279 pg/mL (ref 232–1245)

## 2022-08-15 ENCOUNTER — Encounter: Payer: Medicaid Other | Admitting: Advanced Practice Midwife

## 2022-08-15 ENCOUNTER — Encounter: Payer: Self-pay | Admitting: Medical

## 2022-08-15 ENCOUNTER — Ambulatory Visit (INDEPENDENT_AMBULATORY_CARE_PROVIDER_SITE_OTHER): Payer: Medicaid Other | Admitting: Medical

## 2022-08-15 VITALS — BP 109/61 | HR 85 | Wt 171.0 lb

## 2022-08-15 DIAGNOSIS — Z3480 Encounter for supervision of other normal pregnancy, unspecified trimester: Secondary | ICD-10-CM

## 2022-08-15 DIAGNOSIS — Z2839 Other underimmunization status: Secondary | ICD-10-CM

## 2022-08-15 DIAGNOSIS — O09893 Supervision of other high risk pregnancies, third trimester: Secondary | ICD-10-CM

## 2022-08-15 DIAGNOSIS — O09899 Supervision of other high risk pregnancies, unspecified trimester: Secondary | ICD-10-CM

## 2022-08-15 DIAGNOSIS — Z8759 Personal history of other complications of pregnancy, childbirth and the puerperium: Secondary | ICD-10-CM

## 2022-08-15 DIAGNOSIS — Z3A30 30 weeks gestation of pregnancy: Secondary | ICD-10-CM

## 2022-08-15 DIAGNOSIS — O99013 Anemia complicating pregnancy, third trimester: Secondary | ICD-10-CM

## 2022-08-15 NOTE — Progress Notes (Signed)
   PRENATAL VISIT NOTE  Subjective:  Tricia Allen is a 27 y.o. G2P1001 at [redacted]w[redacted]d being seen today for ongoing prenatal care.  She is currently monitored for the following issues for this high-risk pregnancy and has Prior pregnancy with Fetal renal anomaly; Supervision of other normal pregnancy, antepartum; History of pre-eclampsia; Rubella non-immune status, antepartum; Chronic migraine with aura without status migrainosus, not intractable; Near syncope; Anxiety; and Anemia on their problem list.  Patient reports occasional contractions.  Contractions: Not present. Vag. Bleeding: None.  Movement: Present. Denies leaking of fluid.   The following portions of the patient's history were reviewed and updated as appropriate: allergies, current medications, past family history, past medical history, past social history, past surgical history and problem list.   Objective:   Vitals:   08/15/22 1029  BP: 109/61  Pulse: 85  Weight: 171 lb (77.6 kg)    Fetal Status: Fetal Heart Rate (bpm): 130   Movement: Present     General:  Alert, oriented and cooperative. Patient is in no acute distress.  Skin: Skin is warm and dry. No rash noted.   Cardiovascular: Normal heart rate noted  Respiratory: Normal respiratory effort, no problems with respiration noted  Abdomen: Soft, gravid, appropriate for gestational age.  Pain/Pressure: Present ("cramping")     Pelvic: Cervical exam deferred        Extremities: Normal range of motion.  Edema: None  Mental Status: Normal mood and affect. Normal behavior. Normal judgment and thought content.   Assessment and Plan:  Pregnancy: G2P1001 at [redacted]w[redacted]d 1. Supervision of other normal pregnancy, antepartum - Declined TDAP today, will consider at next visit  - Planning formula feeding  - Condoms for MOC - Desires IP circ for baby  - Will use [facility] with other child   2. History of pre-eclampsia - Normotensive today   3. Rubella non-immune status,  antepartum - PP MMR   4. Anemia affecting pregnancy in third trimester - IV Venofer scheduled tomorrow   5. Dizziness in pregnancy  - Continues to have intermittent dizziness, no syncope episodes  - Venofer tomorrow  - Advised increased PO hydration due to maternal hypotension  - Monitor symptoms to avoid syncope   6. [redacted] weeks gestation of pregnancy  Preterm labor symptoms and general obstetric precautions including but not limited to vaginal bleeding, contractions, leaking of fluid and fetal movement were reviewed in detail with the patient. Please refer to After Visit Summary for other counseling recommendations.   Return in about 2 weeks (around 08/29/2022) for LOB, any provider.  Future Appointments  Date Time Provider [place]  08/16/2022  8:00 AM MCINF-RM6 MC-[facility] None  08/23/2022  8:00 AM MCINF-RM5 MC-MC[facility] None  08/24/2022  3:30 PM W[facility] NURSE [facility]  08/24/2022  3:45 PM W[facility]-MFC US6 W[facility]  08/29/2022 11:15 AM Gavin Pound, CNM [facility] None  09/12/2022 11:15 AM Jorje Guild, NP [facility] None  09/14/2022  9:00 AM Tobb, Godfrey Pick, DO [facility] None  09/27/2022  8:35 AM Nehemiah Settle Tanna Savoy, DO [facility] None    Kerry Hough, PA-C

## 2022-08-16 ENCOUNTER — Encounter (HOSPITAL_COMMUNITY)
Admission: RE | Admit: 2022-08-16 | Discharge: 2022-08-16 | Disposition: A | Discharge status: Home / Self Care | Payer: Medicaid Other | Source: Ambulatory Visit | Attending: Advanced Practice Midwife | Admitting: Advanced Practice Midwife

## 2022-08-16 DIAGNOSIS — D509 Iron deficiency anemia, unspecified: Secondary | ICD-10-CM

## 2022-08-16 DIAGNOSIS — E538 Deficiency of other specified B group vitamins: Secondary | ICD-10-CM | POA: Diagnosis not present

## 2022-08-16 MED ORDER — IRON SUCROSE 500 MG IVPB - SIMPLE MED
500.0000 mg | INTRAVENOUS | Status: DC
  Filled 2022-08-16: qty 275

## 2022-08-16 NOTE — Progress Notes (Signed)
Pt spoke with OBGYN, decided not to get IV Venofer today. Keeping appt for next week and will let us know if decides to get next dose.

## 2022-08-21 ENCOUNTER — Other Ambulatory Visit (HOSPITAL_COMMUNITY): Payer: Self-pay | Admitting: *Deleted

## 2022-08-21 DIAGNOSIS — D509 Iron deficiency anemia, unspecified: Secondary | ICD-10-CM

## 2022-08-22 DIAGNOSIS — J020 Streptococcal pharyngitis: Secondary | ICD-10-CM | POA: Diagnosis not present

## 2022-08-23 ENCOUNTER — Inpatient Hospital Stay (HOSPITAL_COMMUNITY): Admission: RE | Admit: 2022-08-23 | Payer: Medicaid Other | Source: Ambulatory Visit

## 2022-08-23 ENCOUNTER — Ambulatory Visit: Payer: Medicaid Other

## 2022-08-24 ENCOUNTER — Ambulatory Visit: Payer: Medicaid Other | Admitting: *Deleted

## 2022-08-24 ENCOUNTER — Ambulatory Visit: Payer: Medicaid Other | Attending: Obstetrics

## 2022-08-24 VITALS — BP 115/69 | HR 94

## 2022-08-24 DIAGNOSIS — Z2839 Other underimmunization status: Secondary | ICD-10-CM | POA: Diagnosis not present

## 2022-08-24 DIAGNOSIS — O09899 Supervision of other high risk pregnancies, unspecified trimester: Secondary | ICD-10-CM | POA: Insufficient documentation

## 2022-08-24 DIAGNOSIS — O3503X0 Maternal care for (suspected) central nervous system malformation or damage in fetus, choroid plexus cysts, not applicable or unspecified: Secondary | ICD-10-CM | POA: Insufficient documentation

## 2022-08-24 DIAGNOSIS — O09893 Supervision of other high risk pregnancies, third trimester: Secondary | ICD-10-CM | POA: Diagnosis not present

## 2022-08-24 DIAGNOSIS — Z8279 Family history of other congenital malformations, deformations and chromosomal abnormalities: Secondary | ICD-10-CM

## 2022-08-24 DIAGNOSIS — Z3480 Encounter for supervision of other normal pregnancy, unspecified trimester: Secondary | ICD-10-CM | POA: Insufficient documentation

## 2022-08-24 DIAGNOSIS — O09299 Supervision of pregnancy with other poor reproductive or obstetric history, unspecified trimester: Secondary | ICD-10-CM | POA: Diagnosis present

## 2022-08-24 DIAGNOSIS — Z3A32 32 weeks gestation of pregnancy: Secondary | ICD-10-CM | POA: Insufficient documentation

## 2022-08-24 DIAGNOSIS — O09293 Supervision of pregnancy with other poor reproductive or obstetric history, third trimester: Secondary | ICD-10-CM | POA: Insufficient documentation

## 2022-08-24 DIAGNOSIS — Z8759 Personal history of other complications of pregnancy, childbirth and the puerperium: Secondary | ICD-10-CM

## 2022-08-24 DIAGNOSIS — O1493 Unspecified pre-eclampsia, third trimester: Secondary | ICD-10-CM | POA: Insufficient documentation

## 2022-08-28 ENCOUNTER — Inpatient Hospital Stay (HOSPITAL_COMMUNITY): Payer: Medicaid Other

## 2022-08-28 ENCOUNTER — Telehealth: Payer: Self-pay

## 2022-08-28 ENCOUNTER — Inpatient Hospital Stay (HOSPITAL_COMMUNITY)
Admission: AD | Admit: 2022-08-28 | Discharge: 2022-08-28 | Disposition: A | Discharge status: Home / Self Care | Payer: Medicaid Other | Attending: Family Medicine | Admitting: Family Medicine

## 2022-08-28 DIAGNOSIS — R079 Chest pain, unspecified: Secondary | ICD-10-CM | POA: Diagnosis not present

## 2022-08-28 DIAGNOSIS — R000 Tachycardia, unspecified: Secondary | ICD-10-CM | POA: Diagnosis not present

## 2022-08-28 DIAGNOSIS — O09293 Supervision of pregnancy with other poor reproductive or obstetric history, third trimester: Secondary | ICD-10-CM | POA: Diagnosis not present

## 2022-08-28 DIAGNOSIS — Z3A32 32 weeks gestation of pregnancy: Secondary | ICD-10-CM | POA: Diagnosis not present

## 2022-08-28 DIAGNOSIS — Z2839 Other underimmunization status: Secondary | ICD-10-CM | POA: Insufficient documentation

## 2022-08-28 DIAGNOSIS — Z3480 Encounter for supervision of other normal pregnancy, unspecified trimester: Secondary | ICD-10-CM

## 2022-08-28 DIAGNOSIS — Z8759 Personal history of other complications of pregnancy, childbirth and the puerperium: Secondary | ICD-10-CM

## 2022-08-28 DIAGNOSIS — O09899 Supervision of other high risk pregnancies, unspecified trimester: Secondary | ICD-10-CM

## 2022-08-28 DIAGNOSIS — O99891 Other specified diseases and conditions complicating pregnancy: Secondary | ICD-10-CM | POA: Diagnosis not present

## 2022-08-28 DIAGNOSIS — R0602 Shortness of breath: Secondary | ICD-10-CM | POA: Diagnosis not present

## 2022-08-28 LAB — COMPREHENSIVE METABOLIC PANEL
ALT: 13 U/L (ref 0–44)
AST: 16 U/L (ref 15–41)
Albumin: 3.2 g/dL — ABNORMAL LOW (ref 3.5–5.0)
Alkaline Phosphatase: 61 U/L (ref 38–126)
Anion gap: 11 (ref 5–15)
BUN: <5 mg/dL — ABNORMAL LOW (ref 6–20)
CO2: 22 mmol/L (ref 22–32)
Calcium: 9 mg/dL (ref 8.9–10.3)
Chloride: 104 mmol/L (ref 98–111)
Creatinine, Ser: 0.56 mg/dL (ref 0.44–1.00)
GFR, Estimated: >60 mL/min (ref >60)
Glucose, Bld: 93 mg/dL (ref 70–99)
Potassium: 3.4 mmol/L — ABNORMAL LOW (ref 3.5–5.1)
Sodium: 137 mmol/L (ref 135–145)
Total Bilirubin: 0.2 mg/dL — ABNORMAL LOW (ref 0.3–1.2)
Total Protein: 6.2 g/dL — ABNORMAL LOW (ref 6.5–8.1)

## 2022-08-28 LAB — CBC WITH DIFFERENTIAL/PLATELET
Abs Immature Granulocytes: 0.21 10*3/uL — ABNORMAL HIGH (ref 0.00–0.07)
Basophils Absolute: 0 10*3/uL (ref 0.0–0.1)
Basophils Relative: 0 %
Eosinophils Absolute: 0.1 10*3/uL (ref 0.0–0.5)
Eosinophils Relative: 1 %
HCT: 28.5 % — ABNORMAL LOW (ref 36.0–46.0)
Hemoglobin: 9.5 g/dL — ABNORMAL LOW (ref 12.0–15.0)
Immature Granulocytes: 2 %
Lymphocytes Relative: 18 %
Lymphs Abs: 1.9 10*3/uL (ref 0.7–4.0)
MCH: 31 pg (ref 26.0–34.0)
MCHC: 33.3 g/dL (ref 30.0–36.0)
MCV: 93.1 fL (ref 80.0–100.0)
Monocytes Absolute: 0.8 10*3/uL (ref 0.1–1.0)
Monocytes Relative: 7 %
Neutro Abs: 7.6 10*3/uL (ref 1.7–7.7)
Neutrophils Relative %: 72 %
Platelets: 222 10*3/uL (ref 150–400)
RBC: 3.06 MIL/uL — ABNORMAL LOW (ref 3.87–5.11)
RDW: 14.1 % (ref 11.5–15.5)
WBC: 10.6 10*3/uL — ABNORMAL HIGH (ref 4.0–10.5)
nRBC: 0 % (ref 0.0–0.2)

## 2022-08-28 LAB — BRAIN NATRIURETIC PEPTIDE: B Natriuretic Peptide: 49.9 pg/mL (ref 0.0–100.0)

## 2022-08-28 LAB — URINALYSIS, ROUTINE W REFLEX MICROSCOPIC
Bilirubin Urine: NEGATIVE
Glucose, UA: NEGATIVE mg/dL
Hgb urine dipstick: NEGATIVE
Ketones, ur: NEGATIVE mg/dL
Leukocytes,Ua: NEGATIVE
Nitrite: NEGATIVE
Protein, ur: NEGATIVE mg/dL
Specific Gravity, Urine: 1.012 (ref 1.005–1.030)
pH: 6 (ref 5.0–8.0)

## 2022-08-28 LAB — D-DIMER, QUANTITATIVE: D-Dimer, Quant: 4.33 ug/mL-FEU — ABNORMAL HIGH (ref 0.00–0.50)

## 2022-08-28 LAB — TROPONIN I (HIGH SENSITIVITY): Troponin I (High Sensitivity): 3 ng/L (ref <18)

## 2022-08-28 MED ORDER — ALBUTEROL SULFATE HFA 108 (90 BASE) MCG/ACT IN AERS
2.0000 | INHALATION_SPRAY | Freq: Once | RESPIRATORY_TRACT | Status: AC
  Administered 2022-08-28: 2 via RESPIRATORY_TRACT
  Filled 2022-08-28: qty 6.7

## 2022-08-28 MED ORDER — PANTOPRAZOLE SODIUM 40 MG PO TBEC: 40.0000 mg | DELAYED_RELEASE_TABLET | Freq: Every day | ORAL | 1 refills | Status: DC

## 2022-08-28 MED ORDER — IOHEXOL 350 MG/ML SOLN
75.0000 mL | Freq: Once | INTRAVENOUS | Status: AC | PRN
  Administered 2022-08-28: 75 mL via INTRAVENOUS

## 2022-08-28 NOTE — MAU Provider Note (Signed)
History     CSN: [id number]  Arrival date and time: 08/28/22 1508     Chief Complaint  Patient presents with   Hypertension   Blurred Vision   Shortness of Breath   HPI This is a 27 year old G2 P1-0-0-1 at 28 weeks and 2 days presents today with intermittent left sided sharp chest pain and SOB that started last night. When the chest pain was not sharp, she would have a pressure like something was sitting on her chest. Her symptoms would wake her up from sleep, gasping. She does sleep on her side. She has found that she is short of breath, even feels short of breath while talking. Her SOB is worse with exertion. Has some chest pain with deep inspiration. She went to work today, had SOB. Checked BP, which was 150s/90s.   Denies HA, nausea, vomiting, cough, fever, chills. No sick contacts. She was anemic on her 28 week labs. She was scheduled for iron infusion, but she decided not to get it. She is taking oral iron. Has remote history of asthma.   OB History     Gravida  2   Para  1   Term  1   Preterm      AB      Living  1      SAB      IAB      Ectopic      Multiple      Live Births  1           Past Medical History:  Diagnosis Date   Anxiety    Left ovarian cyst     Past Surgical History:  Procedure Laterality Date   TONSILLECTOMY     TONSILLECTOMY AND ADENOIDECTOMY      Family History  Problem Relation Age of Onset   Asthma Father    Hypertension Father    Heart disease Paternal Grandmother    Cancer Paternal Grandmother    Diabetes Paternal Grandfather     Social History   Tobacco Use   Smoking status: Never   Smokeless tobacco: Never  Vaping Use   Vaping Use: Never used  Substance Use Topics   Alcohol use: No   Drug use: No    Allergies:  Allergies  Allergen Reactions   Codeine Other (See Comments)    hallucinations  hallucinations   hallucinations   hallucinations    Medications Prior to Admission  Medication Sig  Dispense Refill Last Dose   amoxicillin (AMOXIL) 500 MG capsule Take 500 mg by mouth 2 (two) times daily.   08/28/2022   aspirin EC 81 MG tablet Take 1 tablet (81 mg total) by mouth daily. Take after 12 weeks for prevention of preeclampsia later in pregnancy 300 tablet 2 08/27/2022   Cyanocobalamin (VITAMIN B-12 IJ) Inject as directed.   Past Month   famotidine (PEPCID) 20 MG tablet Take 1 tablet (20 mg total) by mouth 2 (two) times daily as needed for heartburn or indigestion. 60 tablet 3 Past Week   Ferrous Sulfate (IRON PO) Take 1 tablet by mouth every morning.   08/27/2022   Prenatal MV-Min-Fe Fum-FA-DHA (VITAFOL-OB+DHA) 65-1 & 250 MG MISC Take 1 capsule by mouth daily in the afternoon.   08/27/2022    Review of Systems Physical Exam   Blood pressure 121/77, pulse (!) 110, temperature 98.3 F (36.8 C), temperature source Oral, resp. rate 18, weight 79.2 kg, last menstrual period 01/11/2022, SpO2 100 %.  Physical Exam Vitals  and nursing note reviewed.  Constitutional:      Appearance: She is well-developed.  HENT:     Head: Normocephalic and atraumatic.  Cardiovascular:     Rate and Rhythm: Tachycardia present.     Heart sounds: Normal heart sounds. No murmur heard.    No gallop.  Pulmonary:     Effort: Pulmonary effort is normal.     Breath sounds: No decreased breath sounds, wheezing, rhonchi or rales.  Chest:     Chest wall: Tenderness (mild left chest wall tenderness) present.  Abdominal:     Palpations: Abdomen is soft. There is no mass.     Tenderness: Tenderness: mild epigastric tenderness.  Skin:    Capillary Refill: Capillary refill takes less than 2 seconds.  Neurological:     General: No focal deficit present.     Mental Status: She is alert and oriented to person, place, and time.  Psychiatric:        Mood and Affect: Mood normal.        Behavior: Behavior normal.    Results for orders placed or performed during the hospital encounter of 08/28/22 (from the past  24 hour(s))  Urinalysis, Routine w reflex microscopic -Urine, Clean Catch     Status: Abnormal   Collection Time: 08/28/22  3:42 PM  Result Value Ref Range   Color, Urine YELLOW YELLOW   APPearance HAZY (A) CLEAR   Specific Gravity, Urine 1.012 1.005 - 1.030   pH 6.0 5.0 - 8.0   Glucose, UA NEGATIVE NEGATIVE mg/dL   Hgb urine dipstick NEGATIVE NEGATIVE   Bilirubin Urine NEGATIVE NEGATIVE   Ketones, ur NEGATIVE NEGATIVE mg/dL   Protein, ur NEGATIVE NEGATIVE mg/dL   Nitrite NEGATIVE NEGATIVE   Leukocytes,Ua NEGATIVE NEGATIVE  CBC with Differential/Platelet     Status: Abnormal   Collection Time: 08/28/22  4:21 PM  Result Value Ref Range   WBC 10.6 (H) 4.0 - 10.5 K/uL   RBC 3.06 (L) 3.87 - 5.11 MIL/uL   Hemoglobin 9.5 (L) 12.0 - 15.0 g/dL   HCT 28.5 (L) 36.0 - 46.0 %   MCV 93.1 80.0 - 100.0 fL   MCH 31.0 26.0 - 34.0 pg   MCHC 33.3 30.0 - 36.0 g/dL   RDW 14.1 11.5 - 15.5 %   Platelets 222 150 - 400 K/uL   nRBC 0.0 0.0 - 0.2 %   Neutrophils Relative % 72 %   Neutro Abs 7.6 1.7 - 7.7 K/uL   Lymphocytes Relative 18 %   Lymphs Abs 1.9 0.7 - 4.0 K/uL   Monocytes Relative 7 %   Monocytes Absolute 0.8 0.1 - 1.0 K/uL   Eosinophils Relative 1 %   Eosinophils Absolute 0.1 0.0 - 0.5 K/uL   Basophils Relative 0 %   Basophils Absolute 0.0 0.0 - 0.1 K/uL   Immature Granulocytes 2 %   Abs Immature Granulocytes 0.21 (H) 0.00 - 0.07 K/uL  Comprehensive metabolic panel     Status: Abnormal   Collection Time: 08/28/22  4:21 PM  Result Value Ref Range   Sodium 137 135 - 145 mmol/L   Potassium 3.4 (L) 3.5 - 5.1 mmol/L   Chloride 104 98 - 111 mmol/L   CO2 22 22 - 32 mmol/L   Glucose, Bld 93 70 - 99 mg/dL   BUN <5 (L) 6 - 20 mg/dL   Creatinine, Ser 0.56 0.44 - 1.00 mg/dL   Calcium 9.0 8.9 - 10.3 mg/dL   Total Protein 6.2 (L) 6.5 -  8.1 g/dL   Albumin 3.2 (L) 3.5 - 5.0 g/dL   AST 16 15 - 41 U/L   ALT 13 0 - 44 U/L   Alkaline Phosphatase 61 38 - 126 U/L   Total Bilirubin 0.2 (L) 0.3 - 1.2  mg/dL   GFR, Estimated >60 >60 mL/min   Anion gap 11 5 - 15  Brain natriuretic peptide     Status: None   Collection Time: 08/28/22  4:21 PM  Result Value Ref Range   B Natriuretic Peptide 49.9 0.0 - 100.0 pg/mL  Troponin I (High Sensitivity)     Status: None   Collection Time: 08/28/22  4:21 PM  Result Value Ref Range   Troponin I (High Sensitivity) 3 <18 ng/L  D-dimer, quantitative     Status: Abnormal   Collection Time: 08/28/22  4:21 PM  Result Value Ref Range   D-Dimer, Quant 4.33 (H) 0.00 - 0.50 ug/mL-FEU     MAU Course  Procedures NST:  Baseline: 130s  Variability: moderate Accelerations: present  Decelerations: none Contractions: none  MDM Check CBC, CMP, troponin, ddimer, BNP.   EKG independently read - sinus tachycardia with normal intervals. No T wave changes.  Positive DDimer - will get CTA. Discussed risks/benefits with patient.  Imaging: I independent reviewed the images of the CTA. No PE seen. Lung fields clear.   Assessment and Plan   1. Supervision of other normal pregnancy, antepartum   2. History of pre-eclampsia   3. Rubella non-immune status, antepartum   4. [redacted] weeks gestation of pregnancy   5. Chest pain, unspecified type    PE, ACS, effusion all ruled out.  Etiology of patient CP and SOB not entirely clear. Will put on acid reducer (protonix) at night and see if this is effective at reducing her CP and SOB. Will also maximize iron intake - just daily with vitamin C.  Has follow up in the office. She also has cardiology f/u. If she has worsening CP frequency, then patient to return.  Truett Mainland 08/28/2022, 4:13 PM

## 2022-08-28 NOTE — Telephone Encounter (Signed)
Patient called c/o shortness of breath. Patient states she checked her BP was 151/99 and 150/90. She states it was 131/90 in her left arm. Advised patient to go to [facility] at [facility] to be seen. Understanding was voiced. Lisia Westbay l Akaisha Truman, CMA

## 2022-08-28 NOTE — MAU Note (Signed)
.  Tricia Allen is a 27 y.o. at [redacted]w[redacted]d here in MAU reporting: woke up in the middle of the night gasping for air and has been SOB all day. She also reports Bps 150s/90s at work earlier and blurred vision. Over the last few weeks she has also felt tachycardic. Denies VB or LOF. +FM.  Pain score: 0 Vitals:   08/28/22 1520  BP: 122/80  Pulse: 99  Resp: 18  Temp: 98.6 F (37 C)     FHT:142 Lab orders placed from triage:  UA

## 2022-08-28 NOTE — MAU Note (Signed)
Patient states she woke up overnight several times with SOB and chest pain on the left. Reports SOB at work a short time ago and reports Bps 151/99 and 150/90. Denies HA, epigastric pain. Denies VB, LOF or DFM.  Reports seeing black dots in vision, but that this is not a new finding for her.

## 2022-08-28 NOTE — Progress Notes (Signed)
To radiology via transport services.

## 2022-08-29 ENCOUNTER — Ambulatory Visit (INDEPENDENT_AMBULATORY_CARE_PROVIDER_SITE_OTHER): Payer: Medicaid Other

## 2022-08-29 ENCOUNTER — Encounter: Payer: Self-pay | Admitting: Advanced Practice Midwife

## 2022-08-29 VITALS — BP 114/71 | HR 95 | Wt 172.0 lb

## 2022-08-29 DIAGNOSIS — O99013 Anemia complicating pregnancy, third trimester: Secondary | ICD-10-CM

## 2022-08-29 DIAGNOSIS — Z3480 Encounter for supervision of other normal pregnancy, unspecified trimester: Secondary | ICD-10-CM

## 2022-08-29 DIAGNOSIS — E031 Congenital hypothyroidism without goiter: Secondary | ICD-10-CM | POA: Insufficient documentation

## 2022-08-29 DIAGNOSIS — O99283 Endocrine, nutritional and metabolic diseases complicating pregnancy, third trimester: Secondary | ICD-10-CM

## 2022-08-29 DIAGNOSIS — Z3A32 32 weeks gestation of pregnancy: Secondary | ICD-10-CM

## 2022-08-29 NOTE — Progress Notes (Signed)
LOW-RISK PREGNANCY OFFICE VISIT  Patient name: Tricia Allen MRN [id number]  Date of birth: 1996-01-16 Chief Complaint:   Routine Prenatal Visit  Subjective:   Skilar Waage is a 27 y.o. G36P1001 female at [redacted]w[redacted]d with an Estimated Date of Delivery: 10/18/22 being seen today for ongoing management of a low-risk pregnancy aeb has Prior pregnancy with Fetal renal anomaly; Supervision of other normal pregnancy, antepartum; History of pre-eclampsia; Rubella non-immune status, antepartum; Chronic migraine with aura without status migrainosus, not intractable; Near syncope; Anxiety; and Anemia on their problem list.  Patient presents today, alone, with  no pregnancy related complaints .  However, she expresses concern with her CT scan, from yesterday, reporting a surgically absent right thyroid.  Patient endorses fetal movement. Patient denies abdominal cramping or contractions.  Patient denies vaginal concerns including abnormal discharge, leaking of fluid, and bleeding. No issues with urination, constipation, or diarrhea.    Contractions: Irritability. Vag. Bleeding: None.  Movement: Present.  Reviewed past medical,surgical, social, obstetrical and family history as well as problem list, medications and allergies.  Objective   Vitals:   08/29/22 1118  BP: 114/71  Pulse: 95  Weight: 172 lb (78 kg)  Body mass index is 31.46 kg/m.  Total Weight Gain:49 lb (22.2 kg)         Physical Examination:   General appearance: Well appearing, and in no distress  Mental status: Alert, oriented to person, place, and time  Skin: Warm & dry  Cardiovascular: Normal heart rate noted  Respiratory: Normal respiratory effort, no distress  Abdomen: Soft, gravid, nontender, AGA with Fundal Height: 34 cm  Pelvic: Cervical exam deferred           Extremities: Edema: None  Fetal Status: Fetal Heart Rate (bpm): 128  Movement: Present   Results for orders placed or performed during the hospital encounter of  08/28/22 (from the past 24 hour(s))  Urinalysis, Routine w reflex microscopic -Urine, Clean Catch   Collection Time: 08/28/22  3:42 PM  Result Value Ref Range   Color, Urine YELLOW YELLOW   APPearance HAZY (A) CLEAR   Specific Gravity, Urine 1.012 1.005 - 1.030   pH 6.0 5.0 - 8.0   Glucose, UA NEGATIVE NEGATIVE mg/dL   Hgb urine dipstick NEGATIVE NEGATIVE   Bilirubin Urine NEGATIVE NEGATIVE   Ketones, ur NEGATIVE NEGATIVE mg/dL   Protein, ur NEGATIVE NEGATIVE mg/dL   Nitrite NEGATIVE NEGATIVE   Leukocytes,Ua NEGATIVE NEGATIVE  CBC with Differential/Platelet   Collection Time: 08/28/22  4:21 PM  Result Value Ref Range   WBC 10.6 (H) 4.0 - 10.5 K/uL   RBC 3.06 (L) 3.87 - 5.11 MIL/uL   Hemoglobin 9.5 (L) 12.0 - 15.0 g/dL   HCT 28.5 (L) 36.0 - 46.0 %   MCV 93.1 80.0 - 100.0 fL   MCH 31.0 26.0 - 34.0 pg   MCHC 33.3 30.0 - 36.0 g/dL   RDW 14.1 11.5 - 15.5 %   Platelets 222 150 - 400 K/uL   nRBC 0.0 0.0 - 0.2 %   Neutrophils Relative % 72 %   Neutro Abs 7.6 1.7 - 7.7 K/uL   Lymphocytes Relative 18 %   Lymphs Abs 1.9 0.7 - 4.0 K/uL   Monocytes Relative 7 %   Monocytes Absolute 0.8 0.1 - 1.0 K/uL   Eosinophils Relative 1 %   Eosinophils Absolute 0.1 0.0 - 0.5 K/uL   Basophils Relative 0 %   Basophils Absolute 0.0 0.0 - 0.1 K/uL   Immature Granulocytes  2 %   Abs Immature Granulocytes 0.21 (H) 0.00 - 0.07 K/uL  Comprehensive metabolic panel   Collection Time: 08/28/22  4:21 PM  Result Value Ref Range   Sodium 137 135 - 145 mmol/L   Potassium 3.4 (L) 3.5 - 5.1 mmol/L   Chloride 104 98 - 111 mmol/L   CO2 22 22 - 32 mmol/L   Glucose, Bld 93 70 - 99 mg/dL   BUN <5 (L) 6 - 20 mg/dL   Creatinine, Ser 0.56 0.44 - 1.00 mg/dL   Calcium 9.0 8.9 - 10.3 mg/dL   Total Protein 6.2 (L) 6.5 - 8.1 g/dL   Albumin 3.2 (L) 3.5 - 5.0 g/dL   AST 16 15 - 41 U/L   ALT 13 0 - 44 U/L   Alkaline Phosphatase 61 38 - 126 U/L   Total Bilirubin 0.2 (L) 0.3 - 1.2 mg/dL   GFR, Estimated >60 >60 mL/min    Anion gap 11 5 - 15  Brain natriuretic peptide   Collection Time: 08/28/22  4:21 PM  Result Value Ref Range   B Natriuretic Peptide 49.9 0.0 - 100.0 pg/mL  D-dimer, quantitative   Collection Time: 08/28/22  4:21 PM  Result Value Ref Range   D-Dimer, Quant 4.33 (H) 0.00 - 0.50 ug/mL-FEU  Troponin I (High Sensitivity)   Collection Time: 08/28/22  4:21 PM  Result Value Ref Range   Troponin I (High Sensitivity) 3 <18 ng/L    Assessment & Plan:  Low-risk pregnancy of a 27 y.o., G2P1001 at [redacted]w[redacted]d with an Estimated Date of Delivery: 10/18/22   1. Supervision of other normal pregnancy, antepartum -Anticipatory guidance for upcoming appts. -Patient to schedule next appt in 2-3 weeks for an in-person visit. -Reviewed CT scan with Dr. Joyice Faster via phone. 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  Instructed to inform patient that since TSH normal, no further evaluation necessary at this time. -Patient updated and without questions.   2. Anemia affecting pregnancy in third trimester -Taking iron supplement as well as PNV.   3. [redacted] weeks gestation of pregnancy -Doing well. 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  -Denies anxiety      Meds: No orders of the defined types were placed in this encounter.  Labs/procedures today:  Lab Orders  No laboratory test(s) ordered today     Reviewed: Preterm labor symptoms and general obstetric precautions including but not limited to vaginal bleeding, contractions, leaking of fluid and fetal movement were reviewed in detail with the patient.  All questions were answered.  Follow-up: Return for LROB.  No orders of the defined types were placed in this encounter.  Maryann Conners MSN, CNM 08/29/2022

## 2022-09-04 DIAGNOSIS — Z419 Encounter for procedure for purposes other than remedying health state, unspecified: Secondary | ICD-10-CM | POA: Diagnosis not present

## 2022-09-12 ENCOUNTER — Encounter: Payer: Self-pay | Admitting: Student

## 2022-09-12 ENCOUNTER — Ambulatory Visit (INDEPENDENT_AMBULATORY_CARE_PROVIDER_SITE_OTHER): Payer: Medicaid Other | Admitting: Student

## 2022-09-12 VITALS — BP 109/58 | HR 104 | Wt 174.0 lb

## 2022-09-12 DIAGNOSIS — Z3480 Encounter for supervision of other normal pregnancy, unspecified trimester: Secondary | ICD-10-CM

## 2022-09-12 DIAGNOSIS — Z3A34 34 weeks gestation of pregnancy: Secondary | ICD-10-CM

## 2022-09-12 DIAGNOSIS — Z3483 Encounter for supervision of other normal pregnancy, third trimester: Secondary | ICD-10-CM

## 2022-09-12 NOTE — Progress Notes (Signed)
   PRENATAL VISIT NOTE  Subjective:  Tricia Allen is a 27 y.o. G2P1001 at [redacted]w[redacted]d being seen today for ongoing prenatal care.  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  Patient reports  vaginal discharge & cramping. Reports increase in mucoid discharge, no vaginal symptoms. Has also had more frequent braxton hicks since Sunday; tightening, not painful .  Contractions: Irritability. Vag. Bleeding: None.  Movement: Present. Denies leaking of fluid.   The following portions of the patient's history were reviewed and updated as appropriate: allergies, current medications, past family history, past medical history, past social history, past surgical history and problem list.   Objective:   Vitals:   09/12/22 1106  BP: (!) 109/58  Pulse: (!) 104  Weight: 174 lb (78.9 kg)    Fetal Status: Fetal Heart Rate (bpm): 151 Fundal Height: 34 cm Movement: Present     General:  Alert, oriented and cooperative. Patient is in no acute distress.  Skin: Skin is warm and dry. No rash noted.   Cardiovascular: Normal heart rate noted  Respiratory: Normal respiratory effort, no problems with respiration noted  Abdomen: Soft, gravid, appropriate for gestational age.  Pain/Pressure: Present     Pelvic: Cervical exam deferred        Extremities: Normal range of motion.  Edema: Trace  Mental Status: Normal mood and affect. Normal behavior. Normal judgment and thought content.   Assessment and Plan:  Pregnancy: G2P1001 at [redacted]w[redacted]d 1. Supervision of other normal pregnancy, antepartum -Reviewed labor precautions & reasons to present to MAU. Discussed  upcoming visit & GBS at [place].  -Has OB cards appointment on Thursday due to history of SOB & tachycardia during the pregnancy. Currently asymptomatic.   2. [redacted] weeks gestation of pregnancy   Preterm labor symptoms and general obstetric precautions including but not limited to vaginal bleeding, contractions, leaking of fluid and fetal movement were reviewed in detail with the patient. Please refer to After Visit Summary for other counseling recommendations.     Future Appointments  Date Time Provider Department Center  09/14/2022  9:00 AM Tobb, Lavona Mound, DO [facility] None  09/27/2022  8:35 AM Levie Heritage, DO [facility] None  10/05/2022  8:55 AM Constant, Gigi Gin, MD [facility] None  10/12/2022 11:15 AM Reva Bores, MD [facility] None    Judeth Horn, NP

## 2022-09-14 ENCOUNTER — Ambulatory Visit: Payer: Medicaid Other | Attending: Cardiology | Admitting: Cardiology

## 2022-09-14 ENCOUNTER — Ambulatory Visit (INDEPENDENT_AMBULATORY_CARE_PROVIDER_SITE_OTHER): Payer: Medicaid Other | Admitting: Family Medicine

## 2022-09-14 VITALS — BP 116/72 | HR 111 | Ht 62.0 in | Wt 172.8 lb

## 2022-09-14 VITALS — BP 132/69 | HR 94 | Wt 176.0 lb

## 2022-09-14 DIAGNOSIS — O09899 Supervision of other high risk pregnancies, unspecified trimester: Secondary | ICD-10-CM

## 2022-09-14 DIAGNOSIS — R002 Palpitations: Secondary | ICD-10-CM | POA: Diagnosis not present

## 2022-09-14 DIAGNOSIS — Z8759 Personal history of other complications of pregnancy, childbirth and the puerperium: Secondary | ICD-10-CM

## 2022-09-14 DIAGNOSIS — O09893 Supervision of other high risk pregnancies, third trimester: Secondary | ICD-10-CM

## 2022-09-14 DIAGNOSIS — Z2839 Other underimmunization status: Secondary | ICD-10-CM

## 2022-09-14 DIAGNOSIS — Z3A35 35 weeks gestation of pregnancy: Secondary | ICD-10-CM

## 2022-09-14 DIAGNOSIS — Z3480 Encounter for supervision of other normal pregnancy, unspecified trimester: Secondary | ICD-10-CM
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
## 2022-09-14 NOTE — Progress Notes (Signed)
Patient complaining of strong period like cramping for the last hour. Requested to be checked.

## 2022-09-14 NOTE — Progress Notes (Unsigned)
Cardio-Obstetrics Clinic  New Evaluation  Date:  09/16/2022   ID:  Tricia Allen Apachito, DOB Mar 31, 1996, MRN [id number]  [facility]:  [facility]   [facility] Providers Cardiologist:  Thomasene RippleKardie Kinney Sackmann, DO  Electrophysiologist:  None       Referring MD: [facility]   Chief Complaint:   History of Present Illness:   Tricia Allen Boisselle is a 27 y.o. female [G2P1001] who is being seen today for the evaluation of for shortness of breath  at the request of [facility]   Medical history includes preeclampsia, status post tonsillectomy, and anxiety here today to be evaluated for worsening shortness of breath on exertion as well as palpitations.  She tells me recently she has had worsening shortness of breath on exertion.  She is concerned about this.  But what really is problem is the fact that she has had an episode where she had intermittent palpitation leads to lightheadedness and dizziness.  She reports an episode in early January where she was experiencing significant palpitation passed out.  Went to the emergency department.  Workup was normal but she was asked to see cardia obstetrics.  At her last visit I sent the patient for echocardiogram as well as a ZIO monitor.  She was able to get these done they were all within normal limits.  She still is experiencing palpitations however asymptomatic in terms of lightheadedness or dizziness and has not passed out.  She has some shortness of breath but appears to be current baseline.   Prior CV Studies Reviewed: The following studies were reviewed today: Zio monitor 07/2022  Patch Wear Time:  7 days and 2 hours (2024-02-04T13:18:57-0500 to 2024-02-11T15:24:13-0500)   Patient had a min HR of 71 bpm, max HR of 170 bpm, and avg HR of 101 bpm. Predominant underlying rhythm was Sinus Rhythm. Isolated SVEs were rare (<1.0%), SVE Couplets were rare (<1.0%), and no SVE Triplets were present. Isolated VEs were rare  (<1.0%),  and no VE Couplets or VE Triplets were present.    Symptoms associated with Sinus rhythm and premature ventricular complexes.   Conclusion: Normal/Unremarkable study.   TTE 07/27/2022 IMPRESSIONS     1. Left ventricular ejection fraction, by estimation, is 55 to 60%. The  left ventricle has normal function. The left ventricle has no regional  wall motion abnormalities. Left ventricular diastolic parameters were  normal. The average left ventricular  global longitudinal strain is -21.9 %. The global longitudinal strain is  normal.   2. Right ventricular systolic function is normal. The right ventricular  size is normal.   3. The mitral valve is normal in structure. Trivial mitral valve  regurgitation. No evidence of mitral stenosis.   4. The aortic valve is tricuspid. Aortic valve regurgitation is not  visualized. No aortic stenosis is present.   5. The inferior vena cava is normal in size with greater than 50%  respiratory variability, suggesting right atrial pressure of 3 mmHg.   Comparison(s): No prior Echocardiogram.   FINDINGS   Left Ventricle: Left ventricular ejection fraction, by estimation, is 55  to 60%. The left ventricle has normal function. The left ventricle has no  regional wall motion abnormalities. The average left ventricular global  longitudinal strain is -21.9 %.  The global longitudinal strain is normal. The left ventricular internal  cavity size was normal in size. There is no left ventricular hypertrophy.  Left ventricular diastolic parameters were normal.   Right Ventricle: The right ventricular size is normal.  Right ventricular  systolic function is normal.   Left Atrium: Left atrial size was normal in size.   Right Atrium: Right atrial size was normal in size.   Pericardium: There is no evidence of pericardial effusion.   Mitral Valve: The mitral valve is normal in structure. Trivial mitral  valve regurgitation. No evidence of mitral  valve stenosis.   Tricuspid Valve: The tricuspid valve is normal in structure. Tricuspid  valve regurgitation is trivial. No evidence of tricuspid stenosis.   Aortic Valve: The aortic valve is tricuspid. Aortic valve regurgitation is  not visualized. No aortic stenosis is present.   Pulmonic Valve: The pulmonic valve was normal in structure. Pulmonic valve  regurgitation is trivial. No evidence of pulmonic stenosis.   Aorta: The aortic root is normal in size and structure.   Venous: The inferior vena cava is normal in size with greater than 50%  respiratory variability, suggesting right atrial pressure of 3 mmHg.   IAS/Shunts: No atrial level shunt detected by color flow Doppler.       Past Medical History:  Diagnosis Date   Anxiety    Left ovarian cyst     Past Surgical History:  Procedure Laterality Date   TONSILLECTOMY AND ADENOIDECTOMY        OB History     Gravida  2   Para  1   Term  1   Preterm      AB      Living  1      SAB      IAB      Ectopic      Multiple      Live Births  1               Current Medications: Current Meds  Medication Sig   aspirin EC 81 MG tablet Take 1 tablet (81 mg total) by mouth daily. Take after 12 weeks for prevention of preeclampsia later in pregnancy   Cyanocobalamin (VITAMIN B-12 IJ) Inject as directed.   Ferrous Sulfate (IRON PO) Take 1 tablet by mouth every morning.   pantoprazole (PROTONIX) 40 MG tablet Take 1 tablet (40 mg total) by mouth at bedtime.   Prenatal MV-Min-Fe Fum-FA-DHA (VITAFOL-OB+DHA) 65-1 & 250 MG MISC Take 1 capsule by mouth daily in the afternoon.     Allergies:   Codeine   Social History   Socioeconomic History   Marital status: Single    Spouse name: Not on file   Number of children: Not on file   Years of education: Not on file   Highest education level: Not on file  Occupational History   Not on file  Tobacco Use   Smoking status: Never   Smokeless tobacco: Never   Vaping Use   Vaping Use: Never used  Substance and Sexual Activity   Alcohol use: No   Drug use: No   Sexual activity: Yes    Birth control/protection: None  Other Topics Concern   Not on file  Social History Narrative   Not on file   Social Determinants of Health   Financial Resource Strain: Not on file  Food Insecurity: Not on file  Transportation Needs: Not on file  Physical Activity: Not on file  Stress: Not on file  Social Connections: Not on file      Family History  Problem Relation Age of Onset   Asthma Father    Hypertension Father    Heart disease Paternal Grandmother    Cancer  Paternal Grandmother    Diabetes Paternal Grandfather       ROS:   Please see the history of present illness.    Shortness of breath on exertion All other systems reviewed and are negative.   Labs/EKG Reviewed:    EKG:   EKG is was not ordered today.  The ekg performed on June 05, 2022 demonstrates sinus rhythm  Recent Labs: 08/03/2022: TSH 1.230 08/28/2022: ALT 13; B Natriuretic Peptide 49.9; BUN <5; Creatinine, Ser 0.56; Hemoglobin 9.5; Platelets 222; Potassium 3.4; Sodium 137   Recent Lipid Panel No results found for: "CHOL", "TRIG", "HDL", "CHOLHDL", "LDLCALC", "LDLDIRECT"  Physical Exam:    VS:  BP 116/72   Pulse (!) 111   Ht 5\' 2"  (1.575 m)   Wt 172 lb 12.8 oz (78.4 kg)   LMP 01/11/2022 (Exact Date)   SpO2 98%   BMI 31.61 kg/m     Wt Readings from Last 3 Encounters:  09/14/22 176 lb (79.8 kg)  09/14/22 172 lb 12.8 oz (78.4 kg)  09/12/22 174 lb (78.9 kg)     GEN:  Well nourished, well developed in no acute distress HEENT: Normal NECK: No JVD; No carotid bruits LYMPHATICS: No lymphadenopathy CARDIAC: RRR, no murmurs, rubs, gallops RESPIRATORY:  Clear to auscultation without rales, wheezing or rhonchi  ABDOMEN: Soft, non-tender, non-distended MUSCULOSKELETAL:  No edema; No deformity  SKIN: Warm and dry NEUROLOGIC:  Alert and oriented x 3 PSYCHIATRIC:   Normal affect    Risk Assessment/Risk Calculators:                  ASSESSMENT & PLAN:    Palpitation  Shortness of breath  Will continue to monitor the patient closely.  At this time no need for any additional medication outside of her aspirin.  I have discussed with patient potential warning signs to notify my office.  She is currently on aspirin 81 mg daily for preeclampsia prophylaxis advised the patient to continue this.   Patient Instructions  Medication Instructions:  Your physician recommends that you continue on your current medications as directed. Please refer to the Current Medication list given to you today.  *If you need a refill on your cardiac medications before your next appointment, please call your pharmacy*   Lab Work: None   Testing/Procedures: None   Follow-Up: At [facility], you and your health needs are our priority.  As part of our continuing mission to provide you with exceptional heart care, we have created designated Provider Care Teams.  These Care Teams include your primary Cardiologist (physician) and Advanced Practice Providers (APPs -  Physician Assistants and Nurse Practitioners) who all work together to provide you with the care you need, when you need it.   Your next appointment:   12 week(s) postpartum via MyChart  Provider:   Thomasene Ripple, DO      Dispo:  No follow-ups on file.   Medication Adjustments/Labs and Tests Ordered: Current medicines are reviewed at length with the patient today.  Concerns regarding medicines are outlined above.  Tests Ordered: No orders of the defined types were placed in this encounter.  Medication Changes: No orders of the defined types were placed in this encounter.

## 2022-09-14 NOTE — Patient Instructions (Signed)
Medication Instructions:  Your physician recommends that you continue on your current medications as directed. Please refer to the Current Medication list given to you today.  *If you need a refill on your cardiac medications before your next appointment, please call your pharmacy*   Lab Work: None   Testing/Procedures: None   Follow-Up: At [facility], you and your health needs are our priority.  As part of our continuing mission to provide you with exceptional heart care, we have created designated Provider Care Teams.  These Care Teams include your primary Cardiologist (physician) and Advanced Practice Providers (APPs -  Physician Assistants and Nurse Practitioners) who all work together to provide you with the care you need, when you need it.   Your next appointment:   12 week(s) postpartum via MyChart  Provider:   Thomasene Ripple, DO

## 2022-09-16 ENCOUNTER — Encounter: Payer: Self-pay | Admitting: Cardiology

## 2022-09-18 ENCOUNTER — Encounter: Payer: Self-pay | Admitting: Family Medicine

## 2022-09-19 NOTE — Telephone Encounter (Signed)
I tried to call patient - went to voicemail. Concerned about DVE. Patient should come to hospital for dopplers and to make sure she doesn't have a DVT.

## 2022-09-19 NOTE — Telephone Encounter (Signed)
Called patient and discussed symptoms. Leg pain improved. Bilateral mild swelling. She is on her feet constantly. As pain improving, swelling not unilateral, less likely DVE. Recommended elevation of legs. 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 If pain in legs increase again, advised to come to MAU for evaluation.

## 2022-09-21 DIAGNOSIS — J988 Other specified respiratory disorders: Secondary | ICD-10-CM | POA: Diagnosis not present

## 2022-09-21 DIAGNOSIS — J029 Acute pharyngitis, unspecified: Secondary | ICD-10-CM | POA: Diagnosis not present

## 2022-09-25 ENCOUNTER — Inpatient Hospital Stay (HOSPITAL_COMMUNITY)
Admission: AD | Admit: 2022-09-25 | Discharge: 2022-09-25 | Disposition: A | Discharge status: Home / Self Care | Payer: Medicaid Other | Attending: Obstetrics & Gynecology | Admitting: Obstetrics & Gynecology

## 2022-09-25 ENCOUNTER — Encounter (HOSPITAL_COMMUNITY): Payer: Self-pay | Admitting: Obstetrics & Gynecology

## 2022-09-25 DIAGNOSIS — O10913 Unspecified pre-existing hypertension complicating pregnancy, third trimester: Secondary | ICD-10-CM | POA: Diagnosis not present

## 2022-09-25 DIAGNOSIS — Z3A36 36 weeks gestation of pregnancy: Secondary | ICD-10-CM | POA: Diagnosis not present

## 2022-09-25 DIAGNOSIS — O99353 Diseases of the nervous system complicating pregnancy, third trimester: Secondary | ICD-10-CM | POA: Insufficient documentation

## 2022-09-25 DIAGNOSIS — O26893 Other specified pregnancy related conditions, third trimester: Secondary | ICD-10-CM | POA: Insufficient documentation

## 2022-09-25 DIAGNOSIS — Z3689 Encounter for other specified antenatal screening: Secondary | ICD-10-CM

## 2022-09-25 DIAGNOSIS — G44209 Tension-type headache, unspecified, not intractable: Secondary | ICD-10-CM | POA: Insufficient documentation

## 2022-09-25 LAB — COMPREHENSIVE METABOLIC PANEL
ALT: 15 U/L (ref 0–44)
AST: 22 U/L (ref 15–41)
Albumin: 2.9 g/dL — ABNORMAL LOW (ref 3.5–5.0)
Alkaline Phosphatase: 72 U/L (ref 38–126)
Anion gap: 9 (ref 5–15)
BUN: 5 mg/dL — ABNORMAL LOW (ref 6–20)
CO2: 22 mmol/L (ref 22–32)
Calcium: 9.2 mg/dL (ref 8.9–10.3)
Chloride: 103 mmol/L (ref 98–111)
Creatinine, Ser: 0.57 mg/dL (ref 0.44–1.00)
GFR, Estimated: >60 mL/min (ref >60)
Glucose, Bld: 99 mg/dL (ref 70–99)
Potassium: 3.6 mmol/L (ref 3.5–5.1)
Sodium: 134 mmol/L — ABNORMAL LOW (ref 135–145)
Total Bilirubin: 0.4 mg/dL (ref 0.3–1.2)
Total Protein: 5.9 g/dL — ABNORMAL LOW (ref 6.5–8.1)

## 2022-09-25 LAB — URINALYSIS, ROUTINE W REFLEX MICROSCOPIC
Bilirubin Urine: NEGATIVE
Glucose, UA: NEGATIVE mg/dL
Hgb urine dipstick: NEGATIVE
Ketones, ur: NEGATIVE mg/dL
Nitrite: NEGATIVE
Protein, ur: NEGATIVE mg/dL
Specific Gravity, Urine: 1.009 (ref 1.005–1.030)
pH: 6 (ref 5.0–8.0)

## 2022-09-25 LAB — CBC
HCT: 29.8 % — ABNORMAL LOW (ref 36.0–46.0)
Hemoglobin: 10.1 g/dL — ABNORMAL LOW (ref 12.0–15.0)
MCH: 31.4 pg (ref 26.0–34.0)
MCHC: 33.9 g/dL (ref 30.0–36.0)
MCV: 92.5 fL (ref 80.0–100.0)
Platelets: 223 10*3/uL (ref 150–400)
RBC: 3.22 MIL/uL — ABNORMAL LOW (ref 3.87–5.11)
RDW: 15.8 % — ABNORMAL HIGH (ref 11.5–15.5)
WBC: 12.1 10*3/uL — ABNORMAL HIGH (ref 4.0–10.5)
nRBC: 0 % (ref 0.0–0.2)

## 2022-09-25 LAB — PROTEIN / CREATININE RATIO, URINE
Creatinine, Urine: 55 mg/dL
Protein Creatinine Ratio: 0.11 mg/mg{Cre} (ref 0.00–0.15)
Total Protein, Urine: 6 mg/dL

## 2022-09-25 MED ORDER — ACETAMINOPHEN-CAFFEINE 500-65 MG PO TABS
2.0000 | ORAL_TABLET | Freq: Once | ORAL | Status: AC
  Administered 2022-09-25: 2 via ORAL
  Filled 2022-09-25: qty 2

## 2022-09-25 NOTE — MAU Note (Addendum)
Pt says she has blurry vision / dark spots- started Saturday So today took her BP at home -121/100, 119/95, 139/100 In office- BP has been good.  Had high BP with last baby - end of preg - then was induced  Feels UC's

## 2022-09-25 NOTE — MAU Provider Note (Signed)
History     CSN: [id number]  Arrival date and time: 09/25/22 2036   Event Date/Time   First Provider Initiated Contact with Patient 09/25/22 2146      Chief Complaint  Patient presents with   Hypertension   27 y.o. G2P1001 @36 .5 weeks presenting elevated blood pressure at home. Reports she was feeling off so she checked her BP and was 121/100 and 139/100. Reports intermittent spots, blurry vision, and occipital HA over the last few days. She has not treated the HA because it went away after she took a nap. She reports intermittent lower abdominal cramping and BH ctx. Denies spotting or VB. Reports good FM.     OB History     Gravida  2   Para  1   Term  1   Preterm      AB      Living  1      SAB      IAB      Ectopic      Multiple      Live Births  1           Past Medical History:  Diagnosis Date   Anxiety    Left ovarian cyst     Past Surgical History:  Procedure Laterality Date   TONSILLECTOMY AND ADENOIDECTOMY      Family History  Problem Relation Age of Onset   Asthma Father    Hypertension Father    Heart disease Paternal Grandmother    Cancer Paternal Grandmother    Diabetes Paternal Grandfather     Social History   Tobacco Use   Smoking status: Never   Smokeless tobacco: Never  Vaping Use   Vaping Use: Never used  Substance Use Topics   Alcohol use: No   Drug use: No    Allergies:  Allergies  Allergen Reactions   Codeine Other (See Comments)    hallucinations  hallucinations   hallucinations   hallucinations    Medications Prior to Admission  Medication Sig Dispense Refill Last Dose   aspirin EC 81 MG tablet Take 1 tablet (81 mg total) by mouth daily. Take after 12 weeks for prevention of preeclampsia later in pregnancy 300 tablet 2 09/24/2022   Cyanocobalamin (VITAMIN B-12 IJ) Inject as directed.   Past Month   famotidine (PEPCID) 20 MG tablet Take 20 mg by mouth 2 (two) times daily.   09/24/2022   Ferrous  Sulfate (IRON PO) Take 1 tablet by mouth every morning.   09/25/2022   Prenatal MV-Min-Fe Fum-FA-DHA (VITAFOL-OB+DHA) 65-1 & 250 MG MISC Take 1 capsule by mouth daily in the afternoon.   09/24/2022   amoxicillin (AMOXIL) 500 MG capsule Take 500 mg by mouth 2 (two) times daily. (Patient not taking: Reported on 09/14/2022)      pantoprazole (PROTONIX) 40 MG tablet Take 1 tablet (40 mg total) by mouth at bedtime. 30 tablet 1 More than a month    Review of Systems  Eyes:  Positive for visual disturbance.  Gastrointestinal:  Positive for abdominal pain.  Genitourinary:  Negative for dysuria, vaginal bleeding and vaginal discharge.  Neurological:  Positive for headaches.   Physical Exam   Blood pressure 126/81, pulse 85, temperature 98 F (36.7 C), temperature source Oral, resp. rate 16, height 5\' 2"  (1.575 m), weight 81.1 kg, last menstrual period 01/11/2022, SpO2 99 %. Patient Vitals for the past 24 hrs:  BP Temp Temp src Pulse Resp SpO2 Height Weight  09/25/22 2325 126/81 -- --  85 16 99 % -- --  09/25/22 2200 117/75 -- -- (!) 107 -- 98 % -- --  09/25/22 2145 121/82 -- -- 96 -- 98 % -- --  09/25/22 2130 124/85 -- -- (!) 109 -- 99 % -- --  09/25/22 2120 126/85 98 F (36.7 C) Oral (!) 118 12 --  (1.575 m) 81.1 kg   Physical Exam Vitals and nursing note reviewed. Exam conducted with a chaperone present.  Constitutional:      General: She is not in acute distress.    Appearance: Normal appearance.  HENT:     Head: Normocephalic and atraumatic.  Cardiovascular:     Rate and Rhythm: Normal rate.  Pulmonary:     Effort: Pulmonary effort is normal. No respiratory distress.  Genitourinary:    Comments: VE: FT/thick Musculoskeletal:        General: Normal range of motion.     Cervical back: Normal range of motion.  Skin:    General: Skin is warm and dry.  Neurological:     General: No focal deficit present.     Mental Status: She is alert and oriented to person, place, and time.   Psychiatric:        Mood and Affect: Mood normal.        Behavior: Behavior normal.   EFM: 130 bpm, mod variability, + accels, no decels Toco: rare  Results for orders placed or performed during the hospital encounter of 09/25/22 (from the past 24 hour(s))  Urinalysis, Routine w reflex microscopic -Urine, Clean Catch     Status: Abnormal   Collection Time: 09/25/22  8:55 PM  Result Value Ref Range   Color, Urine YELLOW YELLOW   APPearance CLEAR CLEAR   Specific Gravity, Urine 1.009 1.005 - 1.030   pH 6.0 5.0 - 8.0   Glucose, UA NEGATIVE NEGATIVE mg/dL   Hgb urine dipstick NEGATIVE NEGATIVE   Bilirubin Urine NEGATIVE NEGATIVE   Ketones, ur NEGATIVE NEGATIVE mg/dL   Protein, ur NEGATIVE NEGATIVE mg/dL   Nitrite NEGATIVE NEGATIVE   Leukocytes,Ua TRACE (A) NEGATIVE   RBC / HPF 0-5 0 - 5 RBC/hpf   WBC, UA 0-5 0 - 5 WBC/hpf   Bacteria, UA RARE (A) NONE SEEN   Squamous Epithelial / HPF 0-5 0 - 5 /HPF   Mucus PRESENT   Protein / creatinine ratio, urine     Status: None   Collection Time: 09/25/22  8:55 PM  Result Value Ref Range   Creatinine, Urine 55 mg/dL   Total Protein, Urine 6 mg/dL   Protein Creatinine Ratio 0.11 0.00 - 0.15 mg/mg[Cre]  CBC     Status: Abnormal   Collection Time: 09/25/22 10:19 PM  Result Value Ref Range   WBC 12.1 (H) 4.0 - 10.5 K/uL   RBC 3.22 (L) 3.87 - 5.11 MIL/uL   Hemoglobin 10.1 (L) 12.0 - 15.0 g/dL   HCT 40.9 (L) 81.1 - 91.4 %   MCV 92.5 80.0 - 100.0 fL   MCH 31.4 26.0 - 34.0 pg   MCHC 33.9 30.0 - 36.0 g/dL   RDW 78.2 (H) 95.6 - 21.3 %   Platelets 223 150 - 400 K/uL   nRBC 0.0 0.0 - 0.2 %  Comprehensive metabolic panel     Status: Abnormal   Collection Time: 09/25/22 10:19 PM  Result Value Ref Range   Sodium 134 (L) 135 - 145 mmol/L   Potassium 3.6 3.5 - 5.1 mmol/L   Chloride 103 98 - 111  mmol/L   CO2 22 22 - 32 mmol/L   Glucose, Bld 99 70 - 99 mg/dL   BUN 5 (L) 6 - 20 mg/dL   Creatinine, Ser 1.61 0.44 - 1.00 mg/dL   Calcium 9.2 8.9 -  09.6 mg/dL   Total Protein 5.9 (L) 6.5 - 8.1 g/dL   Albumin 2.9 (L) 3.5 - 5.0 g/dL   AST 22 15 - 41 U/L   ALT 15 0 - 44 U/L   Alkaline Phosphatase 72 38 - 126 U/L   Total Bilirubin 0.4 0.3 - 1.2 mg/dL   GFR, Estimated >04 >54 mL/min   Anion gap 9 5 - 15    MAU Course  Procedures Excedrin  MDM Her pregnancy is complicated by hx of PEC, anemia, rubella non-immune. Labs ordered and reviewed. No evidence of PEC. HA resolved. Stable for discharge home.   Assessment and Plan   1. [redacted] weeks gestation of pregnancy   2. NST (non-stress test) reactive   3. Acute non intractable tension-type headache    Discharge home [facility] up at [facility] as scheduled PEC precautions  Allergies as of 09/25/2022       Reactions   Codeine Other (See Comments)   hallucinations hallucinations  hallucinations   hallucinations        Medication List     STOP taking these medications    amoxicillin 500 MG capsule Commonly known as: AMOXIL       TAKE these medications    aspirin EC 81 MG tablet Take 1 tablet (81 mg total) by mouth daily. Take after 12 weeks for prevention of preeclampsia later in pregnancy   famotidine 20 MG tablet Commonly known as: PEPCID Take 20 mg by mouth 2 (two) times daily.   IRON PO Take 1 tablet by mouth every morning.   pantoprazole 40 MG tablet Commonly known as: Protonix Take 1 tablet (40 mg total) by mouth at bedtime.   Vitafol-OB+DHA 65-1 & 250 MG Misc Take 1 capsule by mouth daily in the afternoon.   VITAMIN B-12 IJ Inject as directed.         Donette Larry, CNM 09/25/2022, 11:38 PM

## 2022-09-26 ENCOUNTER — Encounter: Payer: Medicaid Other | Admitting: Student

## 2022-09-27 ENCOUNTER — Ambulatory Visit (INDEPENDENT_AMBULATORY_CARE_PROVIDER_SITE_OTHER): Payer: Medicaid Other | Admitting: Family Medicine

## 2022-09-27 ENCOUNTER — Other Ambulatory Visit (HOSPITAL_COMMUNITY)
Admission: RE | Admit: 2022-09-27 | Discharge: 2022-09-27 | Disposition: A | Discharge status: Home / Self Care | Payer: Medicaid Other | Source: Ambulatory Visit | Attending: Family Medicine | Admitting: Family Medicine

## 2022-09-27 VITALS — BP 129/69 | HR 110 | Wt 179.0 lb

## 2022-09-27 DIAGNOSIS — O09899 Supervision of other high risk pregnancies, unspecified trimester: Secondary | ICD-10-CM

## 2022-09-27 DIAGNOSIS — Z3480 Encounter for supervision of other normal pregnancy, unspecified trimester: Secondary | ICD-10-CM | POA: Diagnosis not present

## 2022-09-27 DIAGNOSIS — Z8759 Personal history of other complications of pregnancy, childbirth and the puerperium: Secondary | ICD-10-CM

## 2022-09-27 DIAGNOSIS — Z2839 Other underimmunization status: Secondary | ICD-10-CM

## 2022-09-27 NOTE — Progress Notes (Signed)
   PRENATAL VISIT NOTE  Subjective:  Tricia Allen is a 27 y.o. G2P1001 at [redacted]w[redacted]d being seen today for ongoing prenatal care.  She is currently monitored for the following issues for this low-risk pregnancy and has Prior pregnancy with Fetal renal anomaly; Supervision of other normal pregnancy, antepartum; History of pre-eclampsia; Rubella non-immune status, antepartum; Chronic migraine with aura without status migrainosus, not intractable; Near syncope; Anxiety; Anemia; and Congenital thyroid hemiagenesis on their problem list.  Patient reports headache.  Contractions: Irregular ("I guess braxton hicks"). Vag. Bleeding: None.  Movement: Present. Denies leaking of fluid.   The following portions of the patient's history were reviewed and updated as appropriate: allergies, current medications, past family history, past medical history, past social history, past surgical history and problem list.   Objective:   Vitals:   09/27/22 0836  BP: 129/69  Pulse: (!) 110  Weight: 179 lb (81.2 kg)    Fetal Status:     Movement: Present     General:  Alert, oriented and cooperative. Patient is in no acute distress.  Skin: Skin is warm and dry. No rash noted.   Cardiovascular: Normal heart rate noted  Respiratory: Normal respiratory effort, no problems with respiration noted  Abdomen: Soft, gravid, appropriate for gestational age.  Pain/Pressure: Absent     Pelvic: Cervical exam deferred        Extremities: Normal range of motion.  Edema: None  Mental Status: Normal mood and affect. Normal behavior. Normal judgment and thought content.   Assessment and Plan:  Pregnancy: G2P1001 at [redacted]w[redacted]d 1. Supervision of other normal pregnancy, antepartum GBS and GC/CT today.  2. History of pre-eclampsia BP normal  3. Rubella non-immune status, antepartum MMR post delivery  Term labor symptoms and general obstetric precautions including but not limited to vaginal bleeding, contractions, leaking of fluid  and fetal movement were reviewed in detail with the patient. Please refer to After Visit Summary for other counseling recommendations.   No follow-ups on file.  Future Appointments  Date Time Provider Department Center  10/05/2022  8:55 AM Constant, Gigi Gin, MD [facility] None  10/12/2022 11:15 AM Reva Bores, MD [facility] None  12/14/2022  8:20 AM Servando Salina, Lavona Mound, DO [facility] None    Levie Heritage, DO

## 2022-09-28 LAB — GC/CHLAMYDIA PROBE AMP (~~LOC~~) NOT AT ARMC
Chlamydia: NEGATIVE
Comment: NEGATIVE
Comment: NORMAL
Neisseria Gonorrhea: NEGATIVE

## 2022-09-30 LAB — CULTURE, BETA STREP (GROUP B ONLY): Strep Gp B Culture: POSITIVE — AB

## 2022-10-02 ENCOUNTER — Inpatient Hospital Stay (HOSPITAL_COMMUNITY)
Admission: AD | Admit: 2022-10-02 | Discharge: 2022-10-03 | Disposition: A | Discharge status: Home / Self Care | Payer: Medicaid Other | Attending: Family Medicine | Admitting: Family Medicine

## 2022-10-02 ENCOUNTER — Encounter: Payer: Self-pay | Admitting: Family Medicine

## 2022-10-02 ENCOUNTER — Encounter (HOSPITAL_COMMUNITY): Payer: Self-pay | Admitting: Family Medicine

## 2022-10-02 DIAGNOSIS — B9689 Other specified bacterial agents as the cause of diseases classified elsewhere: Secondary | ICD-10-CM | POA: Insufficient documentation

## 2022-10-02 DIAGNOSIS — O26893 Other specified pregnancy related conditions, third trimester: Secondary | ICD-10-CM | POA: Diagnosis not present

## 2022-10-02 DIAGNOSIS — Z8759 Personal history of other complications of pregnancy, childbirth and the puerperium: Secondary | ICD-10-CM

## 2022-10-02 DIAGNOSIS — M7989 Other specified soft tissue disorders: Secondary | ICD-10-CM | POA: Insufficient documentation

## 2022-10-02 DIAGNOSIS — O99820 Streptococcus B carrier state complicating pregnancy: Secondary | ICD-10-CM | POA: Insufficient documentation

## 2022-10-02 DIAGNOSIS — R519 Headache, unspecified: Secondary | ICD-10-CM | POA: Diagnosis not present

## 2022-10-02 DIAGNOSIS — O98813 Other maternal infectious and parasitic diseases complicating pregnancy, third trimester: Secondary | ICD-10-CM | POA: Insufficient documentation

## 2022-10-02 DIAGNOSIS — Z3A37 37 weeks gestation of pregnancy: Secondary | ICD-10-CM | POA: Diagnosis not present

## 2022-10-02 DIAGNOSIS — B3731 Acute candidiasis of vulva and vagina: Secondary | ICD-10-CM | POA: Diagnosis not present

## 2022-10-02 DIAGNOSIS — O23593 Infection of other part of genital tract in pregnancy, third trimester: Secondary | ICD-10-CM | POA: Diagnosis not present

## 2022-10-02 DIAGNOSIS — R030 Elevated blood-pressure reading, without diagnosis of hypertension: Secondary | ICD-10-CM | POA: Diagnosis not present

## 2022-10-02 DIAGNOSIS — Z3480 Encounter for supervision of other normal pregnancy, unspecified trimester: Secondary | ICD-10-CM

## 2022-10-02 DIAGNOSIS — O09899 Supervision of other high risk pregnancies, unspecified trimester: Secondary | ICD-10-CM

## 2022-10-02 DIAGNOSIS — O169 Unspecified maternal hypertension, unspecified trimester: Secondary | ICD-10-CM

## 2022-10-02 DIAGNOSIS — O163 Unspecified maternal hypertension, third trimester: Secondary | ICD-10-CM | POA: Diagnosis not present

## 2022-10-02 LAB — WET PREP, GENITAL
Clue Cells Wet Prep HPF POC: NONE SEEN
Sperm: NONE SEEN
Trich, Wet Prep: NONE SEEN
WBC, Wet Prep HPF POC: >=10 — AB (ref <10)

## 2022-10-02 LAB — AMNISURE RUPTURE OF MEMBRANE (ROM) NOT AT ARMC: Amnisure ROM: NEGATIVE

## 2022-10-02 MED ORDER — ACETAMINOPHEN-CAFFEINE 500-65 MG PO TABS
2.0000 | ORAL_TABLET | Freq: Once | ORAL | Status: AC
  Administered 2022-10-02: 2 via ORAL
  Filled 2022-10-02: qty 2

## 2022-10-02 MED ORDER — METOCLOPRAMIDE HCL 10 MG PO TABS
10.0000 mg | ORAL_TABLET | Freq: Once | ORAL | Status: AC
  Administered 2022-10-02: 10 mg via ORAL
  Filled 2022-10-02: qty 1

## 2022-10-02 NOTE — MAU Note (Signed)
Pt says BP at home was 121/99- at 830pm H/A started 7pm-  no meds  7/10 Says on Sun at 0200- thinks had fluid come out - since then underwear slight wet - not D/C , Not sweat . Today at 830am - had pain radiate down right thigh-  Has not called Dr about this - Dr Adrian Blackwater

## 2022-10-02 NOTE — MAU Provider Note (Signed)
History     CSN: [id number]  Arrival date and time: 10/02/22 2126   Event Date/Time   First Provider Initiated Contact with Patient 10/02/22 2235      No chief complaint on file.  HPI  Tricia Allen is a 27 y.o. G2P1001 at [redacted]w[redacted]d who presents for evaluation of multiple complaints. Patient reports she has a "migraine like" headache. Patient rates the pain as a 7/10 and has not tried anything for the pain. She denies any visual changes or epigastric pain. She reports some swelling in her feet. She reports she checked her BP at home and it was elevated in the 120s/90s.   She also reports feeling "wet" since Sunday at 0200. She is unsure if it is her water but feels like it is more than normal discharge. She denies any vaginal bleeding. Denies any constipation, diarrhea or any urinary complaints. Reports normal fetal movement.   OB History     Gravida  2   Para  1   Term  1   Preterm      AB      Living  1      SAB      IAB      Ectopic      Multiple      Live Births  1           Past Medical History:  Diagnosis Date   Anxiety    Left ovarian cyst     Past Surgical History:  Procedure Laterality Date   TONSILLECTOMY AND ADENOIDECTOMY      Family History  Problem Relation Age of Onset   Asthma Father    Hypertension Father    Heart disease Paternal Grandmother    Cancer Paternal Grandmother    Diabetes Paternal Grandfather     Social History   Tobacco Use   Smoking status: Never   Smokeless tobacco: Never  Vaping Use   Vaping Use: Never used  Substance Use Topics   Alcohol use: No   Drug use: No    Allergies:  Allergies  Allergen Reactions   Codeine Other (See Comments)    hallucinations  hallucinations   hallucinations   hallucinations    Medications Prior to Admission  Medication Sig Dispense Refill Last Dose   aspirin EC 81 MG tablet Take 1 tablet (81 mg total) by mouth daily. Take after 12 weeks for prevention of  preeclampsia later in pregnancy 300 tablet 2 10/01/2022   Cyanocobalamin (VITAMIN B-12 IJ) Inject as directed.   Past Month   famotidine (PEPCID) 20 MG tablet Take 20 mg by mouth 2 (two) times daily.   10/01/2022   Ferrous Sulfate (IRON PO) Take 1 tablet by mouth every morning.   10/02/2022   Prenatal MV-Min-Fe Fum-FA-DHA (VITAFOL-OB+DHA) 65-1 & 250 MG MISC Take 1 capsule by mouth daily in the afternoon.   10/01/2022   pantoprazole (PROTONIX) 40 MG tablet Take 1 tablet (40 mg total) by mouth at bedtime. 30 tablet 1     Review of Systems  Constitutional: Negative.  Negative for fatigue and fever.  HENT: Negative.    Respiratory: Negative.  Negative for shortness of breath.   Cardiovascular: Negative.  Negative for chest pain.  Gastrointestinal:  Positive for abdominal pain. Negative for constipation, diarrhea, nausea and vomiting.  Genitourinary:  Positive for vaginal discharge. Negative for dysuria and vaginal bleeding.  Neurological:  Positive for headaches. Negative for dizziness.   Physical Exam   Blood pressure (!) 105/59,  pulse 87, temperature 97.9 F (36.6 C), temperature source Oral, resp. rate 16, height 5\' 2"  (1.575 m), weight 81.5 kg, last menstrual period 01/11/2022, SpO2 99 %.  Patient Vitals for the past 24 hrs:  BP Temp Temp src Pulse Resp SpO2 Height Weight  10/03/22 0101 (!) 105/59 -- -- 87 -- 99 % -- --  10/03/22 0049 (!) 106/57 -- -- 87 -- -- -- --  10/03/22 0001 122/86 -- -- 95 -- 100 % -- --  10/02/22 2346 135/81 -- -- 87 -- 100 % -- --  10/02/22 2331 131/87 -- -- 86 -- 99 % -- --  10/02/22 2323 (!) 148/89 -- -- (!) 105 -- -- -- --  10/02/22 2301 118/81 -- -- 93 -- 100 % -- --  10/02/22 2256 -- -- -- -- -- 99 % -- --  10/02/22 2251 -- -- -- -- -- 100 % -- --  10/02/22 2246 115/79 -- -- 96 -- 99 % -- --  10/02/22 2241 -- -- -- -- -- 100 % -- --  10/02/22 2236 -- -- -- -- -- 99 % -- --  10/02/22 2231 122/83 -- -- (!) 103 -- 99 % -- --  10/02/22 2226 -- -- -- -- --  98 % -- --  10/02/22 2221 -- -- -- -- -- 99 % -- --  10/02/22 2216 118/77 -- -- 99 -- 99 % -- --  10/02/22 2211 -- -- -- -- -- 99 % -- --  10/02/22 2210 118/78 -- -- (!) 106 -- -- -- --  10/02/22 2152 130/81 97.9 F (36.6 C) Oral (!) 103 16 -- 5\' 2"  (1.575 m) 81.5 kg    Physical Exam Vitals and nursing note reviewed.  Constitutional:      General: She is not in acute distress.    Appearance: She is well-developed.  HENT:     Head: Normocephalic.  Eyes:     Pupils: Pupils are equal, round, and reactive to light.  Cardiovascular:     Rate and Rhythm: Normal rate and regular rhythm.     Heart sounds: Normal heart sounds.  Pulmonary:     Effort: Pulmonary effort is normal. No respiratory distress.     Breath sounds: Normal breath sounds.  Abdominal:     General: Bowel sounds are normal. There is no distension.     Palpations: Abdomen is soft.     Tenderness: There is no abdominal tenderness.  Skin:    General: Skin is warm and dry.  Neurological:     Mental Status: She is alert and oriented to person, place, and time.  Psychiatric:        Mood and Affect: Mood normal.        Behavior: Behavior normal.        Thought Content: Thought content normal.        Judgment: Judgment normal.     Fetal Tracing:  Baseline: 140  Variability: moderate  Accels: 15x15 Decels: none   Toco: none  Dilation: 1.5 Effacement (%): 50 Cervical Position: Middle Station: -3 Presentation: Vertex Exam by:: weston,rn   MAU Course  Procedures  Results for orders placed or performed during the hospital encounter of 10/02/22 (from the past 24 hour(s))  Amnisure rupture of membrane (rom)not at [facility]     Status: None   Collection Time: 10/02/22 11:01 PM  Result Value Ref Range   Amnisure ROM NEGATIVE   Wet prep, genital     Status: Abnormal   Collection Time:  10/02/22 11:01 PM   Specimen: Vaginal  Result Value Ref Range   Yeast Wet Prep HPF POC PRESENT (A) NONE SEEN   Trich, Wet Prep  NONE SEEN NONE SEEN   Clue Cells Wet Prep HPF POC NONE SEEN NONE SEEN   WBC, Wet Prep HPF POC >=10 (A) <10   Sperm NONE SEEN   Protein / creatinine ratio, urine     Status: Abnormal   Collection Time: 10/02/22 11:48 PM  Result Value Ref Range   Creatinine, Urine 48 mg/dL   Total Protein, Urine 8 mg/dL   Protein Creatinine Ratio 0.17 (H) 0.00 - 0.15 mg/mg[Cre]  CBC     Status: Abnormal   Collection Time: 10/03/22 12:36 AM  Result Value Ref Range   WBC 12.1 (H) 4.0 - 10.5 K/uL   RBC 3.23 (L) 3.87 - 5.11 MIL/uL   Hemoglobin 10.2 (L) 12.0 - 15.0 g/dL   HCT 40.9 (L) 81.1 - 91.4 %   MCV 93.5 80.0 - 100.0 fL   MCH 31.6 26.0 - 34.0 pg   MCHC 33.8 30.0 - 36.0 g/dL   RDW 78.2 (H) 95.6 - 21.3 %   Platelets 235 150 - 400 K/uL   nRBC 0.0 0.0 - 0.2 %  Comprehensive metabolic panel     Status: Abnormal   Collection Time: 10/03/22 12:36 AM  Result Value Ref Range   Sodium 133 (L) 135 - 145 mmol/L   Potassium 3.6 3.5 - 5.1 mmol/L   Chloride 104 98 - 111 mmol/L   CO2 21 (L) 22 - 32 mmol/L   Glucose, Bld 95 70 - 99 mg/dL   BUN <5 (L) 6 - 20 mg/dL   Creatinine, Ser 0.86 0.44 - 1.00 mg/dL   Calcium 9.0 8.9 - 57.8 mg/dL   Total Protein 5.9 (L) 6.5 - 8.1 g/dL   Albumin 2.9 (L) 3.5 - 5.0 g/dL   AST 22 15 - 41 U/L   ALT 14 0 - 44 U/L   Alkaline Phosphatase 85 38 - 126 U/L   Total Bilirubin 0.5 0.3 - 1.2 mg/dL   GFR, Estimated >46 >96 mL/min   Anion gap 8 5 - 15     MDM Labs ordered and reviewed.   UA CBC, CMP, Protein/creat ratio Excedrin Tension/Reglan- minimal improvement of HA Flexeril PO  Discussed speculum exam vs amnisure for rule out rupture. Patient desires amnisure  Amnisure Wet prep and gc/chlamydia  One elevated blood pressure while in MAU. No other episodes of HTN in the pregnancy. Preeclampsia precautions reviewed at length and reviewed that if it remains elevated on 5/2, IOL will be recommended.   Assessment and Plan   1. Candidiasis of vagina during pregnancy   2.  Pregnancy headache in third trimester   3. [redacted] weeks gestation of pregnancy   4. Elevated blood pressure affecting pregnancy, antepartum     -Discharge home in stable condition -Preeclampsia precautions discussed -Patient advised to follow-up with OB as scheduled for prenatal care on 5/2 for BP check -Patient may return to MAU as needed or if her condition were to change or worsen  Rolm Bookbinder, CNM 10/03/2022, 10:35 PM

## 2022-10-03 ENCOUNTER — Encounter: Payer: Self-pay | Admitting: Family Medicine

## 2022-10-03 DIAGNOSIS — R519 Headache, unspecified: Secondary | ICD-10-CM

## 2022-10-03 DIAGNOSIS — O98813 Other maternal infectious and parasitic diseases complicating pregnancy, third trimester: Secondary | ICD-10-CM | POA: Diagnosis not present

## 2022-10-03 DIAGNOSIS — Z3A37 37 weeks gestation of pregnancy: Secondary | ICD-10-CM

## 2022-10-03 DIAGNOSIS — O163 Unspecified maternal hypertension, third trimester: Secondary | ICD-10-CM | POA: Diagnosis not present

## 2022-10-03 DIAGNOSIS — B3731 Acute candidiasis of vulva and vagina: Secondary | ICD-10-CM

## 2022-10-03 DIAGNOSIS — O26893 Other specified pregnancy related conditions, third trimester: Secondary | ICD-10-CM

## 2022-10-03 LAB — CBC
HCT: 30.2 % — ABNORMAL LOW (ref 36.0–46.0)
Hemoglobin: 10.2 g/dL — ABNORMAL LOW (ref 12.0–15.0)
MCH: 31.6 pg (ref 26.0–34.0)
MCHC: 33.8 g/dL (ref 30.0–36.0)
MCV: 93.5 fL (ref 80.0–100.0)
Platelets: 235 10*3/uL (ref 150–400)
RBC: 3.23 MIL/uL — ABNORMAL LOW (ref 3.87–5.11)
RDW: 15.9 % — ABNORMAL HIGH (ref 11.5–15.5)
WBC: 12.1 10*3/uL — ABNORMAL HIGH (ref 4.0–10.5)
nRBC: 0 % (ref 0.0–0.2)

## 2022-10-03 LAB — COMPREHENSIVE METABOLIC PANEL
ALT: 14 U/L (ref 0–44)
AST: 22 U/L (ref 15–41)
Albumin: 2.9 g/dL — ABNORMAL LOW (ref 3.5–5.0)
Alkaline Phosphatase: 85 U/L (ref 38–126)
Anion gap: 8 (ref 5–15)
BUN: <5 mg/dL — ABNORMAL LOW (ref 6–20)
CO2: 21 mmol/L — ABNORMAL LOW (ref 22–32)
Calcium: 9 mg/dL (ref 8.9–10.3)
Chloride: 104 mmol/L (ref 98–111)
Creatinine, Ser: 0.54 mg/dL (ref 0.44–1.00)
GFR, Estimated: >60 mL/min (ref >60)
Glucose, Bld: 95 mg/dL (ref 70–99)
Potassium: 3.6 mmol/L (ref 3.5–5.1)
Sodium: 133 mmol/L — ABNORMAL LOW (ref 135–145)
Total Bilirubin: 0.5 mg/dL (ref 0.3–1.2)
Total Protein: 5.9 g/dL — ABNORMAL LOW (ref 6.5–8.1)

## 2022-10-03 LAB — PROTEIN / CREATININE RATIO, URINE
Creatinine, Urine: 48 mg/dL
Protein Creatinine Ratio: 0.17 mg/mg{Cre} — ABNORMAL HIGH (ref 0.00–0.15)
Total Protein, Urine: 8 mg/dL

## 2022-10-03 MED ORDER — CYCLOBENZAPRINE HCL 5 MG PO TABS
10.0000 mg | ORAL_TABLET | Freq: Once | ORAL | Status: AC
  Administered 2022-10-03: 10 mg via ORAL
  Filled 2022-10-03: qty 2

## 2022-10-03 NOTE — Discharge Instructions (Signed)

## 2022-10-04 DIAGNOSIS — Z419 Encounter for procedure for purposes other than remedying health state, unspecified: Secondary | ICD-10-CM | POA: Diagnosis not present

## 2022-10-05 ENCOUNTER — Encounter: Payer: Medicaid Other | Admitting: Obstetrics and Gynecology

## 2022-10-05 ENCOUNTER — Ambulatory Visit (INDEPENDENT_AMBULATORY_CARE_PROVIDER_SITE_OTHER): Payer: Medicaid Other | Admitting: Obstetrics and Gynecology

## 2022-10-05 ENCOUNTER — Encounter: Payer: Self-pay | Admitting: Obstetrics and Gynecology

## 2022-10-05 VITALS — BP 124/85 | HR 110 | Wt 177.0 lb

## 2022-10-05 DIAGNOSIS — Z2839 Other underimmunization status: Secondary | ICD-10-CM

## 2022-10-05 DIAGNOSIS — Z8759 Personal history of other complications of pregnancy, childbirth and the puerperium: Secondary | ICD-10-CM

## 2022-10-05 DIAGNOSIS — Z3480 Encounter for supervision of other normal pregnancy, unspecified trimester: Secondary | ICD-10-CM | POA: Diagnosis not present

## 2022-10-05 DIAGNOSIS — O99820 Streptococcus B carrier state complicating pregnancy: Secondary | ICD-10-CM

## 2022-10-05 DIAGNOSIS — O09893 Supervision of other high risk pregnancies, third trimester: Secondary | ICD-10-CM

## 2022-10-05 DIAGNOSIS — Z3A38 38 weeks gestation of pregnancy: Secondary | ICD-10-CM

## 2022-10-05 NOTE — Progress Notes (Signed)
   PRENATAL VISIT NOTE  Subjective:  Tricia Allen is a 27 y.o. G2P1001 at [redacted]w[redacted]d being seen today for ongoing prenatal care.  She is currently monitored for the following issues for this low-risk pregnancy and has Prior pregnancy with Fetal renal anomaly; Supervision of other normal pregnancy, antepartum; History of pre-eclampsia; Rubella non-immune status, antepartum; Chronic migraine with aura without status migrainosus, not intractable; Near syncope; Anxiety; Anemia; Congenital thyroid hemiagenesis; and GBS (group B Streptococcus carrier), +RV culture, currently pregnant on their problem list.  Patient reports no complaints this morning but reports headache last night.  Contractions: Not present. Vag. Bleeding: None.  Movement: Present. Denies leaking of fluid.   The following portions of the patient's history were reviewed and updated as appropriate: allergies, current medications, past family history, past medical history, past social history, past surgical history and problem list.   Objective:   Vitals:   10/05/22 0906  BP: 124/85  Pulse: (!) 110  Weight: 177 lb (80.3 kg)    Fetal Status: Fetal Heart Rate (bpm): 145 Fundal Height: 38 cm Movement: Present     General:  Alert, oriented and cooperative. Patient is in no acute distress.  Skin: Skin is warm and dry. No rash noted.   Cardiovascular: Normal heart rate noted  Respiratory: Normal respiratory effort, no problems with respiration noted  Abdomen: Soft, gravid, appropriate for gestational age.  Pain/Pressure: Present     Pelvic: Cervical exam deferred        Extremities: Normal range of motion.  Edema: None  Mental Status: Normal mood and affect. Normal behavior. Normal judgment and thought content.   Assessment and Plan:  Pregnancy: G2P1001 at [redacted]w[redacted]d 1. Supervision of other normal pregnancy, antepartum Patient is doing well  2. GBS (group B Streptococcus carrier), +RV culture, currently pregnant Prophylaxis in  labor  3. Rubella non-immune status, antepartum Will offer pp  4. History of pre-eclampsia BP remain stable Si/Sx of pre-eclampsia reviewed Labs ordered today Patient reports elevated BP readings at home with diastolic values in the 80's.  Advised patient to present to MAU if HA is not relieved with tylenol or other syptoms develop  Term labor symptoms and general obstetric precautions including but not limited to vaginal bleeding, contractions, leaking of fluid and fetal movement were reviewed in detail with the patient. Please refer to After Visit Summary for other counseling recommendations.   Return in about 1 week (around 10/12/2022) for in person, ROB, Low risk.  Future Appointments  Date Time Provider Department Center  10/12/2022 11:15 AM Reva Bores, MD [facility] None  12/14/2022  8:20 AM Tobb, Lavona Mound, DO [facility] None    Catalina Antigua, MD

## 2022-10-06 ENCOUNTER — Encounter: Payer: Self-pay | Admitting: Family Medicine

## 2022-10-06 LAB — PROTEIN / CREATININE RATIO, URINE
Creatinine, Urine: 54.7 mg/dL
Protein, Ur: 8 mg/dL
Protein/Creat Ratio: 146 mg/g creat (ref 0–200)

## 2022-10-06 LAB — COMPREHENSIVE METABOLIC PANEL
ALT: 16 IU/L (ref 0–32)
AST: 20 IU/L (ref 0–40)
Albumin/Globulin Ratio: 1.7 (ref 1.2–2.2)
Albumin: 4 g/dL (ref 4.0–5.0)
Alkaline Phosphatase: 110 IU/L (ref 44–121)
BUN/Creatinine Ratio: 6 — ABNORMAL LOW (ref 9–23)
BUN: 3 mg/dL — ABNORMAL LOW (ref 6–20)
Bilirubin Total: 0.3 mg/dL (ref 0.0–1.2)
CO2: 17 mmol/L — ABNORMAL LOW (ref 20–29)
Calcium: 9.6 mg/dL (ref 8.7–10.2)
Chloride: 103 mmol/L (ref 96–106)
Creatinine, Ser: 0.5 mg/dL — ABNORMAL LOW (ref 0.57–1.00)
Globulin, Total: 2.3 g/dL (ref 1.5–4.5)
Glucose: 68 mg/dL — ABNORMAL LOW (ref 70–99)
Potassium: 4 mmol/L (ref 3.5–5.2)
Sodium: 138 mmol/L (ref 134–144)
Total Protein: 6.3 g/dL (ref 6.0–8.5)
eGFR: 133 mL/min/{1.73_m2} (ref >59)

## 2022-10-06 LAB — CBC
Hematocrit: 31.8 % — ABNORMAL LOW (ref 34.0–46.6)
Hemoglobin: 10.6 g/dL — ABNORMAL LOW (ref 11.1–15.9)
MCH: 30.7 pg (ref 26.6–33.0)
MCHC: 33.3 g/dL (ref 31.5–35.7)
MCV: 92 fL (ref 79–97)
Platelets: 231 10*3/uL (ref 150–450)
RBC: 3.45 x10E6/uL — ABNORMAL LOW (ref 3.77–5.28)
RDW: 15.2 % (ref 11.7–15.4)
WBC: 8.2 10*3/uL (ref 3.4–10.8)

## 2022-10-09 DIAGNOSIS — J029 Acute pharyngitis, unspecified: Secondary | ICD-10-CM | POA: Diagnosis not present

## 2022-10-11 ENCOUNTER — Encounter (HOSPITAL_COMMUNITY): Payer: Self-pay | Admitting: Obstetrics and Gynecology

## 2022-10-11 ENCOUNTER — Inpatient Hospital Stay (HOSPITAL_COMMUNITY)
Admission: AD | Admit: 2022-10-11 | Discharge: 2022-10-11 | Disposition: A | Discharge status: Home / Self Care | Payer: Medicaid Other | Attending: Obstetrics and Gynecology | Admitting: Obstetrics and Gynecology

## 2022-10-11 ENCOUNTER — Inpatient Hospital Stay (HOSPITAL_COMMUNITY): Payer: Medicaid Other

## 2022-10-11 DIAGNOSIS — O99891 Other specified diseases and conditions complicating pregnancy: Secondary | ICD-10-CM | POA: Diagnosis not present

## 2022-10-11 DIAGNOSIS — O09293 Supervision of pregnancy with other poor reproductive or obstetric history, third trimester: Secondary | ICD-10-CM | POA: Diagnosis not present

## 2022-10-11 DIAGNOSIS — R0789 Other chest pain: Secondary | ICD-10-CM

## 2022-10-11 DIAGNOSIS — R200 Anesthesia of skin: Secondary | ICD-10-CM | POA: Insufficient documentation

## 2022-10-11 DIAGNOSIS — Z8659 Personal history of other mental and behavioral disorders: Secondary | ICD-10-CM | POA: Diagnosis not present

## 2022-10-11 DIAGNOSIS — Z3A39 39 weeks gestation of pregnancy: Secondary | ICD-10-CM

## 2022-10-11 DIAGNOSIS — R079 Chest pain, unspecified: Secondary | ICD-10-CM | POA: Diagnosis not present

## 2022-10-11 DIAGNOSIS — Z7982 Long term (current) use of aspirin: Secondary | ICD-10-CM | POA: Insufficient documentation

## 2022-10-11 DIAGNOSIS — O368130 Decreased fetal movements, third trimester, not applicable or unspecified: Secondary | ICD-10-CM

## 2022-10-11 DIAGNOSIS — R29898 Other symptoms and signs involving the musculoskeletal system: Secondary | ICD-10-CM | POA: Diagnosis not present

## 2022-10-11 NOTE — MAU Provider Note (Signed)
History     [id number]  Arrival date and time: 10/11/22 1432    Chief Complaint  Patient presents with   Chest Pain   Decreased Fetal Movement   Extremity Weakness    Arms, with chest pain     HPI Tricia Allen is a 27 y.o. at [redacted]w[redacted]d with PMHx notable for anxiety, PreE in prior pregnancy, who presents for chest pain and multiple other symptoms as well as decreased fetal movement.   Patient reports that since last night she has been having intermittent chest pain It is sharp, centrally located, and usually lasts a few seconds Happens mostly at rest though has also happened with exertion Was feeling sweaty yesterday during one episode but does not have consistent sweating with the pain Having some general pregnancy shortness of breath but nothing above the normal Reports minor swelling in bilateral legs Legs were more painful last night and this morning but not currently She does not smoke, no recent travel No personal or family hx of blood clots Also gets a feeling like her arms are tired and heavy or possibly a little numb? In bilateral arms She is worried about a blood clot  Also feels like baby is moving less than normal Size of movements is the same, just the frequency is decreased Some cramping and contractions No leaking fluid  No vaginal bleeding today   A/Positive/-- (10/23 1026)  OB History     Gravida  2   Para  1   Term  1   Preterm      AB      Living  1      SAB      IAB      Ectopic      Multiple      Live Births  1           Past Medical History:  Diagnosis Date   Anxiety    Left ovarian cyst     Past Surgical History:  Procedure Laterality Date   TONSILLECTOMY AND ADENOIDECTOMY      Family History  Problem Relation Age of Onset   Healthy Mother    Asthma Father    Hypertension Father    Heart disease Paternal Grandmother    Cancer Paternal Grandmother    Diabetes Paternal Grandfather     Social History    Socioeconomic History   Marital status: Single    Spouse name: Not on file   Number of children: Not on file   Years of education: Not on file   Highest education level: Not on file  Occupational History   Not on file  Tobacco Use   Smoking status: Never   Smokeless tobacco: Never  Vaping Use   Vaping Use: Never used  Substance and Sexual Activity   Alcohol use: No   Drug use: No   Sexual activity: Not Currently    Birth control/protection: None  Other Topics Concern   Not on file  Social History Narrative   Not on file   Social Determinants of Health   Financial Resource Strain: Not on file  Food Insecurity: Not on file  Transportation Needs: Not on file  Physical Activity: Not on file  Stress: Not on file  Social Connections: Not on file  Intimate Partner Violence: Not on file    Allergies  Allergen Reactions   Codeine Other (See Comments)    hallucinations  hallucinations   hallucinations   hallucinations    No current  facility-administered medications on file prior to encounter.   Current Outpatient Medications on File Prior to Encounter  Medication Sig Dispense Refill   Cyanocobalamin (VITAMIN B-12 IJ) Inject as directed.     famotidine (PEPCID) 20 MG tablet Take 20 mg by mouth 2 (two) times daily.     Ferrous Sulfate (IRON PO) Take 1 tablet by mouth every morning.     Prenatal MV-Min-Fe Fum-FA-DHA (VITAFOL-OB+DHA) 65-1 & 250 MG MISC Take 1 capsule by mouth daily in the afternoon.     aspirin EC 81 MG tablet Take 1 tablet (81 mg total) by mouth daily. Take after 12 weeks for prevention of preeclampsia later in pregnancy 300 tablet 2   pantoprazole (PROTONIX) 40 MG tablet Take 1 tablet (40 mg total) by mouth at bedtime. 30 tablet 1     ROS Pertinent positives and negative per HPI, all others reviewed and negative  Physical Exam   BP 130/88 (BP Location: Right Arm)   Pulse (!) 121   Temp 98.1 F (36.7 C) (Oral)   Resp 18   Ht 5\' 2"  (1.575 m)    Wt 81.7 kg   LMP 01/11/2022 (Exact Date)   SpO2 99%   BMI 32.96 kg/m   Patient Vitals for the past 24 hrs:  BP Temp Temp src Pulse Resp SpO2 Height Weight  10/11/22 1600 -- -- -- -- -- 99 % -- --  10/11/22 1512 130/88 -- -- (!) 121 18 98 % -- --  10/11/22 1453 129/81 98.1 F (36.7 C) Oral 83 17 100 % 5\' 2"  (1.575 m) 81.7 kg    Physical Exam Vitals reviewed.  Constitutional:      General: She is not in acute distress.    Appearance: She is well-developed. She is not diaphoretic.  Eyes:     General: No scleral icterus. Cardiovascular:     Rate and Rhythm: Normal rate and regular rhythm.     Heart sounds: Normal heart sounds.  Pulmonary:     Effort: Pulmonary effort is normal. No respiratory distress.     Breath sounds: Normal breath sounds.  Abdominal:     General: There is no distension.     Palpations: Abdomen is soft.     Tenderness: There is no abdominal tenderness. There is no guarding or rebound.  Musculoskeletal:     Right lower leg: No edema.     Left lower leg: No edema.  Skin:    General: Skin is warm and dry.  Neurological:     Mental Status: She is alert.     Motor: No pronator drift.     Coordination: Coordination normal.     Deep Tendon Reflexes: Reflexes normal.     Reflex Scores:      Bicep reflexes are 2+ on the right side and 2+ on the left side.      Brachioradialis reflexes are 2+ on the right side and 2+ on the left side.    Comments: Strength 5/5 and sensation intact to light touch in bilateral upper extremities.      Cervical Exam    Bedside Ultrasound Not performed.  My interpretation: n/a  FHT Baseline: 140 bpm Variability: Good {> 6 bpm) Accelerations: Reactive Decelerations: Absent Uterine activity: irritability Cat: I  Labs No results found for this or any previous visit (from the past 24 hour(s)).  Imaging DG Chest Portable 1 View  Result Date: 10/11/2022 CLINICAL DATA:  Thirty-nine weeks pregnant.  Chest pain. EXAM:  PORTABLE CHEST 1 VIEW  COMPARISON:  09/19/2019 FINDINGS: The heart size and mediastinal contours are within normal limits. Both lungs are clear. The visualized skeletal structures are unremarkable. IMPRESSION: No active disease. Electronically Signed   By: Norva Pavlov M.D.   On: 10/11/2022 16:16    MAU Course  Procedures Lab Orders  No laboratory test(s) ordered today   No orders of the defined types were placed in this encounter.  Imaging Orders         DG Chest Portable 1 View     MDM Moderate (Level 3-4)  Assessment and Plan  #Chest pain #[redacted] weeks gestation of pregnancy Atypical in nature. Low suspicion for PE as patient has HR around 100 (normal at this gestational age), normal oxygenation, and no other risk factors outside of pregnancy. Low suspicion for cardiac given short duration and lack of exertional component. Chest x ray unremarkable. Possible costochondritis vs GAD.  #Arm heaviness/numbness Unclear etiology. Benign CV and neuro exam. Provided reassurance.  #Decreased fetal movement NST reactive and movement normalized while in MAU.  #FWB FHT Cat I NST: Reactive   Dispo: discharged to home in stable condition    Venora Maples, MD/MPH 10/11/22 4:34 PM  Allergies as of 10/11/2022       Reactions   Codeine Other (See Comments)   hallucinations hallucinations  hallucinations   hallucinations        Medication List     TAKE these medications    aspirin EC 81 MG tablet Take 1 tablet (81 mg total) by mouth daily. Take after 12 weeks for prevention of preeclampsia later in pregnancy   famotidine 20 MG tablet Commonly known as: PEPCID Take 20 mg by mouth 2 (two) times daily.   IRON PO Take 1 tablet by mouth every morning.   pantoprazole 40 MG tablet Commonly known as: Protonix Take 1 tablet (40 mg total) by mouth at bedtime.   Vitafol-OB+DHA 65-1 & 250 MG Misc Take 1 capsule by mouth daily in the afternoon.   VITAMIN B-12 IJ Inject as  directed.

## 2022-10-11 NOTE — MAU Note (Signed)
Tricia Allen is a 27 y.o. at [redacted]w[redacted]d here in MAU reporting: since yesterday has been having chest pain off and on, when she has the pain, her arms go numb and feel weak. This morning her leg legs were bright red, last night they were purple, she has had sharp pains in her calf and the back of her thigh before, none now.... comes and goes. Baby has been moving, but not as much. Had some spotting a few days. No leaking.  Onset of complaint: yesterday Pain score: none currently Vitals:   10/11/22 1453  BP: 129/81  Pulse: 83  Resp: 17  Temp: 98.1 F (36.7 C)  SpO2: 100%     FHT:154 Lab orders placed from triage:  urine collected

## 2022-10-12 ENCOUNTER — Other Ambulatory Visit (HOSPITAL_COMMUNITY)
Admission: RE | Admit: 2022-10-12 | Discharge: 2022-10-12 | Disposition: A | Discharge status: Home / Self Care | Payer: Medicaid Other | Source: Ambulatory Visit | Attending: Family Medicine | Admitting: Family Medicine

## 2022-10-12 ENCOUNTER — Ambulatory Visit (INDEPENDENT_AMBULATORY_CARE_PROVIDER_SITE_OTHER): Payer: Medicaid Other | Admitting: Family Medicine

## 2022-10-12 VITALS — BP 123/86 | HR 91 | Wt 178.0 lb

## 2022-10-12 DIAGNOSIS — Z3480 Encounter for supervision of other normal pregnancy, unspecified trimester: Secondary | ICD-10-CM

## 2022-10-12 DIAGNOSIS — O26899 Other specified pregnancy related conditions, unspecified trimester: Secondary | ICD-10-CM | POA: Diagnosis not present

## 2022-10-12 DIAGNOSIS — O26893 Other specified pregnancy related conditions, third trimester: Secondary | ICD-10-CM | POA: Diagnosis not present

## 2022-10-12 DIAGNOSIS — Z3A39 39 weeks gestation of pregnancy: Secondary | ICD-10-CM | POA: Diagnosis not present

## 2022-10-12 DIAGNOSIS — N898 Other specified noninflammatory disorders of vagina: Secondary | ICD-10-CM | POA: Insufficient documentation

## 2022-10-12 DIAGNOSIS — Z2839 Other underimmunization status: Secondary | ICD-10-CM

## 2022-10-12 DIAGNOSIS — R531 Weakness: Secondary | ICD-10-CM | POA: Diagnosis not present

## 2022-10-12 DIAGNOSIS — Z8759 Personal history of other complications of pregnancy, childbirth and the puerperium: Secondary | ICD-10-CM

## 2022-10-12 DIAGNOSIS — O09899 Supervision of other high risk pregnancies, unspecified trimester: Secondary | ICD-10-CM

## 2022-10-12 DIAGNOSIS — O99820 Streptococcus B carrier state complicating pregnancy: Secondary | ICD-10-CM

## 2022-10-12 NOTE — Progress Notes (Signed)
   PRENATAL VISIT NOTE  Subjective:  Tricia Allen is a 27 y.o. G2P1001 at [redacted]w[redacted]d being seen today for ongoing prenatal care.  She is currently monitored for the following issues for this low-risk pregnancy and has Prior pregnancy with Fetal renal anomaly; Supervision of other normal pregnancy, antepartum; History of pre-eclampsia; Rubella non-immune status, antepartum; Chronic migraine with aura without status migrainosus, not intractable; Near syncope; Anxiety; Anemia; Congenital thyroid hemiagenesis; and GBS (group B Streptococcus carrier), +RV culture, currently pregnant on their problem list.  Patient reports no complaints.  Contractions: Irritability. Vag. Bleeding: None.  Movement: Present. Denies leaking of fluid.   The following portions of the patient's history were reviewed and updated as appropriate: allergies, current medications, past family history, past medical history, past social history, past surgical history and problem list.   Objective:   Vitals:   10/12/22 1058 10/12/22 1100  BP: 129/80 123/86  Pulse: (!) 102 91  Weight: 178 lb (80.7 kg)     Fetal Status: Fetal Heart Rate (bpm): 135 Fundal Height: 36 cm Movement: Present     General:  Alert, oriented and cooperative. Patient is in no acute distress.  Skin: Skin is warm and dry. No rash noted.   Cardiovascular: Normal heart rate noted  Respiratory: Normal respiratory effort, no problems with respiration noted  Abdomen: Soft, gravid, appropriate for gestational age.  Pain/Pressure: Present     Pelvic: Cervical exam deferred        Extremities: Normal range of motion.     Mental Status: Normal mood and affect. Normal behavior. Normal judgment and thought content.   Assessment and Plan:  Pregnancy: G2P1001 at [redacted]w[redacted]d 1. Supervision of other normal pregnancy, antepartum Continue routine prenatal care.  2. Rubella non-immune status, antepartum MMR pp  3. History of pre-eclampsia No s/sx's of Pre E  4. GBS  (group B Streptococcus carrier), +RV culture, currently pregnant Will need treatment in labor  5. Vaginal discharge during pregnancy, antepartum Check wet prep and treat accordingly - Cervicovaginal ancillary only( [place])  Term labor symptoms and general obstetric precautions including but not limited to vaginal bleeding, contractions, leaking of fluid and fetal movement were reviewed in detail with the patient. Please refer to After Visit Summary for other counseling recommendations.   Return in about 1 week (around 10/19/2022) for [facility], OB visit and BPP.  Future Appointments  Date Time Provider Department Center  10/19/2022  8:30 AM [facility] NURSE [facility]  10/19/2022  8:45 AM [facility] US5 [facility]  10/19/2022 11:15 AM Levie Heritage, DO [facility] None  10/25/2022  6:30 AM [facility] SCHED ROOM [facility] None  11/16/2022 11:15 AM Levie Heritage, DO [facility] None  12/14/2022  8:20 AM Tobb, Lavona [place], DO [facility] None    Reva Bores, MD

## 2022-10-13 LAB — CERVICOVAGINAL ANCILLARY ONLY
Bacterial Vaginitis (gardnerella): NEGATIVE
Candida Glabrata: NEGATIVE
Candida Vaginitis: POSITIVE — AB
Comment: NEGATIVE
Comment: NEGATIVE
Comment: NEGATIVE

## 2022-10-13 MED ORDER — TERCONAZOLE 0.8 % VA CREA: 1.0000 | TOPICAL_CREAM | Freq: Every day | VAGINAL | 0 refills | Status: DC

## 2022-10-13 NOTE — Addendum Note (Signed)
Addended by: Reva Bores on: 10/13/2022 03:04 PM   Modules accepted: Orders

## 2022-10-16 ENCOUNTER — Inpatient Hospital Stay (HOSPITAL_COMMUNITY)
Admission: AD | Admit: 2022-10-16 | Discharge: 2022-10-18 | DRG: 807 | Disposition: A | Discharge status: Home / Self Care | Payer: Medicaid Other | Attending: Family Medicine | Admitting: Family Medicine

## 2022-10-16 ENCOUNTER — Encounter (HOSPITAL_COMMUNITY): Payer: Self-pay | Admitting: Obstetrics & Gynecology

## 2022-10-16 ENCOUNTER — Encounter: Payer: Self-pay | Admitting: Family Medicine

## 2022-10-16 DIAGNOSIS — Z23 Encounter for immunization: Secondary | ICD-10-CM | POA: Diagnosis not present

## 2022-10-16 DIAGNOSIS — O99820 Streptococcus B carrier state complicating pregnancy: Secondary | ICD-10-CM

## 2022-10-16 DIAGNOSIS — O99824 Streptococcus B carrier state complicating childbirth: Secondary | ICD-10-CM | POA: Diagnosis present

## 2022-10-16 DIAGNOSIS — O09899 Supervision of other high risk pregnancies, unspecified trimester: Secondary | ICD-10-CM

## 2022-10-16 DIAGNOSIS — G43E09 Chronic migraine with aura, not intractable, without status migrainosus: Secondary | ICD-10-CM | POA: Diagnosis not present

## 2022-10-16 DIAGNOSIS — Z3A40 40 weeks gestation of pregnancy: Secondary | ICD-10-CM | POA: Diagnosis not present

## 2022-10-16 DIAGNOSIS — O26893 Other specified pregnancy related conditions, third trimester: Secondary | ICD-10-CM | POA: Diagnosis not present

## 2022-10-16 DIAGNOSIS — O134 Gestational [pregnancy-induced] hypertension without significant proteinuria, complicating childbirth: Principal | ICD-10-CM | POA: Diagnosis present

## 2022-10-16 DIAGNOSIS — O139 Gestational [pregnancy-induced] hypertension without significant proteinuria, unspecified trimester: Secondary | ICD-10-CM | POA: Insufficient documentation

## 2022-10-16 DIAGNOSIS — O480 Post-term pregnancy: Secondary | ICD-10-CM | POA: Diagnosis not present

## 2022-10-16 DIAGNOSIS — Z3480 Encounter for supervision of other normal pregnancy, unspecified trimester: Secondary | ICD-10-CM

## 2022-10-16 DIAGNOSIS — Z3A39 39 weeks gestation of pregnancy: Secondary | ICD-10-CM

## 2022-10-16 DIAGNOSIS — Z2839 Encounter for supervision of normal pregnancy, unspecified, unspecified trimester: Secondary | ICD-10-CM

## 2022-10-16 DIAGNOSIS — O99344 Other mental disorders complicating childbirth: Secondary | ICD-10-CM | POA: Diagnosis not present

## 2022-10-16 DIAGNOSIS — Z8759 Personal history of other complications of pregnancy, childbirth and the puerperium: Secondary | ICD-10-CM

## 2022-10-16 LAB — CBC
HCT: 30.9 % — ABNORMAL LOW (ref 36.0–46.0)
Hemoglobin: 10.1 g/dL — ABNORMAL LOW (ref 12.0–15.0)
MCH: 30.6 pg (ref 26.0–34.0)
MCHC: 32.7 g/dL (ref 30.0–36.0)
MCV: 93.6 fL (ref 80.0–100.0)
Platelets: 275 10*3/uL (ref 150–400)
RBC: 3.3 MIL/uL — ABNORMAL LOW (ref 3.87–5.11)
RDW: 15.2 % (ref 11.5–15.5)
WBC: 11.3 10*3/uL — ABNORMAL HIGH (ref 4.0–10.5)
nRBC: 0 % (ref 0.0–0.2)

## 2022-10-16 LAB — PROTEIN / CREATININE RATIO, URINE
Creatinine, Urine: 91 mg/dL
Protein Creatinine Ratio: 0.11 mg/mg{Cre} (ref 0.00–0.15)
Total Protein, Urine: 10 mg/dL

## 2022-10-16 LAB — COMPREHENSIVE METABOLIC PANEL
ALT: 12 U/L (ref 0–44)
AST: 18 U/L (ref 15–41)
Albumin: 2.9 g/dL — ABNORMAL LOW (ref 3.5–5.0)
Alkaline Phosphatase: 93 U/L (ref 38–126)
Anion gap: 12 (ref 5–15)
BUN: <5 mg/dL — ABNORMAL LOW (ref 6–20)
CO2: 20 mmol/L — ABNORMAL LOW (ref 22–32)
Calcium: 9.1 mg/dL (ref 8.9–10.3)
Chloride: 104 mmol/L (ref 98–111)
Creatinine, Ser: 0.51 mg/dL (ref 0.44–1.00)
GFR, Estimated: >60 mL/min (ref >60)
Glucose, Bld: 86 mg/dL (ref 70–99)
Potassium: 3.6 mmol/L (ref 3.5–5.1)
Sodium: 136 mmol/L (ref 135–145)
Total Bilirubin: 0.4 mg/dL (ref 0.3–1.2)
Total Protein: 5.9 g/dL — ABNORMAL LOW (ref 6.5–8.1)

## 2022-10-16 LAB — TYPE AND SCREEN
ABO/RH(D): A POS
Antibody Screen: NEGATIVE

## 2022-10-16 MED ORDER — FENTANYL CITRATE (PF) 100 MCG/2ML IJ SOLN
50.0000 ug | INTRAMUSCULAR | Status: DC | PRN
  Filled 2022-10-16: qty 2

## 2022-10-16 MED ORDER — LACTATED RINGERS IV SOLN: 500.0000 mL | Freq: Once | INTRAVENOUS | Status: DC

## 2022-10-16 MED ORDER — EPHEDRINE 5 MG/ML INJ: 10.0000 mg | INTRAVENOUS | Status: DC | PRN

## 2022-10-16 MED ORDER — MISOPROSTOL 25 MCG QUARTER TABLET
25.0000 ug | ORAL_TABLET | Freq: Once | ORAL | Status: AC
  Administered 2022-10-16: 25 ug via VAGINAL
  Filled 2022-10-16: qty 1

## 2022-10-16 MED ORDER — OXYCODONE-ACETAMINOPHEN 5-325 MG PO TABS: 1.0000 | ORAL_TABLET | ORAL | Status: DC | PRN

## 2022-10-16 MED ORDER — LABETALOL HCL 5 MG/ML IV SOLN: 80.0000 mg | INTRAVENOUS | Status: DC | PRN

## 2022-10-16 MED ORDER — HYDRALAZINE HCL 20 MG/ML IJ SOLN: 10.0000 mg | INTRAMUSCULAR | Status: DC | PRN

## 2022-10-16 MED ORDER — MISOPROSTOL 50MCG HALF TABLET
50.0000 ug | ORAL_TABLET | Freq: Once | ORAL | Status: AC
  Administered 2022-10-16: 50 ug via ORAL
  Filled 2022-10-16: qty 1

## 2022-10-16 MED ORDER — OXYTOCIN BOLUS FROM INFUSION
333.0000 mL | Freq: Once | INTRAVENOUS | Status: AC
  Administered 2022-10-17: 333 mL via INTRAVENOUS

## 2022-10-16 MED ORDER — PHENYLEPHRINE 80 MCG/ML (10ML) SYRINGE FOR IV PUSH (FOR BLOOD PRESSURE SUPPORT): 80.0000 ug | PREFILLED_SYRINGE | INTRAVENOUS | Status: DC | PRN

## 2022-10-16 MED ORDER — SOD CITRATE-CITRIC ACID 500-334 MG/5ML PO SOLN: 30.0000 mL | ORAL | Status: DC | PRN

## 2022-10-16 MED ORDER — TERBUTALINE SULFATE 1 MG/ML IJ SOLN: 0.2500 mg | Freq: Once | INTRAMUSCULAR | Status: DC | PRN

## 2022-10-16 MED ORDER — ACETAMINOPHEN 325 MG PO TABS: 650.0000 mg | ORAL_TABLET | ORAL | Status: DC | PRN

## 2022-10-16 MED ORDER — LACTATED RINGERS IV SOLN: INTRAVENOUS | Status: DC

## 2022-10-16 MED ORDER — ACETAMINOPHEN 325 MG PO TABS
650.0000 mg | ORAL_TABLET | Freq: Once | ORAL | Status: AC
  Administered 2022-10-16: 650 mg via ORAL
  Filled 2022-10-16: qty 2

## 2022-10-16 MED ORDER — ONDANSETRON HCL 4 MG/2ML IJ SOLN
4.0000 mg | Freq: Four times a day (QID) | INTRAMUSCULAR | Status: DC | PRN
  Administered 2022-10-16: 4 mg via INTRAVENOUS
  Filled 2022-10-16: qty 2

## 2022-10-16 MED ORDER — LABETALOL HCL 5 MG/ML IV SOLN: 40.0000 mg | INTRAVENOUS | Status: DC | PRN

## 2022-10-16 MED ORDER — LACTATED RINGERS IV SOLN: 500.0000 mL | INTRAVENOUS | Status: DC | PRN

## 2022-10-16 MED ORDER — DIPHENHYDRAMINE HCL 50 MG/ML IJ SOLN: 12.5000 mg | INTRAMUSCULAR | Status: DC | PRN

## 2022-10-16 MED ORDER — OXYTOCIN-SODIUM CHLORIDE 30-0.9 UT/500ML-% IV SOLN
2.5000 [IU]/h | INTRAVENOUS | Status: DC
  Administered 2022-10-17: 2.5 [IU]/h via INTRAVENOUS

## 2022-10-16 MED ORDER — PENICILLIN G POT IN DEXTROSE 60000 UNIT/ML IV SOLN
3.0000 10*6.[IU] | INTRAVENOUS | Status: DC
  Administered 2022-10-17 (×2): 3 10*6.[IU] via INTRAVENOUS
  Filled 2022-10-16 (×2): qty 50

## 2022-10-16 MED ORDER — LABETALOL HCL 5 MG/ML IV SOLN: 20.0000 mg | INTRAVENOUS | Status: DC | PRN

## 2022-10-16 MED ORDER — OXYCODONE-ACETAMINOPHEN 5-325 MG PO TABS: 2.0000 | ORAL_TABLET | ORAL | Status: DC | PRN

## 2022-10-16 MED ORDER — SODIUM CHLORIDE 0.9 % IV SOLN
5.0000 10*6.[IU] | Freq: Once | INTRAVENOUS | Status: AC
  Administered 2022-10-16: 5 10*6.[IU] via INTRAVENOUS
  Filled 2022-10-16: qty 5

## 2022-10-16 MED ORDER — LIDOCAINE HCL (PF) 1 % IJ SOLN: 30.0000 mL | INTRAMUSCULAR | Status: DC | PRN

## 2022-10-16 MED ORDER — FLEET ENEMA 7-19 GM/118ML RE ENEM: 1.0000 | ENEMA | RECTAL | Status: DC | PRN

## 2022-10-16 MED ORDER — FENTANYL-BUPIVACAINE-NACL 0.5-0.125-0.9 MG/250ML-% EP SOLN
12.0000 mL/h | EPIDURAL | Status: DC | PRN
  Filled 2022-10-16: qty 250

## 2022-10-16 NOTE — MAU Note (Signed)
Pt is a G2P1 at 39.5 weeks with h/o PreE in last pregnancy.  Today she reports HA, changes in vision, dizziness, fatigue, and general weakness.  She reports good FM, denies any VB or LOF.  She is currently on ASA for previous PreE pregnancy and iron for anemia.    Denies any other OB or medical concerns.

## 2022-10-16 NOTE — MAU Provider Note (Signed)
History     CSN: [id number]  Arrival date and time: 10/16/22 1715    Chief Complaint  Patient presents with   Hypertension   HPI This is a 27 year old G2 P1-0-0-1 at 39 weeks and 5 days who presents with elevated blood pressures at home with mild headache and racing heart rate.  She has been feeling this way all day and came to the hospital for evaluation as things were not improving.  She does have a couple of black spots, but this has been going on for few weeks.  She does have a history of preeclampsia and has been on baby aspirin.  OB History     Gravida  2   Para  1   Term  1   Preterm      AB      Living  1      SAB      IAB      Ectopic      Multiple      Live Births  1           Past Medical History:  Diagnosis Date   Anxiety    Left ovarian cyst     Past Surgical History:  Procedure Laterality Date   TONSILLECTOMY AND ADENOIDECTOMY      Family History  Problem Relation Age of Onset   Healthy Mother    Asthma Father    Hypertension Father    Heart disease Paternal Grandmother    Cancer Paternal Grandmother    Diabetes Paternal Grandfather     Social History   Tobacco Use   Smoking status: Never   Smokeless tobacco: Never  Vaping Use   Vaping Use: Never used  Substance Use Topics   Alcohol use: No   Drug use: No    Allergies:  Allergies  Allergen Reactions   Codeine Other (See Comments)    hallucinations  hallucinations   hallucinations   hallucinations    Medications Prior to Admission  Medication Sig Dispense Refill Last Dose   aspirin EC 81 MG tablet Take 1 tablet (81 mg total) by mouth daily. Take after 12 weeks for prevention of preeclampsia later in pregnancy 300 tablet 2    Cyanocobalamin (VITAMIN B-12 IJ) Inject as directed.      famotidine (PEPCID) 20 MG tablet Take 20 mg by mouth 2 (two) times daily.      Ferrous Sulfate (IRON PO) Take 1 tablet by mouth every morning.      loratadine (CLARITIN) 5 MG  chewable tablet Chew 5 mg by mouth daily.      pantoprazole (PROTONIX) 40 MG tablet Take 1 tablet (40 mg total) by mouth at bedtime. 30 tablet 1    Prenatal MV-Min-Fe Fum-FA-DHA (VITAFOL-OB+DHA) 65-1 & 250 MG MISC Take 1 capsule by mouth daily in the afternoon.      terconazole (TERAZOL 3) 0.8 % vaginal cream Place 1 applicator vaginally at bedtime. 20 g 0     Review of Systems Physical Exam   Blood pressure 123/87, pulse (!) 105, temperature 98.2 F (36.8 C), temperature source Oral, resp. rate 20, last menstrual period 01/11/2022. Patient Vitals for the past 24 hrs:  BP Temp Temp src Pulse Resp  10/16/22 1845 (!) 135/92 -- -- (!) 101 --  10/16/22 1830 123/87 -- -- (!) 105 --  10/16/22 1828 -- 98.2 F (36.8 C) Oral -- 20  10/16/22 1823 (!) 136/95 -- -- 100 --    Physical Exam Vitals and  nursing note reviewed.  Constitutional:      Appearance: Normal appearance.  Cardiovascular:     Rate and Rhythm: Normal rate and regular rhythm.  Pulmonary:     Effort: Pulmonary effort is normal.     Breath sounds: Normal breath sounds.  Abdominal:     General: Abdomen is flat.     Palpations: Abdomen is soft.  Skin:    General: Skin is dry.     Capillary Refill: Capillary refill takes less than 2 seconds.  Neurological:     General: No focal deficit present.     Mental Status: She is alert.  Psychiatric:        Mood and Affect: Mood normal.        Behavior: Behavior normal.        Thought Content: Thought content normal.        Judgment: Judgment normal.    MAU Course  Procedures NST:  Baseline: 130  Variability: moderate Accelerations: present  Decelerations: none Contractions: none  MDM BPs elevated here. Will move towards delivery. CHeck PEC labs. PCN for GBS.  Assessment and Plan    Tricia Allen 10/16/2022, 6:51 PM

## 2022-10-16 NOTE — Telephone Encounter (Signed)
Talked with patient - having headache and elevated BP at home. Has h/o PEC. Will go to hospital for evaluation.

## 2022-10-16 NOTE — Progress Notes (Signed)
Pt informed that the ultrasound is considered a limited OB ultrasound and is not intended to be a complete ultrasound exam.  Patient also informed that the ultrasound is not being completed with the intent of assessing for fetal or placental anomalies or any pelvic abnormalities.  Explained that the purpose of today's ultrasound is to assess for  presentation.  Patient acknowledges the purpose of the exam and the limitations of the study.    Cephalic presentation confirmed.  

## 2022-10-16 NOTE — H&P (Signed)
OBSTETRIC ADMISSION HISTORY AND PHYSICAL  Tricia Allen is a 27 y.o. female G2P1001 with IUP at [redacted]w[redacted]d by LMP presenting for IOL. She reports +FMs, No LOF, no VB, no blurry vision, headaches or peripheral edema, and RUQ pain.  She plans on bottle feeding. She request condoms for birth control. She received her prenatal care at  [facility]    Dating: By LMP --->  Estimated Date of Delivery: 10/18/22  Sono:    @[redacted]w[redacted]d , CWD, normal anatomy, cephalic presentation, posterior placental lie, 1878g, 34% EFW   Prenatal History/Complications:  -Hx of preE in prior pregnancy -Rubella NI -GBS+  Past Medical History: Past Medical History:  Diagnosis Date   Anxiety    Left ovarian cyst     Past Surgical History: Past Surgical History:  Procedure Laterality Date   TONSILLECTOMY AND ADENOIDECTOMY      Obstetrical History: OB History     Gravida  2   Para  1   Term  1   Preterm      AB      Living  1      SAB      IAB      Ectopic      Multiple      Live Births  1           Social History Social History   Socioeconomic History   Marital status: Single    Spouse name: Not on file   Number of children: Not on file   Years of education: Not on file   Highest education level: Not on file  Occupational History   Not on file  Tobacco Use   Smoking status: Never   Smokeless tobacco: Never  Vaping Use   Vaping Use: Never used  Substance and Sexual Activity   Alcohol use: No   Drug use: No   Sexual activity: Not Currently    Birth control/protection: None  Other Topics Concern   Not on file  Social History Narrative   Not on file   Social Determinants of Health   Financial Resource Strain: Not on file  Food Insecurity: Not on file  Transportation Needs: Not on file  Physical Activity: Not on file  Stress: Not on file  Social Connections: Not on file    Family History: Family History  Problem Relation Age of Onset   Healthy Mother    Asthma Father     Hypertension Father    Heart disease Paternal Grandmother    Cancer Paternal Grandmother    Diabetes Paternal Grandfather     Allergies: Allergies  Allergen Reactions   Codeine Other (See Comments)    hallucinations  hallucinations   hallucinations   hallucinations    Medications Prior to Admission  Medication Sig Dispense Refill Last Dose   loratadine (CLARITIN) 5 MG chewable tablet Chew 5 mg by mouth daily.   Past Week   aspirin EC 81 MG tablet Take 1 tablet (81 mg total) by mouth daily. Take after 12 weeks for prevention of preeclampsia later in pregnancy 300 tablet 2    Cyanocobalamin (VITAMIN B-12 IJ) Inject as directed.      famotidine (PEPCID) 20 MG tablet Take 20 mg by mouth 2 (two) times daily.      Ferrous Sulfate (IRON PO) Take 1 tablet by mouth every morning.      pantoprazole (PROTONIX) 40 MG tablet Take 1 tablet (40 mg total) by mouth at bedtime. 30 tablet 1    Prenatal MV-Min-Fe  Fum-FA-DHA (VITAFOL-OB+DHA) 65-1 & 250 MG MISC Take 1 capsule by mouth daily in the afternoon.      terconazole (TERAZOL 3) 0.8 % vaginal cream Place 1 applicator vaginally at bedtime. 20 g 0      Review of Systems   All systems reviewed and negative except as stated in HPI  Blood pressure 133/89, pulse 95, temperature 98.2 F (36.8 C), temperature source Oral, resp. rate 17, last menstrual period 01/11/2022, SpO2 100 %. General appearance: alert and no distress Lungs: normal effort Heart: regular rate noted Abdomen: gravid Extremities: No LE edema Presentation: cephalic Fetal monitoringBaseline: 125 bpm, Variability: Good {> 6 bpm), Accelerations: Reactive, and Decelerations: Absent Uterine activity every 1-3 mins  SVE: 1.5/thick/-3  Prenatal labs: ABO, Rh: --/--/A POS (05/13 2031) Antibody: NEG (05/13 2031) Rubella: <0.90 (10/23 1026) RPR: Non Reactive (02/13 0903)  HBsAg: Negative (10/23 1026)  HIV: Non Reactive (02/13 0903)  GBS: Positive/-- (04/24 0850)  1 hr Glucola  142 Genetic screening  Neg, LR female Anatomy US Bilateral CPC and low lying placenta resolved  Prenatal Transfer Tool  Maternal Diabetes: No Genetic Screening: Normal Maternal Ultrasounds/Referrals: Isolated choroid plexus cyst and Other: Low lying placenta. Both resolved on subsequent scans.  Fetal Ultrasounds or other Referrals:  None Maternal Substance Abuse:  No Significant Maternal Medications:  Meds include: Protonix Significant Maternal Lab Results:  Group B Strep positive and Other: Rubella NI Number of Prenatal Visits:greater than 3 verified prenatal visits Other Comments:  None  Results for orders placed or performed during the hospital encounter of 10/16/22 (from the past 24 hour(s))  CBC   Collection Time: 10/16/22  7:23 PM  Result Value Ref Range   WBC 11.3 (H) 4.0 - 10.5 K/uL   RBC 3.30 (L) 3.87 - 5.11 MIL/uL   Hemoglobin 10.1 (L) 12.0 - 15.0 g/dL   HCT 16.1 (L) 09.6 - 04.5 %   MCV 93.6 80.0 - 100.0 fL   MCH 30.6 26.0 - 34.0 pg   MCHC 32.7 30.0 - 36.0 g/dL   RDW 40.9 81.1 - 91.4 %   Platelets 275 150 - 400 K/uL   nRBC 0.0 0.0 - 0.2 %  Comprehensive metabolic panel   Collection Time: 10/16/22  7:23 PM  Result Value Ref Range   Sodium 136 135 - 145 mmol/L   Potassium 3.6 3.5 - 5.1 mmol/L   Chloride 104 98 - 111 mmol/L   CO2 20 (L) 22 - 32 mmol/L   Glucose, Bld 86 70 - 99 mg/dL   BUN <5 (L) 6 - 20 mg/dL   Creatinine, Ser 7.82 0.44 - 1.00 mg/dL   Calcium 9.1 8.9 - 95.6 mg/dL   Total Protein 5.9 (L) 6.5 - 8.1 g/dL   Albumin 2.9 (L) 3.5 - 5.0 g/dL   AST 18 15 - 41 U/L   ALT 12 0 - 44 U/L   Alkaline Phosphatase 93 38 - 126 U/L   Total Bilirubin 0.4 0.3 - 1.2 mg/dL   GFR, Estimated >21 >30 mL/min   Anion gap 12 5 - 15  Protein / creatinine ratio, urine   Collection Time: 10/16/22  7:55 PM  Result Value Ref Range   Creatinine, Urine 91 mg/dL   Total Protein, Urine 10 mg/dL   Protein Creatinine Ratio 0.11 0.00 - 0.15 mg/mg[Cre]  Type and screen   Collection  Time: 10/16/22  8:31 PM  Result Value Ref Range   ABO/RH(D) A POS    Antibody Screen NEG  Sample Expiration      10/19/2022,2359 Performed at [facility], 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 G2P1001 at [redacted]w[redacted]d here for IOL 2/2 gHTN.   #Labor: Cyto 50/25 vag/oral. Consider FB/AROM at next exam.  #Pain: Planning for epidural #FWB: Cat I  #ID: GBS pos, PCN ppx #MOF: Bottle #MOC: Condoms #Circ:  Yes  gHTN BP mild range. Pre E labs wnl. Hx of preE -Continue to monitor -Start BP protocol  Majestic Brister Autry-Lott, DO  10/16/2022, 9:53 PM

## 2022-10-16 NOTE — Plan of Care (Signed)
  Problem: Education: Goal: Knowledge of Childbirth will improve Outcome: Progressing Goal: Ability to make informed decisions regarding treatment and plan of care will improve Outcome: Progressing Goal: Ability to state and carry out methods to decrease the pain will improve Outcome: Progressing Goal: Individualized Educational Video(s) Outcome: Progressing   Problem: Coping: Goal: Ability to verbalize concerns and feelings about labor and delivery will improve Outcome: Progressing   Problem: Life Cycle: Goal: Ability to make normal progression through stages of labor will improve Outcome: Progressing Goal: Ability to effectively push during vaginal delivery will improve Outcome: Progressing   Problem: Role Relationship: Goal: Will demonstrate positive interactions with the child Outcome: Progressing   Problem: Safety: Goal: Risk of complications during labor and delivery will decrease Outcome: Progressing   Problem: Pain Management: Goal: Relief or control of pain from uterine contractions will improve Outcome: Progressing   Problem: Education: Goal: Knowledge of disease or condition will improve Outcome: Progressing Goal: Knowledge of the prescribed therapeutic regimen will improve Outcome: Progressing   Problem: Fluid Volume: Goal: Peripheral tissue perfusion will improve Outcome: Progressing   Problem: Clinical Measurements: Goal: Complications related to disease process, condition or treatment will be avoided or minimized Outcome: Progressing   Problem: Education: Goal: Knowledge of General Education information will improve Description: Including pain rating scale, medication(s)/side effects and non-pharmacologic comfort measures Outcome: Progressing   Problem: Health Behavior/Discharge Planning: Goal: Ability to manage health-related needs will improve Outcome: Progressing   Problem: Clinical Measurements: Goal: Ability to maintain clinical measurements  within normal limits will improve Outcome: Progressing Goal: Will remain free from infection Outcome: Progressing Goal: Diagnostic test results will improve Outcome: Progressing Goal: Respiratory complications will improve Outcome: Progressing Goal: Cardiovascular complication will be avoided Outcome: Progressing   Problem: Activity: Goal: Risk for activity intolerance will decrease Outcome: Progressing   Problem: Nutrition: Goal: Adequate nutrition will be maintained Outcome: Progressing   Problem: Coping: Goal: Level of anxiety will decrease Outcome: Progressing   Problem: Elimination: Goal: Will not experience complications related to bowel motility Outcome: Progressing Goal: Will not experience complications related to urinary retention Outcome: Progressing   Problem: Pain Managment: Goal: General experience of comfort will improve Outcome: Progressing   Problem: Safety: Goal: Ability to remain free from injury will improve Outcome: Progressing   Problem: Skin Integrity: Goal: Risk for impaired skin integrity will decrease Outcome: Progressing   

## 2022-10-17 ENCOUNTER — Inpatient Hospital Stay (HOSPITAL_COMMUNITY): Payer: Medicaid Other | Admitting: Anesthesiology

## 2022-10-17 ENCOUNTER — Encounter (HOSPITAL_COMMUNITY): Payer: Self-pay | Admitting: Family Medicine

## 2022-10-17 DIAGNOSIS — O480 Post-term pregnancy: Secondary | ICD-10-CM | POA: Diagnosis not present

## 2022-10-17 DIAGNOSIS — O99344 Other mental disorders complicating childbirth: Secondary | ICD-10-CM | POA: Diagnosis not present

## 2022-10-17 DIAGNOSIS — Z3A39 39 weeks gestation of pregnancy: Secondary | ICD-10-CM | POA: Diagnosis not present

## 2022-10-17 DIAGNOSIS — O9902 Anemia complicating childbirth: Secondary | ICD-10-CM | POA: Diagnosis not present

## 2022-10-17 DIAGNOSIS — Z23 Encounter for immunization: Secondary | ICD-10-CM | POA: Diagnosis not present

## 2022-10-17 DIAGNOSIS — Z3A40 40 weeks gestation of pregnancy: Secondary | ICD-10-CM | POA: Diagnosis not present

## 2022-10-17 DIAGNOSIS — O99824 Streptococcus B carrier state complicating childbirth: Secondary | ICD-10-CM | POA: Diagnosis not present

## 2022-10-17 DIAGNOSIS — O139 Gestational [pregnancy-induced] hypertension without significant proteinuria, unspecified trimester: Secondary | ICD-10-CM | POA: Insufficient documentation

## 2022-10-17 DIAGNOSIS — G43E09 Chronic migraine with aura, not intractable, without status migrainosus: Secondary | ICD-10-CM | POA: Diagnosis not present

## 2022-10-17 DIAGNOSIS — D649 Anemia, unspecified: Secondary | ICD-10-CM | POA: Diagnosis not present

## 2022-10-17 DIAGNOSIS — O134 Gestational [pregnancy-induced] hypertension without significant proteinuria, complicating childbirth: Secondary | ICD-10-CM | POA: Diagnosis not present

## 2022-10-17 DIAGNOSIS — O99820 Streptococcus B carrier state complicating pregnancy: Secondary | ICD-10-CM | POA: Diagnosis not present

## 2022-10-17 LAB — CBC
HCT: 32 % — ABNORMAL LOW (ref 36.0–46.0)
HCT: 29.2 % — ABNORMAL LOW (ref 36.0–46.0)
Hemoglobin: 10.4 g/dL — ABNORMAL LOW (ref 12.0–15.0)
Hemoglobin: 9.8 g/dL — ABNORMAL LOW (ref 12.0–15.0)
MCH: 30.8 pg (ref 26.0–34.0)
MCH: 30.8 pg (ref 26.0–34.0)
MCHC: 32.5 g/dL (ref 30.0–36.0)
MCHC: 33.6 g/dL (ref 30.0–36.0)
MCV: 94.7 fL (ref 80.0–100.0)
MCV: 91.8 fL (ref 80.0–100.0)
Platelets: 270 10*3/uL (ref 150–400)
Platelets: 275 10*3/uL (ref 150–400)
RBC: 3.38 MIL/uL — ABNORMAL LOW (ref 3.87–5.11)
RBC: 3.18 MIL/uL — ABNORMAL LOW (ref 3.87–5.11)
RDW: 15.3 % (ref 11.5–15.5)
RDW: 15.1 % (ref 11.5–15.5)
WBC: 17.6 10*3/uL — ABNORMAL HIGH (ref 4.0–10.5)
WBC: 14.8 10*3/uL — ABNORMAL HIGH (ref 4.0–10.5)
nRBC: 0 % (ref 0.0–0.2)
nRBC: 0 % (ref 0.0–0.2)

## 2022-10-17 LAB — COMPREHENSIVE METABOLIC PANEL
ALT: 13 U/L (ref 0–44)
AST: 24 U/L (ref 15–41)
Albumin: 2.5 g/dL — ABNORMAL LOW (ref 3.5–5.0)
Alkaline Phosphatase: 95 U/L (ref 38–126)
Anion gap: 9 (ref 5–15)
BUN: <5 mg/dL — ABNORMAL LOW (ref 6–20)
CO2: 22 mmol/L (ref 22–32)
Calcium: 9 mg/dL (ref 8.9–10.3)
Chloride: 105 mmol/L (ref 98–111)
Creatinine, Ser: 0.53 mg/dL (ref 0.44–1.00)
GFR, Estimated: >60 mL/min (ref >60)
Glucose, Bld: 90 mg/dL (ref 70–99)
Potassium: 3.6 mmol/L (ref 3.5–5.1)
Sodium: 136 mmol/L (ref 135–145)
Total Bilirubin: 0.6 mg/dL (ref 0.3–1.2)
Total Protein: 5.5 g/dL — ABNORMAL LOW (ref 6.5–8.1)

## 2022-10-17 LAB — RPR: RPR Ser Ql: NONREACTIVE

## 2022-10-17 MED ORDER — ONDANSETRON HCL 4 MG PO TABS: 4.0000 mg | ORAL_TABLET | ORAL | Status: DC | PRN

## 2022-10-17 MED ORDER — LIDOCAINE HCL (PF) 1 % IJ SOLN
INTRAMUSCULAR | Status: DC | PRN
  Administered 2022-10-17: 10 mL via EPIDURAL
  Administered 2022-10-17: 2 mL via EPIDURAL

## 2022-10-17 MED ORDER — IBUPROFEN 600 MG PO TABS
600.0000 mg | ORAL_TABLET | Freq: Four times a day (QID) | ORAL | Status: DC
  Administered 2022-10-17 – 2022-10-18 (×4): 600 mg via ORAL
  Filled 2022-10-17 (×4): qty 1

## 2022-10-17 MED ORDER — DIBUCAINE (PERIANAL) 1 % EX OINT: 1.0000 | TOPICAL_OINTMENT | CUTANEOUS | Status: DC | PRN

## 2022-10-17 MED ORDER — ACETAMINOPHEN 325 MG PO TABS
650.0000 mg | ORAL_TABLET | ORAL | Status: DC | PRN
  Administered 2022-10-17: 650 mg via ORAL
  Filled 2022-10-17 (×2): qty 2

## 2022-10-17 MED ORDER — SENNOSIDES-DOCUSATE SODIUM 8.6-50 MG PO TABS
2.0000 | ORAL_TABLET | ORAL | Status: DC
  Administered 2022-10-17 – 2022-10-18 (×2): 2 via ORAL
  Filled 2022-10-17 (×2): qty 2

## 2022-10-17 MED ORDER — FENTANYL-BUPIVACAINE-NACL 0.5-0.125-0.9 MG/250ML-% EP SOLN
EPIDURAL | Status: DC | PRN
  Administered 2022-10-17: 12 mL/h via EPIDURAL

## 2022-10-17 MED ORDER — COCONUT OIL OIL: 1.0000 | TOPICAL_OIL | Status: DC | PRN

## 2022-10-17 MED ORDER — NIFEDIPINE ER OSMOTIC RELEASE 30 MG PO TB24
30.0000 mg | ORAL_TABLET | Freq: Every day | ORAL | Status: DC
  Administered 2022-10-17 – 2022-10-18 (×2): 30 mg via ORAL
  Filled 2022-10-17 (×2): qty 1

## 2022-10-17 MED ORDER — SIMETHICONE 80 MG PO CHEW: 80.0000 mg | CHEWABLE_TABLET | ORAL | Status: DC | PRN

## 2022-10-17 MED ORDER — PRENATAL MULTIVITAMIN CH
1.0000 | ORAL_TABLET | Freq: Every day | ORAL | Status: DC
  Administered 2022-10-17 – 2022-10-18 (×2): 1 via ORAL
  Filled 2022-10-17 (×2): qty 1

## 2022-10-17 MED ORDER — FENTANYL CITRATE (PF) 100 MCG/2ML IJ SOLN
INTRAMUSCULAR | Status: DC | PRN
  Administered 2022-10-17: 100 ug via EPIDURAL

## 2022-10-17 MED ORDER — TERBUTALINE SULFATE 1 MG/ML IJ SOLN: 0.2500 mg | Freq: Once | INTRAMUSCULAR | Status: DC | PRN

## 2022-10-17 MED ORDER — BENZOCAINE-MENTHOL 20-0.5 % EX AERO
1.0000 | INHALATION_SPRAY | CUTANEOUS | Status: DC | PRN
  Administered 2022-10-17: 1 via TOPICAL
  Filled 2022-10-17: qty 56

## 2022-10-17 MED ORDER — WITCH HAZEL-GLYCERIN EX PADS: 1.0000 | MEDICATED_PAD | CUTANEOUS | Status: DC | PRN

## 2022-10-17 MED ORDER — FUROSEMIDE 20 MG PO TABS
20.0000 mg | ORAL_TABLET | Freq: Every day | ORAL | Status: DC
  Administered 2022-10-18: 20 mg via ORAL
  Filled 2022-10-17: qty 1

## 2022-10-17 MED ORDER — OXYTOCIN-SODIUM CHLORIDE 30-0.9 UT/500ML-% IV SOLN
1.0000 m[IU]/min | INTRAVENOUS | Status: DC
  Administered 2022-10-17: 2 m[IU]/min via INTRAVENOUS
  Filled 2022-10-17: qty 500

## 2022-10-17 MED ORDER — TETANUS-DIPHTH-ACELL PERTUSSIS 5-2.5-18.5 LF-MCG/0.5 IM SUSY: 0.5000 mL | PREFILLED_SYRINGE | Freq: Once | INTRAMUSCULAR | Status: DC

## 2022-10-17 MED ORDER — ZOLPIDEM TARTRATE 5 MG PO TABS: 5.0000 mg | ORAL_TABLET | Freq: Every evening | ORAL | Status: DC | PRN

## 2022-10-17 MED ORDER — BUPIVACAINE HCL (PF) 0.25 % IJ SOLN
INTRAMUSCULAR | Status: DC | PRN
  Administered 2022-10-17: 8 mL via EPIDURAL

## 2022-10-17 MED ORDER — DIPHENHYDRAMINE HCL 25 MG PO CAPS: 25.0000 mg | ORAL_CAPSULE | Freq: Four times a day (QID) | ORAL | Status: DC | PRN

## 2022-10-17 MED ORDER — ONDANSETRON HCL 4 MG/2ML IJ SOLN: 4.0000 mg | INTRAMUSCULAR | Status: DC | PRN

## 2022-10-17 NOTE — Progress Notes (Addendum)
Spoke to MD concerning pt's bp of 133/99 at 1644. MD stated he would concerning to monitor pt's bp. Informed MD that pt was complaining of right pain/pressure under her rib cage. MD stated that he would come and check on the patient. MD gave perimeters of 150/100 to be contacted.

## 2022-10-17 NOTE — Progress Notes (Addendum)
Labor Progress Note Tricia Allen is a 27 y.o. G2P1001 at [redacted]w[redacted]d presented for IOL.   S: Not comfortable with her epidural. Having discomfort with contractions.   O:  BP (!) 125/92   Pulse (!) 102   Temp 97.9 F (36.6 C) (Oral)   Resp 18   LMP 01/11/2022 (Exact Date)   SpO2 99%  EFM: 130bpm/moderate/+accels, no decels  CVE: Dilation: 5 Effacement (%): 80 Cervical Position: Middle Station: -2 Presentation: Vertex Exam by:: A.Hairston,RN  A&P: 27 y.o. G2P1001 [redacted]w[redacted]d here for IOL 2/2 gHTN.  #Labor: Progressing well. S/p SROM 2218. Pit at 2.  #Pain: Epidural, plan to call anesthesia to help with comfort level #FWB: Cat I  #GBS positive, PCN ppx  gHTN BP mild range. Pre E labs wnl. Hx of preE -Continue to monitor - BP protocol  Tricia Koloski Autry-Lott, DO 5:53 AM

## 2022-10-17 NOTE — Anesthesia Preprocedure Evaluation (Signed)
Anesthesia Evaluation  Patient identified by MRN, date of birth, ID band Patient awake    Reviewed: Allergy & Precautions, Patient's Chart, lab work & pertinent test results  Airway Mallampati: II  TM Distance: >3 FB Neck ROM: Full    Dental no notable dental hx.    Pulmonary neg pulmonary ROS   Pulmonary exam normal breath sounds clear to auscultation       Cardiovascular hypertension (gHTN), Normal cardiovascular exam Rhythm:Regular Rate:Normal     Neuro/Psych  Headaches PSYCHIATRIC DISORDERS Anxiety        GI/Hepatic Neg liver ROS,GERD  Controlled and Medicated,,  Endo/Other  negative endocrine ROS    Renal/GU negative Renal ROS  negative genitourinary   Musculoskeletal negative musculoskeletal ROS (+)    Abdominal   Peds negative pediatric ROS (+)  Hematology  (+) Blood dyscrasia, anemia Hb 10.1, plt 275   Anesthesia Other Findings   Reproductive/Obstetrics (+) Pregnancy                              Anesthesia Physical Anesthesia Plan  ASA: 2  Anesthesia Plan: Epidural   Post-op Pain Management:    Induction:   PONV Risk Score and Plan: 2  Airway Management Planned: Natural Airway  Additional Equipment: None  Intra-op Plan:   Post-operative Plan:   Informed Consent: I have reviewed the patients History and Physical, chart, labs and discussed the procedure including the risks, benefits and alternatives for the proposed anesthesia with the patient or authorized representative who has indicated his/her understanding and acceptance.       Plan Discussed with:   Anesthesia Plan Comments:          Anesthesia Quick Evaluation

## 2022-10-17 NOTE — Progress Notes (Signed)
Labor Progress Note Calin Fairfax is a 27 y.o. G2P1001 at [redacted]w[redacted]d presented for IOL.   S: No acute concerns. Having some discomfort with contractions.   O:  BP 117/81   Pulse 85   Temp 98.9 F (37.2 C) (Oral)   Resp 18   LMP 01/11/2022 (Exact Date)   SpO2 100%  EFM: 135bpm/moderate/+accels, no decels  CVE: Dilation: 4 Effacement (%): 80 Station: -2 Presentation: Vertex Exam by:: Autry-Lotts,MD  A&P: 27 y.o. G2P1001 [redacted]w[redacted]d here for IOL 2/2 gHTN.  #Labor: Progressing well. S/p SROM 1018. Start pit 2 by 2.  #Pain: Planning epidural #FWB: Cat I  #GBS positive, PCN ppx  gHTN BP mild range. Pre E labs wnl. Hx of preE -Continue to monitor - BP protocol  Arnel Wymer Autry-Lott, DO 2:14 AM

## 2022-10-17 NOTE — Anesthesia Procedure Notes (Signed)
Epidural Patient location during procedure: OB Start time: 10/17/2022 2:33 AM End time: 10/17/2022 2:41 AM  Staffing Anesthesiologist: Lannie Fields, DO Performed: anesthesiologist   Preanesthetic Checklist Completed: patient identified, IV checked, risks and benefits discussed, monitors and equipment checked, pre-op evaluation and timeout performed  Epidural Patient position: sitting Prep: DuraPrep and site prepped and draped Patient monitoring: continuous pulse ox, blood pressure, heart rate and cardiac monitor Approach: midline Location: L3-L4 Injection technique: LOR air  Needle:  Needle type: Tuohy  Needle gauge: 17 G Needle length: 9 cm Needle insertion depth: 4.5 cm Catheter type: closed end flexible Catheter size: 19 Gauge Catheter at skin depth: 10 cm Test dose: negative  Assessment Sensory level: T8 Events: blood not aspirated, no cerebrospinal fluid, injection not painful, no injection resistance, no paresthesia and negative IV test  Additional Notes Patient identified. Risks/Benefits/Options discussed with patient including but not limited to bleeding, infection, nerve damage, paralysis, failed block, incomplete pain control, headache, blood pressure changes, nausea, vomiting, reactions to medication both or allergic, itching and postpartum back pain. Confirmed with bedside nurse the patient's most recent platelet count. Confirmed with patient that they are not currently taking any anticoagulation, have any bleeding history or any family history of bleeding disorders. Patient expressed understanding and wished to proceed. All questions were answered. Sterile technique was used throughout the entire procedure. Please see nursing notes for vital signs. Test dose was given through epidural catheter and negative prior to continuing to dose epidural or start infusion. Warning signs of high block given to the patient including shortness of breath, tingling/numbness in  hands, complete motor block, or any concerning symptoms with instructions to call for help. Patient was given instructions on fall risk and not to get out of bed. All questions and concerns addressed with instructions to call with any issues or inadequate analgesia.  Reason for block:procedure for pain

## 2022-10-17 NOTE — Discharge Summary (Shared)
Postpartum Discharge Summary  Date of Service updated***     Patient Name: Tricia Allen DOB: 15-Oct-1995 MRN: [id number]  Date of admission: 10/16/2022 Delivery date:10/17/2022  Delivering provider: Lavonda Jumbo  Date of discharge: 10/17/2022  Admitting diagnosis: Post term pregnancy over 40 weeks [O48.0] Intrauterine pregnancy: [redacted]w[redacted]d     Secondary diagnosis:  Principal Problem:   Post term pregnancy over 40 weeks Active Problems:   Supervision of other normal pregnancy, antepartum   History of pre-eclampsia   Rubella non-immune status, antepartum   Chronic migraine with aura without status migrainosus, not intractable   GBS (group B Streptococcus carrier), +RV culture, currently pregnant   Gestational hypertension  Additional problems: ***    Discharge diagnosis: Term Pregnancy Delivered and Gestational Hypertension                                              Post partum procedures:{Postpartum procedures:23558} Augmentation: Pitocin and Cytotec Complications: None  Hospital course: Induction of Labor With Vaginal Delivery   27 y.o. yo G2P1001 at [redacted]w[redacted]d was admitted to the hospital 10/16/2022 for induction of labor.  Indication for induction: Gestational hypertension.  Patient had an uncomplicated labor course . Membrane Rupture Time/Date: 10:18 PM ,10/16/2022   Delivery Method:Vaginal, Spontaneous  Episiotomy:   Lacerations:    Details of delivery can be found in separate delivery note.  Patient had a postpartum course complicated by***. Patient is discharged home 10/17/22.  Newborn Data: Birth date:10/17/2022  Birth time:7:46 AM  Gender:Female  Living status:  Apgars: ,  Weight:   Magnesium Sulfate received: No BMZ received: No Rhophylac:N/A MMR:Yes T-DaP: ordered postpartum Flu: No Transfusion:{Transfusion received:30440034}  Physical exam  Vitals:   10/17/22 0627 10/17/22 0635 10/17/22 0645 10/17/22 0705  BP: 136/77 122/74 (!) 140/91 125/83  Pulse:  (!) 102 94 (!) 117 (!) 123  Resp:      Temp:      TempSrc:      SpO2:    100%   General: {Exam; general:21111117} Lochia: {Desc; appropriate/inappropriate:30686::"appropriate"} Uterine Fundus: {Desc; firm/soft:30687} Incision: {Exam; incision:21111123} DVT Evaluation: {Exam; dvt:2111122} Labs: Lab Results  Component Value Date   WBC 11.3 (H) 10/16/2022   HGB 10.1 (L) 10/16/2022   HCT 30.9 (L) 10/16/2022   MCV 93.6 10/16/2022   PLT 275 10/16/2022      Latest Ref Rng & Units 10/16/2022    7:23 PM  CMP  Glucose 70 - 99 mg/dL 86   BUN 6 - 20 mg/dL <5   Creatinine 4.09 - 1.00 mg/dL 8.11   Sodium 914 - 782 mmol/L 136   Potassium 3.5 - 5.1 mmol/L 3.6   Chloride 98 - 111 mmol/L 104   CO2 22 - 32 mmol/L 20   Calcium 8.9 - 10.3 mg/dL 9.1   Total Protein 6.5 - 8.1 g/dL 5.9   Total Bilirubin 0.3 - 1.2 mg/dL 0.4   Alkaline Phos 38 - 126 U/L 93   AST 15 - 41 U/L 18   ALT 0 - 44 U/L 12    Edinburgh Score:     No data to display           After visit meds:  Allergies as of 10/17/2022       Reactions   Codeine Other (See Comments)   Hallucinations     Med Rec must be completed prior to using  this SMARTLINK***        Discharge home in stable condition Infant Feeding: {Baby feeding:23562} Infant Disposition:{CHL IP OB HOME WITH ZOXWRU:04540} Discharge instruction: per After Visit Summary and Postpartum booklet. Activity: Advance as tolerated. Pelvic rest for 6 weeks.  Diet: {OB JWJX:91478295} Future Appointments: Future Appointments  Date Time Provider Department Center  10/19/2022 11:15 AM Levie Heritage, DO [facility] None  11/16/2022 11:15 AM Levie Heritage, DO [facility] None  12/14/2022  8:20 AM Tobb, Lavona Mound, DO [facility] None   Follow up Visit: Message sent   Please schedule this patient for a In person postpartum visit in 6 weeks with the following provider: Any provider. Additional Postpartum F/U:BP check 1 week  High risk pregnancy complicated by:  HTN Delivery mode:  Vaginal, Spontaneous  Anticipated Birth Control:  Condoms   10/17/2022 Celedonio Savage, MD

## 2022-10-17 NOTE — Progress Notes (Addendum)
Interim Progress Note  S: visited bedside due to elevated BP with reported RUQ pain. Pt feeling pain/pressure under R rib cage. Also has intermittent headache, no vision  changes. Endorses mild dizziness but able to ambulate. Denies new leg swelling. Endorses mild bilateral calf soreness with foot movement. Denies SOB, CP  O: BP (!) 139/96 (BP Location: Left Arm)   Pulse 95   Temp 98.2 F (36.8 C) (Oral)   Resp 19   LMP 01/11/2022 (Exact Date)   SpO2 99%   Breastfeeding Unknown    General: NAD Cardiac: RRR, no murmurs auscultated Respiratory: CTAB, normal WOB, no crackles or wheezes Abdomen: moderate tenderness to palpation of RUQ with no significant rebound tenderness or guarding. Firm uterus. Extremities: no significant lower extremity edema. No calf tenderness bilaterally. Homan's negative. Soreness in bilateral lower calves with dorsiflexion of feet bilaterally. Neuro: alert, no obvious focal deficits, speech normal  A/P: Elevated BP 2/2 gHTN, ppHTN with RUQ pain, HA, overall benign exam findings. Suspect calf pain 2/2 MSK rather than DVT given bilateral, neg homan's, nontender, no swelling, no SOB/CP. -Continue to monitor, continue procardia and lasix -BP not severe range, ok to notify for >150/100 -Check PEC labs -If labs wnl and BP persistently elevated, consider increasing AM procardia dose   Update 7:56 PM:  Evaluated pt at bedside due to new concern of left sided chest pain. Pt endorses sharp, nonradiating, intermittent pain in L upper chest, worse when moving/abducting her left upper extremity. Denies SOB, denies pain with deep breathing. Denies pain at rest.  Vitals:   10/17/22 1842 10/17/22 1917  BP: (!) 139/96 (!) 128/99  Pulse: 95 91  Resp:    Temp:    SpO2: 99% 99%   Exam unremarkable, unchanged from above. Nontender to palpation along chest wall or anterior L shoulder.   A/P: Low suspicion for cardiovascular etiology of chest pain given benign exam  findings and atypical distribution of pain, no SOB or hypoxemia.  Obtain EKG, continue to monitor BP, f/u CBC/CMP  Vonna Drafts, MD 10/17/2022, 6:54 PM PGY-1, [facility] Service pager [phone]

## 2022-10-17 NOTE — Progress Notes (Signed)
Called MD concerning patient's BP being 124/88. Was advised to concerning to monitor bp.

## 2022-10-17 NOTE — Lactation Note (Signed)
Lactation Consultation Note  Patient Name: Tricia Allen ZOXWR'U Date: 10/17/2022 Age:27 y.o.   Mother is formula feeding only. Confirmed by mother's nurse.   Christella Hartigan M 10/17/2022, 3:46 PM

## 2022-10-17 NOTE — Anesthesia Postprocedure Evaluation (Signed)
Anesthesia Post Note  Patient: Tricia Allen  Procedure(s) Performed: AN AD HOC LABOR EPIDURAL     Patient location during evaluation: Mother Baby Anesthesia Type: Epidural Level of consciousness: awake and alert Pain management: pain level controlled Vital Signs Assessment: post-procedure vital signs reviewed and stable Respiratory status: spontaneous breathing, nonlabored ventilation and respiratory function stable Cardiovascular status: stable Postop Assessment: no headache, no backache and epidural receding Anesthetic complications: no  No notable events documented.  Last Vitals:  Vitals:   10/17/22 1842 10/17/22 1917  BP: (!) 139/96 (!) 128/99  Pulse: 95 91  Resp:    Temp:    SpO2: 99% 99%    Last Pain:  Vitals:   10/17/22 1917  TempSrc:   PainSc: 8    Pain Goal: Patients Stated Pain Goal: 0 (10/16/22 1916)                 Emmaline Kluver N

## 2022-10-17 NOTE — Progress Notes (Signed)
Spoke to MD concerning pts bp of 126/93. Requested by MD to recheck bp in 1 hr. MD stated he would review results to see if any new meds needs to be added. No perimeters for bp given at this time.

## 2022-10-18 MED ORDER — MEASLES, MUMPS & RUBELLA VAC IJ SOLR
0.5000 mL | Freq: Once | INTRAMUSCULAR | Status: AC
  Administered 2022-10-18: 0.5 mL via SUBCUTANEOUS
  Filled 2022-10-18: qty 0.5

## 2022-10-18 MED ORDER — MEASLES, MUMPS & RUBELLA VAC IJ SOLR: 0.5000 mL | Freq: Once | INTRAMUSCULAR | Status: DC

## 2022-10-18 MED ORDER — NIFEDIPINE ER 30 MG PO TB24: 30.0000 mg | ORAL_TABLET | Freq: Every day | ORAL | 3 refills | Status: DC

## 2022-10-18 MED ORDER — ACETAMINOPHEN 325 MG PO TABS: 650.0000 mg | ORAL_TABLET | ORAL | 0 refills | Status: DC | PRN

## 2022-10-18 MED ORDER — FUROSEMIDE 20 MG PO TABS: 20.0000 mg | ORAL_TABLET | Freq: Every day | ORAL | 0 refills | Status: DC

## 2022-10-18 MED ORDER — IBUPROFEN 600 MG PO TABS: 600.0000 mg | ORAL_TABLET | Freq: Four times a day (QID) | ORAL | 0 refills | Status: DC

## 2022-10-18 NOTE — Progress Notes (Signed)
MOB was referred for history of anxiety.  * Referral screened out by Clinical Social Worker because none of the following criteria appear to apply:  ~ History of anxiety during this pregnancy, or of post-partum depression following prior delivery.  ~ Diagnosis of anxiety within last 3 years  Per OB notes, MOB did not indicate any signs/symptoms of anxiety during pregnancy.  OR  * MOB's symptoms currently being treated with medication and/or therapy.   Please contact the Clinical Social Worker if needs arise, by MOB request, or if MOB scores greater than 9/yes to question 10 on Edinburgh Postpartum Depression Screen.   Taiz Bickle, LCSWA Clinical Social Worker [phone] 

## 2022-10-19 ENCOUNTER — Ambulatory Visit: Payer: Medicaid Other

## 2022-10-19 ENCOUNTER — Encounter: Payer: Medicaid Other | Admitting: Family Medicine

## 2022-10-20 ENCOUNTER — Encounter: Payer: Self-pay | Admitting: Family Medicine

## 2022-10-21 ENCOUNTER — Other Ambulatory Visit: Payer: Self-pay

## 2022-10-21 ENCOUNTER — Encounter (HOSPITAL_COMMUNITY): Payer: Self-pay | Admitting: Emergency Medicine

## 2022-10-21 ENCOUNTER — Encounter: Payer: Self-pay | Admitting: Cardiology

## 2022-10-21 ENCOUNTER — Inpatient Hospital Stay (HOSPITAL_COMMUNITY)
Admission: EM | Admit: 2022-10-21 | Discharge: 2022-10-22 | Disposition: A | Discharge status: Home / Self Care | Payer: Medicaid Other | Attending: Obstetrics and Gynecology | Admitting: Obstetrics and Gynecology

## 2022-10-21 ENCOUNTER — Telehealth: Payer: Self-pay | Admitting: Obstetrics & Gynecology

## 2022-10-21 DIAGNOSIS — G4440 Drug-induced headache, not elsewhere classified, not intractable: Secondary | ICD-10-CM

## 2022-10-21 DIAGNOSIS — R0602 Shortness of breath: Secondary | ICD-10-CM | POA: Diagnosis not present

## 2022-10-21 DIAGNOSIS — R519 Headache, unspecified: Secondary | ICD-10-CM | POA: Diagnosis not present

## 2022-10-21 DIAGNOSIS — R0781 Pleurodynia: Secondary | ICD-10-CM | POA: Diagnosis not present

## 2022-10-21 DIAGNOSIS — O139 Gestational [pregnancy-induced] hypertension without significant proteinuria, unspecified trimester: Secondary | ICD-10-CM

## 2022-10-21 DIAGNOSIS — R079 Chest pain, unspecified: Secondary | ICD-10-CM | POA: Diagnosis not present

## 2022-10-21 DIAGNOSIS — I10 Essential (primary) hypertension: Secondary | ICD-10-CM | POA: Diagnosis not present

## 2022-10-21 DIAGNOSIS — R000 Tachycardia, unspecified: Secondary | ICD-10-CM | POA: Diagnosis not present

## 2022-10-21 DIAGNOSIS — O9089 Other complications of the puerperium, not elsewhere classified: Secondary | ICD-10-CM | POA: Insufficient documentation

## 2022-10-21 DIAGNOSIS — R42 Dizziness and giddiness: Secondary | ICD-10-CM | POA: Insufficient documentation

## 2022-10-21 DIAGNOSIS — G43909 Migraine, unspecified, not intractable, without status migrainosus: Secondary | ICD-10-CM | POA: Diagnosis not present

## 2022-10-21 DIAGNOSIS — R110 Nausea: Secondary | ICD-10-CM | POA: Diagnosis not present

## 2022-10-21 DIAGNOSIS — Z79899 Other long term (current) drug therapy: Secondary | ICD-10-CM

## 2022-10-21 DIAGNOSIS — Z743 Need for continuous supervision: Secondary | ICD-10-CM | POA: Diagnosis not present

## 2022-10-21 NOTE — ED Triage Notes (Signed)
Pt in via [facility] with tachycardia, HTN and dizziness. Pt is 4 days postpartum - was pre-eclamptic and had induced vaginal delivery and [facility]. Pt has had dizziness and HTN throughout the day, [facility] and was discharged. Got home and symptoms started back tonight. Denies any RUQ pain, has been on Nifedipine since d/c and took it last at 7:30pm.  VS en route: 150'sHR 160/100 CBG 129 97%RA

## 2022-10-21 NOTE — Telephone Encounter (Signed)
Called pt- being seen at [facility] in [place].  Per pt work up so far has looked ok.  PLan to f/u as scheduled next week  Myna Hidalgo, DO Attending Obstetrician & Gynecologist, [facility] for [facility], [facility]

## 2022-10-22 ENCOUNTER — Encounter (HOSPITAL_COMMUNITY): Payer: Self-pay | Admitting: Obstetrics and Gynecology

## 2022-10-22 DIAGNOSIS — R000 Tachycardia, unspecified: Secondary | ICD-10-CM

## 2022-10-22 DIAGNOSIS — R519 Headache, unspecified: Secondary | ICD-10-CM | POA: Diagnosis not present

## 2022-10-22 DIAGNOSIS — G43909 Migraine, unspecified, not intractable, without status migrainosus: Secondary | ICD-10-CM | POA: Diagnosis not present

## 2022-10-22 DIAGNOSIS — O9089 Other complications of the puerperium, not elsewhere classified: Secondary | ICD-10-CM | POA: Diagnosis not present

## 2022-10-22 LAB — COMPREHENSIVE METABOLIC PANEL
ALT: 30 U/L (ref 0–44)
AST: 27 U/L (ref 15–41)
Albumin: 3.5 g/dL (ref 3.5–5.0)
Alkaline Phosphatase: 108 U/L (ref 38–126)
Anion gap: 12 (ref 5–15)
BUN: 8 mg/dL (ref 6–20)
CO2: 24 mmol/L (ref 22–32)
Calcium: 9.3 mg/dL (ref 8.9–10.3)
Chloride: 102 mmol/L (ref 98–111)
Creatinine, Ser: 0.66 mg/dL (ref 0.44–1.00)
GFR, Estimated: >60 mL/min (ref >60)
Glucose, Bld: 106 mg/dL — ABNORMAL HIGH (ref 70–99)
Potassium: 3.5 mmol/L (ref 3.5–5.1)
Sodium: 138 mmol/L (ref 135–145)
Total Bilirubin: 0.6 mg/dL (ref 0.3–1.2)
Total Protein: 7.1 g/dL (ref 6.5–8.1)

## 2022-10-22 LAB — CBC WITH DIFFERENTIAL/PLATELET
Abs Immature Granulocytes: 0.14 10*3/uL — ABNORMAL HIGH (ref 0.00–0.07)
Basophils Absolute: 0 10*3/uL (ref 0.0–0.1)
Basophils Relative: 0 %
Eosinophils Absolute: 0.3 10*3/uL (ref 0.0–0.5)
Eosinophils Relative: 3 %
HCT: 34.4 % — ABNORMAL LOW (ref 36.0–46.0)
Hemoglobin: 11.4 g/dL — ABNORMAL LOW (ref 12.0–15.0)
Immature Granulocytes: 1 %
Lymphocytes Relative: 15 %
Lymphs Abs: 1.9 10*3/uL (ref 0.7–4.0)
MCH: 30.2 pg (ref 26.0–34.0)
MCHC: 33.1 g/dL (ref 30.0–36.0)
MCV: 91 fL (ref 80.0–100.0)
Monocytes Absolute: 0.7 10*3/uL (ref 0.1–1.0)
Monocytes Relative: 6 %
Neutro Abs: 9 10*3/uL — ABNORMAL HIGH (ref 1.7–7.7)
Neutrophils Relative %: 75 %
Platelets: 369 10*3/uL (ref 150–400)
RBC: 3.78 MIL/uL — ABNORMAL LOW (ref 3.87–5.11)
RDW: 15.1 % (ref 11.5–15.5)
WBC: 12.1 10*3/uL — ABNORMAL HIGH (ref 4.0–10.5)
nRBC: 0 % (ref 0.0–0.2)

## 2022-10-22 LAB — URINALYSIS, ROUTINE W REFLEX MICROSCOPIC
Bacteria, UA: NONE SEEN
Bilirubin Urine: NEGATIVE
Glucose, UA: NEGATIVE mg/dL
Ketones, ur: NEGATIVE mg/dL
Nitrite: NEGATIVE
Protein, ur: 30 mg/dL — AB
Specific Gravity, Urine: 1.008 (ref 1.005–1.030)
pH: 8 (ref 5.0–8.0)

## 2022-10-22 MED ORDER — FUROSEMIDE 20 MG PO TABS: 20.0000 mg | ORAL_TABLET | Freq: Every day | ORAL | 0 refills | Status: DC

## 2022-10-22 MED ORDER — LISINOPRIL 5 MG PO TABS: 5.0000 mg | ORAL_TABLET | Freq: Every day | ORAL | 3 refills | Status: DC

## 2022-10-22 MED ORDER — ENALAPRIL MALEATE 5 MG PO TABS: 5.0000 mg | ORAL_TABLET | Freq: Every day | ORAL | 3 refills | Status: DC

## 2022-10-22 NOTE — ED Notes (Signed)
This RN spoke to MAU provider, and pt is cleared to come over to MAU for further eval

## 2022-10-22 NOTE — MAU Note (Signed)
Sent from [facility]  4 days PP vag delivery with c/o dizziness and headache.  Took tylenol and headache I down to a 2/10. Still feeling dizzy feels like her heart is racing. Taking procardia 30mg  daily.

## 2022-10-22 NOTE — MAU Provider Note (Signed)
History     CSN: [id number]  Arrival date and time: 10/21/22 2338   Event Date/Time   First Provider Initiated Contact with Patient 10/22/22 0145      Chief Complaint  Patient presents with   Dizziness   Tachycardia    Tricia Allen is a 27 y.o. G2P2002 at 4 Days PP from a SVD.  She  receives care at [facility].  She presents today, from [facility], for headache and tachycardia.  She reports she has been experiencing HA daily that is relieved with tylenol, but returns.  She states the HA is located "all over" and she describes it as a "throbbing ... Like a migraine."  She endorses current HA and rates it a 3/10, but questions if fatigue is causing it. She reports her bleeding is "good" with some small blood clots, but "nothing bad." She denies current pain, but reports having some RUQ, chest, and right leg pain this morning.    Medications: Ibuprofen, Lasix, Procardia (1930), Tylenol  OB History     Gravida  2   Para  2   Term  2   Preterm      AB      Living  2      SAB      IAB      Ectopic      Multiple  0   Live Births  2           Past Medical History:  Diagnosis Date   Anxiety    Left ovarian cyst     Past Surgical History:  Procedure Laterality Date   TONSILLECTOMY AND ADENOIDECTOMY      Family History  Problem Relation Age of Onset   Healthy Mother    Asthma Father    Hypertension Father    Heart disease Paternal Grandmother    Cancer Paternal Grandmother    Diabetes Paternal Grandfather     Social History   Tobacco Use   Smoking status: Never   Smokeless tobacco: Never  Vaping Use   Vaping Use: Never used  Substance Use Topics   Alcohol use: No   Drug use: No    Allergies:  Allergies  Allergen Reactions   Codeine Other (See Comments)    Hallucinations    Medications Prior to Admission  Medication Sig Dispense Refill Last Dose   acetaminophen (TYLENOL) 325 MG tablet Take 2 tablets (650 mg total) by mouth every 4  (four) hours as needed (for pain scale < 4). 30 tablet 0 10/21/2022   Ferrous Sulfate (IRON PO) Take 1 tablet by mouth every morning.   10/21/2022   furosemide (LASIX) 20 MG tablet Take 1 tablet (20 mg total) by mouth daily for 5 days. 5 tablet 0 10/21/2022   ibuprofen (ADVIL) 600 MG tablet Take 1 tablet (600 mg total) by mouth every 6 (six) hours. 30 tablet 0 10/21/2022   NIFEdipine (ADALAT CC) 30 MG 24 hr tablet Take 1 tablet (30 mg total) by mouth daily. 30 tablet 3 10/21/2022   Prenatal MV-Min-Fe Fum-FA-DHA (VITAFOL-OB+DHA) 65-1 & 250 MG MISC Take 1 capsule by mouth daily in the afternoon.   10/21/2022    Review of Systems  Constitutional:  Negative for chills and fever.  Cardiovascular:  Positive for chest pain (This morning, none currently).  Gastrointestinal:  Positive for nausea (None currently). Negative for abdominal pain, constipation, diarrhea and vomiting.  Genitourinary:  Positive for vaginal bleeding. Negative for difficulty urinating, dysuria and vaginal discharge.  Neurological:  Positive for dizziness, light-headedness and headaches.   Physical Exam   Blood pressure 108/73, pulse (!) 105, temperature 98.2 F (36.8 C), resp. rate 18, weight 80.7 kg, SpO2 98 %, not currently breastfeeding.  Vitals:   10/21/22 2345 10/22/22 0115 10/22/22 0214  BP: 124/83 108/73 111/75     Physical Exam Vitals reviewed.  Constitutional:      Appearance: Normal appearance.  HENT:     Head: Normocephalic and atraumatic.  Eyes:     Conjunctiva/sclera: Conjunctivae normal.  Cardiovascular:     Rate and Rhythm: Tachycardia present.  Pulmonary:     Effort: Pulmonary effort is normal. No respiratory distress.  Musculoskeletal:        General: Normal range of motion.     Cervical back: Normal range of motion.  Skin:    General: Skin is warm and dry.  Neurological:     Mental Status: She is alert and oriented to person, place, and time.  Psychiatric:        Mood and Affect: Mood normal.         Behavior: Behavior normal.     MAU Course  Procedures Results for orders placed or performed during the hospital encounter of 10/21/22 (from the past 24 hour(s))  CBC with Differential     Status: Abnormal   Collection Time: 10/22/22 12:57 AM  Result Value Ref Range   WBC 12.1 (H) 4.0 - 10.5 K/uL   RBC 3.78 (L) 3.87 - 5.11 MIL/uL   Hemoglobin 11.4 (L) 12.0 - 15.0 g/dL   HCT 16.1 (L) 09.6 - 04.5 %   MCV 91.0 80.0 - 100.0 fL   MCH 30.2 26.0 - 34.0 pg   MCHC 33.1 30.0 - 36.0 g/dL   RDW 40.9 81.1 - 91.4 %   Platelets 369 150 - 400 K/uL   nRBC 0.0 0.0 - 0.2 %   Neutrophils Relative % 75 %   Neutro Abs 9.0 (H) 1.7 - 7.7 K/uL   Lymphocytes Relative 15 %   Lymphs Abs 1.9 0.7 - 4.0 K/uL   Monocytes Relative 6 %   Monocytes Absolute 0.7 0.1 - 1.0 K/uL   Eosinophils Relative 3 %   Eosinophils Absolute 0.3 0.0 - 0.5 K/uL   Basophils Relative 0 %   Basophils Absolute 0.0 0.0 - 0.1 K/uL   Immature Granulocytes 1 %   Abs Immature Granulocytes 0.14 (H) 0.00 - 0.07 K/uL  Comprehensive metabolic panel     Status: Abnormal   Collection Time: 10/22/22 12:57 AM  Result Value Ref Range   Sodium 138 135 - 145 mmol/L   Potassium 3.5 3.5 - 5.1 mmol/L   Chloride 102 98 - 111 mmol/L   CO2 24 22 - 32 mmol/L   Glucose, Bld 106 (H) 70 - 99 mg/dL   BUN 8 6 - 20 mg/dL   Creatinine, Ser 7.82 0.44 - 1.00 mg/dL   Calcium 9.3 8.9 - 95.6 mg/dL   Total Protein 7.1 6.5 - 8.1 g/dL   Albumin 3.5 3.5 - 5.0 g/dL   AST 27 15 - 41 U/L   ALT 30 0 - 44 U/L   Alkaline Phosphatase 108 38 - 126 U/L   Total Bilirubin 0.6 0.3 - 1.2 mg/dL   GFR, Estimated >21 >30 mL/min   Anion gap 12 5 - 15  Urinalysis, Routine w reflex microscopic -Urine, Clean Catch     Status: Abnormal   Collection Time: 10/22/22  1:26 AM  Result Value Ref Range  Color, Urine STRAW (A) YELLOW   APPearance CLEAR CLEAR   Specific Gravity, Urine 1.008 1.005 - 1.030   pH 8.0 5.0 - 8.0   Glucose, UA NEGATIVE NEGATIVE mg/dL   Hgb urine  dipstick MODERATE (A) NEGATIVE   Bilirubin Urine NEGATIVE NEGATIVE   Ketones, ur NEGATIVE NEGATIVE mg/dL   Protein, ur 30 (A) NEGATIVE mg/dL   Nitrite NEGATIVE NEGATIVE   Leukocytes,Ua MODERATE (A) NEGATIVE   RBC / HPF 11-20 0 - 5 RBC/hpf   WBC, UA 21-50 0 - 5 WBC/hpf   Bacteria, UA NONE SEEN NONE SEEN   Squamous Epithelial / HPF 0-5 0 - 5 /HPF   Non Squamous Epithelial 0-5 (A) NONE SEEN    MDM Ekg Vitals Labs Reviewed Consult Prescription Assessment and Plan  27 year old PP State GHTN Headache Dizziness Tachycardia  -Labs collected at [facility].  -Reviewed labs and informed findings not suggestive of PreE. -Discussed side effects associated with medications.  -Patient requesting to discontinue medications and provider advises against.  -Reviewed postpartum PreEclampsia diagnosis and risks.  -Reassured that bp normotensive. -Dr. Austin Miles consulted and informed of patient status, evaluation, interventions, and results. Advised: *Discontinue Procardia. *Start Vasotec 5mg  daily *Give additional 3-4 day dosage of Lasix.  -Patient updated on POC and agreeable. -Instructed to continue to take blood pressures per baby scripts protocol and keep appt for Thursday. -Precautions reviewed. -Encouraged to call primary office or return to MAU if symptoms worsen or with the onset of new symptoms. -Discharged to home in stable condition.  Cherre Robins 10/22/2022, 1:45 AM

## 2022-10-23 ENCOUNTER — Telehealth: Payer: Self-pay

## 2022-10-23 NOTE — Telephone Encounter (Signed)
Patient had multiple high blood pressure reading via babyscripts app. Patient was seen in MAU and medications switched. Armandina Stammer RN

## 2022-10-24 DIAGNOSIS — O8620 Urinary tract infection following delivery, unspecified: Secondary | ICD-10-CM | POA: Diagnosis not present

## 2022-10-24 DIAGNOSIS — I10 Essential (primary) hypertension: Secondary | ICD-10-CM | POA: Diagnosis not present

## 2022-10-24 DIAGNOSIS — N390 Urinary tract infection, site not specified: Secondary | ICD-10-CM | POA: Diagnosis not present

## 2022-10-24 MED ORDER — ONDANSETRON 4 MG PO TBDP: 4.0000 mg | ORAL_TABLET | Freq: Four times a day (QID) | ORAL | 0 refills | Status: DC | PRN

## 2022-10-24 MED ORDER — ENALAPRIL MALEATE 10 MG PO TABS: 10.0000 mg | ORAL_TABLET | Freq: Every day | ORAL | 1 refills | Status: DC

## 2022-10-25 ENCOUNTER — Inpatient Hospital Stay (HOSPITAL_COMMUNITY): Admission: AD | Admit: 2022-10-25 | Payer: Medicaid Other | Source: Home / Self Care | Admitting: Family Medicine

## 2022-10-25 ENCOUNTER — Inpatient Hospital Stay (HOSPITAL_COMMUNITY): Payer: Medicaid Other

## 2022-10-25 ENCOUNTER — Ambulatory Visit (INDEPENDENT_AMBULATORY_CARE_PROVIDER_SITE_OTHER): Payer: Medicaid Other | Admitting: Obstetrics & Gynecology

## 2022-10-25 VITALS — BP 121/85 | HR 99 | Wt 154.0 lb

## 2022-10-25 DIAGNOSIS — O165 Unspecified maternal hypertension, complicating the puerperium: Secondary | ICD-10-CM

## 2022-10-25 NOTE — Progress Notes (Signed)
History:  27 y.o. [id number] here today for f/u of BP. She reports that her BP meds were changed recently and she reports that she has had decreased appetite since being on enalorpril x 4 days.  Sh was seen in the MAU and dx'd with a UTI. She has not started the atbx for the UTI.    The following portions of the patient's history were reviewed and updated as appropriate: allergies, current medications, past family history, past medical history, past social history, past surgical history and problem list.  Review of Systems:  Pertinent items are noted in HPI.    Objective:  Physical Exam Blood pressure 121/85, pulse 99, weight 154 lb (69.9 kg), not currently breastfeeding. CONSTITUTIONAL: Well-developed, well-nourished female in no acute distress. Pt does not appear ill.  HENT:  Normocephalic, atraumatic EYES: Conjunctivae and EOM are normal. No scleral icterus.  NECK: Normal range of motion SKIN: Skin is warm and dry. No rash noted. Not diaphoretic.No pallor. NEUROLGIC: Alert and oriented to person, place, and time. Normal coordination.    Labs and Imaging DG Chest Portable 1 View  Result Date: 10/11/2022 CLINICAL DATA:  Thirty-nine weeks pregnant.  Chest pain. EXAM: PORTABLE CHEST 1 VIEW COMPARISON:  09/19/2019 FINDINGS: The heart size and mediastinal contours are within normal limits. Both lungs are clear. The visualized skeletal structures are unremarkable. IMPRESSION: No active disease. Electronically Signed   By: Norva Pavlov M.D.   On: 10/11/2022 16:16    Assessment & Plan:  Elevated postpartum BP.   Given that her sx are new and she has other new medical conditions that may confound the sx Rec cont Enalapril until UTI treated. If sx persist, rec changing to Amlodipine.     UTI    Pt has rx from pharmacy  Take meds for UTI  After that, if sx persist chagne to Amlodipine   F/u in 2 weeks for repeat BP.   Kue Fox L. Harraway-Smith, M.D., [facility]

## 2022-10-30 ENCOUNTER — Encounter: Payer: Self-pay | Admitting: Obstetrics & Gynecology

## 2022-10-30 MED ORDER — AMLODIPINE BESYLATE 5 MG PO TABS
5.0000 mg | ORAL_TABLET | Freq: Every day | ORAL | 1 refills | Status: DC
Start: 2022-10-30 — End: 2022-11-16

## 2022-11-04 DIAGNOSIS — Z419 Encounter for procedure for purposes other than remedying health state, unspecified: Secondary | ICD-10-CM | POA: Diagnosis not present

## 2022-11-08 ENCOUNTER — Ambulatory Visit (INDEPENDENT_AMBULATORY_CARE_PROVIDER_SITE_OTHER): Payer: Medicaid Other

## 2022-11-08 VITALS — BP 113/75 | HR 81

## 2022-11-08 DIAGNOSIS — Z79899 Other long term (current) drug therapy: Secondary | ICD-10-CM | POA: Diagnosis not present

## 2022-11-08 DIAGNOSIS — O165 Unspecified maternal hypertension, complicating the puerperium: Secondary | ICD-10-CM

## 2022-11-08 LAB — BASIC METABOLIC PANEL
Chloride: 101 mmol/L (ref 96–106)
Sodium: 139 mmol/L (ref 134–144)

## 2022-11-08 NOTE — Progress Notes (Signed)
Subjective:  Tricia Allen is a 27 y.o. female here for BP check.   Hypertension ROS: taking medications as instructed, no medication side effects noted, no TIA's, no chest pain on exertion, no dyspnea on exertion, and no swelling of ankles.    Objective:  BP 113/75   Pulse 81   Appearance alert, well appearing, and in no distress. General exam BP noted to be well controlled today in office.    Assessment:   Blood Pressure well controlled.   Plan:  Current treatment plan is effective, no change in therapy.Marland Kitchen

## 2022-11-09 LAB — BASIC METABOLIC PANEL
BUN/Creatinine Ratio: 13 (ref 9–23)
BUN: 9 mg/dL (ref 6–20)
CO2: 23 mmol/L (ref 20–29)
Calcium: 10.4 mg/dL — ABNORMAL HIGH (ref 8.7–10.2)
Glucose: 89 mg/dL (ref 70–99)
Potassium: 4.7 mmol/L (ref 3.5–5.2)

## 2022-11-09 LAB — MAGNESIUM: Magnesium: 1.8 mg/dL (ref 1.6–2.3)

## 2022-11-16 ENCOUNTER — Ambulatory Visit (INDEPENDENT_AMBULATORY_CARE_PROVIDER_SITE_OTHER): Payer: Medicaid Other | Admitting: Family Medicine

## 2022-11-16 ENCOUNTER — Encounter: Payer: Self-pay | Admitting: Family Medicine

## 2022-11-16 DIAGNOSIS — O165 Unspecified maternal hypertension, complicating the puerperium: Secondary | ICD-10-CM | POA: Diagnosis not present

## 2022-11-16 DIAGNOSIS — R1084 Generalized abdominal pain: Secondary | ICD-10-CM | POA: Diagnosis not present

## 2022-11-16 DIAGNOSIS — N3000 Acute cystitis without hematuria: Secondary | ICD-10-CM | POA: Diagnosis not present

## 2022-11-16 DIAGNOSIS — R102 Pelvic and perineal pain: Secondary | ICD-10-CM | POA: Diagnosis not present

## 2022-11-16 NOTE — Telephone Encounter (Signed)
I called patient and left message on her phone: if pain is severe, not improved with ibuprofen/tylenol, then she should be seen at either urgent care or at the hospital. Will try to contact tomorrow.

## 2022-11-16 NOTE — Progress Notes (Signed)
Post Partum Visit Note  Tricia Allen is a 27 y.o. G4P2002 female who presents for a postpartum visit. She is 4 weeks postpartum following a normal spontaneous vaginal delivery.  I have fully reviewed the prenatal and intrapartum course. The delivery was at 39.6 gestational weeks.  Anesthesia: epidural. Postpartum course has been complicated by fast heart rate. Baby is doing well. Baby is feeding by bottle - Similac 360 . Bleeding no bleeding. Bowel function is normal. Bladder function is normal. Patient is not sexually active. Contraception method is none. Postpartum depression screening: negative.   The pregnancy intention screening data noted above was reviewed. Potential methods of contraception were discussed. The patient elected to proceed with No data recorded.   Edinburgh Postnatal Depression Scale - 11/16/22 1128       Edinburgh Postnatal Depression Scale:  In the Past 7 Days   I have been able to laugh and see the funny side of things. 0    I have looked forward with enjoyment to things. 0    I have blamed myself unnecessarily when things went wrong. 1    I have been anxious or worried for no good reason. 2    I have felt scared or panicky for no good reason. 1    Things have been getting on top of me. 1    I have been so unhappy that I have had difficulty sleeping. 0    I have felt sad or miserable. 0    I have been so unhappy that I have been crying. 0    The thought of harming myself has occurred to me. 0    Edinburgh Postnatal Depression Scale Total 5             Health Maintenance Due  Topic Date Due   COVID-19 Vaccine (1) Never done   HPV VACCINES (1 - 2-dose series) Never done   PAP-Cervical Cytology Screening  Never done   PAP SMEAR-Modifier  Never done    The following portions of the patient's history were reviewed and updated as appropriate: allergies, current medications, past family history, past medical history, past social history, past  surgical history, and problem list.  Review of Systems Pertinent items are noted in HPI.  Objective:  BP 112/68   Pulse 86   Wt 151 lb 1.3 oz (68.5 kg)   LMP 01/11/2022 (Exact Date)   Breastfeeding Yes   BMI 27.63 kg/m    General:  alert, cooperative, and no distress   Breasts:  not indicated  Lungs: clear to auscultation bilaterally  Heart:  regular rate and rhythm, S1, S2 normal, no murmur, click, rub or gallop  Abdomen: soft, non-tender; bowel sounds normal; no masses,  no organomegaly   Wound N/a  GU exam:  not indicated       Assessment:   1. Postpartum care and examination  2. Postpartum hypertension Stop BP medicine. Check in 2 weeks   Plan:   Essential components of care per ACOG recommendations:  1.  Mood and well being: Patient with negative depression screening today. Reviewed local resources for support.  - Patient tobacco use? No.   - hx of drug use? No.    2. Infant care and feeding:  -Patient currently breastmilk feeding? No.  -Social determinants of health (SDOH) reviewed in EPIC. No concerns  3. Sexuality, contraception and birth spacing - Patient does not want a pregnancy in the next year.   - Reviewed reproductive life planning.  Reviewed contraceptive methods based on pt preferences and effectiveness.  Patient desired Female Condom today.   - Discussed birth spacing of 18 months  4. Sleep and fatigue -Encouraged family/partner/community support of 4 hrs of uninterrupted sleep to help with mood and fatigue  5. Physical Recovery  - Discussed patients delivery and complications. She describes her labor as good. - Patient had a Vaginal, no problems at delivery. Patient had  no  laceration. Perineal healing reviewed. Patient expressed understanding - Patient has urinary incontinence? No. - Patient is safe to resume physical and sexual activity  6.  Health Maintenance - HM due items addressed Yes - Last pap smear No results found for: "DIAGPAP" Pap  smear not done at today's visit.  -Breast Cancer screening indicated? No.   7. Chronic Disease/Pregnancy Condition follow up: Hypertension  - PCP follow up  Levie Heritage, DO Center for [facility], [facility]

## 2022-11-23 ENCOUNTER — Telehealth: Payer: Self-pay

## 2022-11-23 ENCOUNTER — Encounter: Payer: Self-pay | Admitting: General Practice

## 2022-11-23 NOTE — Telephone Encounter (Signed)
Patient called stating she has been message on my chart back and forth with Dr. Adrian Blackwater. Patient states that her bleeding is very heavy  (she is one month postpartum), but this morning has been going through a maxi pad an hour and having clots. Patient advised to seek care at emergency room since she is going through pad and hour. Patient states understanding. Armandina Stammer RN

## 2022-11-24 DIAGNOSIS — R9431 Abnormal electrocardiogram [ECG] [EKG]: Secondary | ICD-10-CM | POA: Diagnosis not present

## 2022-11-24 DIAGNOSIS — O9089 Other complications of the puerperium, not elsewhere classified: Secondary | ICD-10-CM | POA: Diagnosis not present

## 2022-11-24 DIAGNOSIS — R0602 Shortness of breath: Secondary | ICD-10-CM | POA: Diagnosis not present

## 2022-11-24 DIAGNOSIS — R079 Chest pain, unspecified: Secondary | ICD-10-CM | POA: Diagnosis not present

## 2022-11-24 DIAGNOSIS — Z8759 Personal history of other complications of pregnancy, childbirth and the puerperium: Secondary | ICD-10-CM | POA: Diagnosis not present

## 2022-11-30 ENCOUNTER — Ambulatory Visit: Payer: Medicaid Other

## 2022-11-30 VITALS — BP 102/72 | HR 81 | Wt 150.1 lb

## 2022-11-30 DIAGNOSIS — Z0130 Encounter for examination of blood pressure without abnormal findings: Secondary | ICD-10-CM

## 2022-11-30 NOTE — Progress Notes (Signed)
Subjective:  Tricia Allen is a 27 y.o. female here for BP check.   Hypertension ROS: no TIA's, no chest pain on exertion, no dyspnea on exertion, and no swelling of ankles.    Objective:  BP 102/72   Pulse 81   Wt 150 lb 1.9 oz (68.1 kg)   Breastfeeding No   BMI 27.46 kg/m   Appearance alert, well appearing, and in no distress. General exam BP noted to be well controlled today in office.    Assessment:   Blood Pressure well controlled.   Plan:  Current treatment plan is effective, no change in therapy.  Miyana Mordecai l Lenore Moyano, CMA

## 2022-12-04 DIAGNOSIS — Z419 Encounter for procedure for purposes other than remedying health state, unspecified: Secondary | ICD-10-CM | POA: Diagnosis not present

## 2022-12-06 ENCOUNTER — Ambulatory Visit (INDEPENDENT_AMBULATORY_CARE_PROVIDER_SITE_OTHER): Payer: Medicaid Other

## 2022-12-06 VITALS — BP 113/82 | HR 109

## 2022-12-06 DIAGNOSIS — R300 Dysuria: Secondary | ICD-10-CM | POA: Diagnosis not present

## 2022-12-06 LAB — POCT URINALYSIS DIPSTICK
Glucose, UA: NEGATIVE
Nitrite, UA: NEGATIVE
Protein, UA: NEGATIVE
Spec Grav, UA: 1.015 (ref 1.010–1.025)
Urobilinogen, UA: 0.2 E.U./dL
pH, UA: 6 (ref 5.0–8.0)

## 2022-12-06 NOTE — Progress Notes (Signed)
Patient complaining of dysuria and malodorous urine for two days. Patient made aware we will send off for urine culture and to increase water intake and cranberry juice.   Patient unable to use AZO products.   Armandina Stammer RN

## 2022-12-08 LAB — URINE CULTURE: Organism ID, Bacteria: NO GROWTH

## 2022-12-14 ENCOUNTER — Encounter: Payer: Self-pay | Admitting: Family Medicine

## 2022-12-14 ENCOUNTER — Ambulatory Visit: Payer: Medicaid Other | Attending: Cardiology | Admitting: Cardiology

## 2022-12-14 VITALS — BP 126/76 | Ht 62.0 in | Wt 148.0 lb

## 2022-12-14 DIAGNOSIS — Z8759 Personal history of other complications of pregnancy, childbirth and the puerperium: Secondary | ICD-10-CM | POA: Diagnosis not present

## 2022-12-14 DIAGNOSIS — R002 Palpitations: Secondary | ICD-10-CM

## 2022-12-14 DIAGNOSIS — O165 Unspecified maternal hypertension, complicating the puerperium: Secondary | ICD-10-CM

## 2022-12-14 NOTE — Progress Notes (Signed)
Cardio-Obstetrics Clinic  Follow Up Note   Date:  12/14/2022   ID:  Tricia Allen, DOB Jun 28, 1995, MRN [id number]  PCP:  Hermina Staggers, MD   [facility] Providers Cardiologist:  Thomasene Ripple, DO  Electrophysiologist:  None        Referring MD: [facility]*   Chief Complaint: " I am still experiencing palpitations"  She is at home, I am in the office  Virtual Visit via Video  Note . I connected with the patient today by a   video enabled telemedicine application and verified that I am speaking with the correct person using two identifiers.   History of Present Illness:    Tricia Allen is a 27 y.o. female [G2P2002] who returns for follow up of postpartum cardiovascular care.  Medical history includes preeclampsia, anxiety and recent postpartum hypertension was on enalapril and tells me this has been titrated off.  She is no longer on any antihypertensive medication.  But she reports that she is experiencing.  I saw the patient back in April at that time she was experiencing palpitations we will place a monitor on the patient which did not show any evidence of arrhythmia.  She had had previous echo which was also normal.  Since I saw the patient she has delivered she also has battled postpartum hypertension which was treated by her OB team.  Prior CV Studies Reviewed: The following studies were reviewed today: ZIO monitor and echo reviewed  Past Medical History:  Diagnosis Date   Anxiety    Left ovarian cyst     Past Surgical History:  Procedure Laterality Date   TONSILLECTOMY AND ADENOIDECTOMY        OB History     Gravida  2   Para  2   Term  2   Preterm      AB      Living  2      SAB      IAB      Ectopic      Multiple  0   Live Births  2               Current Medications: No outpatient medications have been marked as taking for the 12/14/22 encounter (Video Visit) with Ellias Mcelreath, DO.      Allergies:   Codeine   Social History   Socioeconomic History   Marital status: Single    Spouse name: Not on file   Number of children: Not on file   Years of education: Not on file   Highest education level: Not on file  Occupational History   Not on file  Tobacco Use   Smoking status: Never   Smokeless tobacco: Never  Vaping Use   Vaping status: Never Used  Substance and Sexual Activity   Alcohol use: No   Drug use: No   Sexual activity: Not Currently    Birth control/protection: None  Other Topics Concern   Not on file  Social History Narrative   Not on file   Social Determinants of Health   Financial Resource Strain: Not on file  Food Insecurity: No Food Insecurity (10/16/2022)   Hunger Vital Sign    Worried About Running Out of Food in the Last Year: Never true    Ran Out of Food in the Last Year: Never true  Transportation Needs: No Transportation Needs (10/16/2022)   PRAPARE - Administrator, Civil Service (Medical): No  Lack of Transportation (Non-Medical): No  Physical Activity: Not on file  Stress: Not on file  Social Connections: Not on file      Family History  Problem Relation Age of Onset   Healthy Mother    Asthma Father    Hypertension Father    Heart disease Paternal Grandmother    Cancer Paternal Grandmother    Diabetes Paternal Grandfather       ROS:   Please see the history of present illness.    palpitations All other systems reviewed and are negative.   Labs/EKG Reviewed:    EKG:   EKG was not  ordered today.    Recent Labs: 08/03/2022: TSH 1.230 08/28/2022: B Natriuretic Peptide 49.9 10/22/2022: ALT 30; Hemoglobin 11.4; Platelets 369 11/08/2022: BUN 9; Creatinine, Ser 0.71; Magnesium 1.8; Potassium 4.7; Sodium 139   Recent Lipid Panel No results found for: "CHOL", "TRIG", "HDL", "CHOLHDL", "LDLCALC", "LDLDIRECT"  Physical Exam:    VS:  BP 126/76   Ht 5\' 2"  (1.575 m)   Wt 148 lb (67.1 kg)   BMI 27.07 kg/m      Wt Readings from Last 3 Encounters:  12/14/22 148 lb (67.1 kg)  11/30/22 150 lb 1.9 oz (68.1 kg)  11/16/22 151 lb 1.3 oz (68.5 kg)      Risk Assessment/Risk Calculators:                 ASSESSMENT & PLAN:    Postpartum hypertension-this has resolved she is no longer on antihypertensive blood pressure today is at target.  Palpitation-ZIO monitor did not show any evidence of arrhythmia.  She tells that she still experiencing significant palpitation.  I discussed with the patient to do a trial of propranolol to see if this is going to help but she has declined at this time.  Will continue to monitor. Patient Instructions  Medication Instructions:  Your physician recommends that you continue on your current medications as directed. Please refer to the Current Medication list given to you today.  *If you need a refill on your cardiac medications before your next appointment, please call your pharmacy*   Lab Work: None   Testing/Procedures: None   Follow-Up: At [facility], you and your health needs are our priority.  As part of our continuing mission to provide you with exceptional heart care, we have created designated Provider Care Teams.  These Care Teams include your primary Cardiologist (physician) and Advanced Practice Providers (APPs -  Physician Assistants and Nurse Practitioners) who all work together to provide you with the care you need, when you need it.   Your next appointment:   4-6 month(s)  Provider:   Thomasene Ripple, DO    Dispo:  No follow-ups on file.   Medication Adjustments/Labs and Tests Ordered: Current medicines are reviewed at length with the patient today.  Concerns regarding medicines are outlined above.  Tests Ordered: No orders of the defined types were placed in this encounter.  Medication Changes: No orders of the defined types were placed in this encounter.

## 2022-12-14 NOTE — Patient Instructions (Signed)
Medication Instructions:  Your physician recommends that you continue on your current medications as directed. Please refer to the Current Medication list given to you today.  *If you need a refill on your cardiac medications before your next appointment, please call your pharmacy*   Lab Work: None   Testing/Procedures: None   Follow-Up: At [facility], you and your health needs are our priority.  As part of our continuing mission to provide you with exceptional heart care, we have created designated Provider Care Teams.  These Care Teams include your primary Cardiologist (physician) and Advanced Practice Providers (APPs -  Physician Assistants and Nurse Practitioners) who all work together to provide you with the care you need, when you need it.    Your next appointment:   4-6 month(s)  Provider:   Kardie Tobb, DO  

## 2023-01-01 DIAGNOSIS — R519 Headache, unspecified: Secondary | ICD-10-CM | POA: Diagnosis not present

## 2023-01-01 DIAGNOSIS — T59891A Toxic effect of other specified gases, fumes and vapors, accidental (unintentional), initial encounter: Secondary | ICD-10-CM | POA: Diagnosis not present

## 2023-01-01 DIAGNOSIS — R42 Dizziness and giddiness: Secondary | ICD-10-CM | POA: Diagnosis not present

## 2023-01-02 DIAGNOSIS — T1490XA Injury, unspecified, initial encounter: Secondary | ICD-10-CM | POA: Diagnosis not present

## 2023-01-02 DIAGNOSIS — T59893A Toxic effect of other specified gases, fumes and vapors, assault, initial encounter: Secondary | ICD-10-CM | POA: Diagnosis not present

## 2023-01-02 DIAGNOSIS — D649 Anemia, unspecified: Secondary | ICD-10-CM | POA: Diagnosis not present

## 2023-01-02 DIAGNOSIS — R079 Chest pain, unspecified: Secondary | ICD-10-CM | POA: Diagnosis not present

## 2023-01-02 DIAGNOSIS — M940 Chondrocostal junction syndrome [Tietze]: Secondary | ICD-10-CM | POA: Diagnosis not present

## 2023-01-02 DIAGNOSIS — R0600 Dyspnea, unspecified: Secondary | ICD-10-CM | POA: Diagnosis not present

## 2023-01-02 DIAGNOSIS — D519 Vitamin B12 deficiency anemia, unspecified: Secondary | ICD-10-CM | POA: Diagnosis not present

## 2023-01-04 DIAGNOSIS — Z419 Encounter for procedure for purposes other than remedying health state, unspecified: Secondary | ICD-10-CM | POA: Diagnosis not present

## 2023-02-04 DIAGNOSIS — Z419 Encounter for procedure for purposes other than remedying health state, unspecified: Secondary | ICD-10-CM | POA: Diagnosis not present

## 2023-02-23 DIAGNOSIS — R42 Dizziness and giddiness: Secondary | ICD-10-CM | POA: Diagnosis not present

## 2023-02-23 DIAGNOSIS — R531 Weakness: Secondary | ICD-10-CM | POA: Diagnosis not present

## 2023-02-24 ENCOUNTER — Encounter: Payer: Self-pay | Admitting: Family Medicine

## 2023-02-25 DIAGNOSIS — R000 Tachycardia, unspecified: Secondary | ICD-10-CM | POA: Diagnosis not present

## 2023-02-25 DIAGNOSIS — R079 Chest pain, unspecified: Secondary | ICD-10-CM | POA: Diagnosis not present

## 2023-02-25 DIAGNOSIS — R0609 Other forms of dyspnea: Secondary | ICD-10-CM | POA: Diagnosis not present

## 2023-02-25 DIAGNOSIS — R0602 Shortness of breath: Secondary | ICD-10-CM | POA: Diagnosis not present

## 2023-02-25 DIAGNOSIS — R42 Dizziness and giddiness: Secondary | ICD-10-CM | POA: Diagnosis not present

## 2023-02-25 DIAGNOSIS — R519 Headache, unspecified: Secondary | ICD-10-CM | POA: Diagnosis not present

## 2023-02-25 DIAGNOSIS — R0902 Hypoxemia: Secondary | ICD-10-CM | POA: Diagnosis not present

## 2023-03-06 DIAGNOSIS — Z419 Encounter for procedure for purposes other than remedying health state, unspecified: Secondary | ICD-10-CM | POA: Diagnosis not present

## 2023-03-20 ENCOUNTER — Other Ambulatory Visit: Payer: Self-pay

## 2023-03-20 ENCOUNTER — Encounter (HOSPITAL_BASED_OUTPATIENT_CLINIC_OR_DEPARTMENT_OTHER): Payer: Self-pay | Admitting: Emergency Medicine

## 2023-03-20 ENCOUNTER — Emergency Department (HOSPITAL_BASED_OUTPATIENT_CLINIC_OR_DEPARTMENT_OTHER)
Admission: EM | Admit: 2023-03-20 | Discharge: 2023-03-20 | Disposition: A | Discharge status: Home / Self Care | Payer: Medicaid Other | Attending: Emergency Medicine | Admitting: Emergency Medicine

## 2023-03-20 ENCOUNTER — Emergency Department (HOSPITAL_BASED_OUTPATIENT_CLINIC_OR_DEPARTMENT_OTHER): Payer: Medicaid Other

## 2023-03-20 DIAGNOSIS — R079 Chest pain, unspecified: Secondary | ICD-10-CM | POA: Insufficient documentation

## 2023-03-20 DIAGNOSIS — R202 Paresthesia of skin: Secondary | ICD-10-CM | POA: Diagnosis not present

## 2023-03-20 DIAGNOSIS — R0789 Other chest pain: Secondary | ICD-10-CM | POA: Diagnosis not present

## 2023-03-20 LAB — CBC WITH DIFFERENTIAL/PLATELET
Abs Immature Granulocytes: 0.01 10*3/uL (ref 0.00–0.07)
Basophils Absolute: 0 10*3/uL (ref 0.0–0.1)
Basophils Relative: 0 %
Eosinophils Absolute: 0 10*3/uL (ref 0.0–0.5)
Eosinophils Relative: 1 %
HCT: 36.6 % (ref 36.0–46.0)
Hemoglobin: 12.3 g/dL (ref 12.0–15.0)
Immature Granulocytes: 0 %
Lymphocytes Relative: 27 %
Lymphs Abs: 1 10*3/uL (ref 0.7–4.0)
MCH: 30.3 pg (ref 26.0–34.0)
MCHC: 33.6 g/dL (ref 30.0–36.0)
MCV: 90.1 fL (ref 80.0–100.0)
Monocytes Absolute: 0.2 10*3/uL (ref 0.1–1.0)
Monocytes Relative: 6 %
Neutro Abs: 2.5 10*3/uL (ref 1.7–7.7)
Neutrophils Relative %: 66 %
Platelets: 266 10*3/uL (ref 150–400)
RBC: 4.06 MIL/uL (ref 3.87–5.11)
RDW: 12.5 % (ref 11.5–15.5)
WBC: 3.8 10*3/uL — ABNORMAL LOW (ref 4.0–10.5)
nRBC: 0 % (ref 0.0–0.2)

## 2023-03-20 LAB — TROPONIN I (HIGH SENSITIVITY): Troponin I (High Sensitivity): 2 ng/L (ref <18)

## 2023-03-20 LAB — BASIC METABOLIC PANEL
Anion gap: 12 (ref 5–15)
BUN: 9 mg/dL (ref 6–20)
CO2: 25 mmol/L (ref 22–32)
Calcium: 9.5 mg/dL (ref 8.9–10.3)
Chloride: 101 mmol/L (ref 98–111)
Creatinine, Ser: 0.68 mg/dL (ref 0.44–1.00)
GFR, Estimated: >60 mL/min (ref >60)
Glucose, Bld: 102 mg/dL — ABNORMAL HIGH (ref 70–99)
Potassium: 3.5 mmol/L (ref 3.5–5.1)
Sodium: 138 mmol/L (ref 135–145)

## 2023-03-20 LAB — D-DIMER, QUANTITATIVE: D-Dimer, Quant: 0.39 ug{FEU}/mL (ref 0.00–0.50)

## 2023-03-20 NOTE — ED Triage Notes (Signed)
Upper and across  chest tightness , right arm tingling and pain . Left arm and bilateral tingling and pain .  Had similar symptoms x 5 months since she had her baby ,  Denies Hx anxiety .  No obvious distress.  No cough or shortness of breath .

## 2023-03-20 NOTE — ED Notes (Signed)
D/c paperwork reviewed with pt, including follow up care.  No questions or concerns voiced at time of d/c. Marland Kitchen Pt verbalized understanding, Ambulatory without assistance to ED exit, NAD.

## 2023-03-20 NOTE — ED Provider Notes (Signed)
[place] [place] [facility] Provider Note   CSN: [id number] Arrival date & time: 03/20/23  7829     History  Chief Complaint  Patient presents with   Chest Pain    Tricia Allen is a 27 y.o. female.  Patient here with some chest discomfort, she had episode yesterday for a few minutes where her right hand spasms.  She denies any cough or shortness of breath.  Episode happened mostly yesterday.  She is having some tingling throughout.  She denies being anxious.  Is postpartum about 5 to 6 months.  She is not on any birth control.  She is not having any weakness numbness tingling now.  Still Tricia Allen with some chest discomfort and palpitations at times.  Denies any obvious stressors.  No significant cardiac history in family.  No blood clot history in the family.  She felt some discomfort mostly from her right elbow down to causing spasms in her right hand that lasted 5 minutes and resolved.  The history is provided by the patient.       Home Medications Prior to Admission medications   Not on File      Allergies    Codeine    Review of Systems   Review of Systems  Physical Exam Updated Vital Signs BP 115/81   Pulse 81   Temp 98.3 F (36.8 C)   Resp 17   Wt 64.9 kg   LMP 03/16/2023 (Exact Date)   SpO2 100%   BMI 26.16 kg/m  Physical Exam Vitals and nursing note reviewed.  Constitutional:      General: She is not in acute distress.    Appearance: She is well-developed. She is not ill-appearing.  HENT:     Head: Normocephalic and atraumatic.  Eyes:     Extraocular Movements: Extraocular movements intact.     Conjunctiva/sclera: Conjunctivae normal.     Pupils: Pupils are equal, round, and reactive to light.  Cardiovascular:     Rate and Rhythm: Normal rate and regular rhythm.     Pulses:          Radial pulses are 2+ on the right side and 2+ on the left side.     Heart sounds: Normal heart sounds. No murmur  heard. Pulmonary:     Effort: Pulmonary effort is normal. No respiratory distress.     Breath sounds: Normal breath sounds.  Abdominal:     Palpations: Abdomen is soft.     Tenderness: There is no abdominal tenderness.  Musculoskeletal:        General: No swelling. Normal range of motion.     Cervical back: Normal range of motion and neck supple.     Right lower leg: No edema.     Left lower leg: No edema.     Comments: No midline spinal tenderness of the CTL spine, no tenderness to the trapezius areas, little bit of tenderness to the right forearm  Skin:    General: Skin is warm and dry.     Capillary Refill: Capillary refill takes less than 2 seconds.  Neurological:     General: No focal deficit present.     Mental Status: She is alert and oriented to person, place, and time.     Cranial Nerves: No cranial nerve deficit.     Motor: No weakness.     Comments: 5+ out of 5 strength throughout, normal sensation, normal speech  Psychiatric:  Mood and Affect: Mood normal.     ED Results / Procedures / Treatments   Labs (all labs ordered are listed, but only abnormal results are displayed) Labs Reviewed  CBC WITH DIFFERENTIAL/PLATELET - Abnormal; Notable for the following components:      Result Value   WBC 3.8 (*)    All other components within normal limits  BASIC METABOLIC PANEL - Abnormal; Notable for the following components:   Glucose, Bld 102 (*)    All other components within normal limits  D-DIMER, QUANTITATIVE  TROPONIN I (HIGH SENSITIVITY)    EKG EKG Interpretation Date/Time:  Tuesday March 20 2023 09:31:55 EDT Ventricular Rate:  81 PR Interval:  153 QRS Duration:  86 QT Interval:  346 QTC Calculation: 402 R Axis:   65  Text Interpretation: Sinus rhythm Confirmed by Virgina Norfolk [phone]) on 03/20/2023 9:33:10 AM  Radiology DG Chest Portable 1 View  Result Date: 03/20/2023 CLINICAL DATA:  Chest pain and tightness, arm paresthesias EXAM: PORTABLE  CHEST 1 VIEW COMPARISON:  10/21/2022 FINDINGS: The heart size and mediastinal contours are within normal limits. Both lungs are clear. The visualized skeletal structures are unremarkable. IMPRESSION: No active disease. Electronically Signed   By: Judie Petit.  Shick M.D.   On: 03/20/2023 11:10    Procedures Procedures    Medications Ordered in ED Medications - No data to display  ED Course/ Medical Decision Making/ A&P                                 Medical Decision Making Amount and/or Complexity of Data Reviewed Labs: ordered. Radiology: ordered.   [name] is here with chest pain.  Normal vitals.  No fever.  Well-appearing.  She had some spasms in her right hand yesterday for a few minutes that resolved.  Has been having some pain from the right elbow down at times.  No neck pain.  No numbness or weakness currently.  No chest pain now.  Denies any specific stressors and anxiety.  She is about 5 months postpartum.  Overall she has no cardiac risk factors but will get troponin and EKG and basic labs.  Differential diagnosis likely stress related or musculoskeletal related but will evaluate for ACS PE infectious process electrolyte abnormalities.  I do not think she has a cervical radiculopathy.  This could be a peripheral nerve issue in the right elbow down she got a little bit of discomfort in the right forearm but there is normal strength and sensation throughout.  She is neurovascularly intact as well.  EKG shows sinus rhythm.  No ischemic changes.  Per my review patient labs no significant anemia electrolyte abnormality kidney injury or leukocytosis.  No electrolyte abnormality.  Chest x-ray with no evidence of pneumonia or pneumothorax per my review and interpretation.  EKG shows sinus rhythm.  No ischemic changes.  Troponin normal.  D-dimer normal.  Have no concern for ACS or PE.  Suspect muscular process or stress related process.  Will have her follow-up with primary care doctor.   Discharged in good condition.  Understands return precautions.  This chart was dictated using voice recognition software.  Despite best efforts to proofread,  errors can occur which can change the documentation meaning.         Final Clinical Impression(s) / ED Diagnoses Final diagnoses:  Nonspecific chest pain    Rx / DC Orders ED Discharge Orders  None         Virgina Norfolk, DO 03/20/23 1136

## 2023-03-23 DIAGNOSIS — F410 Panic disorder [episodic paroxysmal anxiety] without agoraphobia: Secondary | ICD-10-CM | POA: Diagnosis not present

## 2023-03-23 DIAGNOSIS — F411 Generalized anxiety disorder: Secondary | ICD-10-CM | POA: Diagnosis not present

## 2023-03-23 DIAGNOSIS — E2740 Unspecified adrenocortical insufficiency: Secondary | ICD-10-CM | POA: Diagnosis not present

## 2023-03-23 DIAGNOSIS — D509 Iron deficiency anemia, unspecified: Secondary | ICD-10-CM | POA: Diagnosis not present

## 2023-03-23 DIAGNOSIS — D519 Vitamin B12 deficiency anemia, unspecified: Secondary | ICD-10-CM | POA: Diagnosis not present

## 2023-04-06 DIAGNOSIS — Z419 Encounter for procedure for purposes other than remedying health state, unspecified: Secondary | ICD-10-CM | POA: Diagnosis not present

## 2023-04-09 ENCOUNTER — Other Ambulatory Visit: Payer: Self-pay | Admitting: Family Medicine

## 2023-04-09 DIAGNOSIS — M5416 Radiculopathy, lumbar region: Secondary | ICD-10-CM | POA: Diagnosis not present

## 2023-04-09 DIAGNOSIS — N83201 Unspecified ovarian cyst, right side: Secondary | ICD-10-CM | POA: Diagnosis not present

## 2023-04-09 DIAGNOSIS — R1031 Right lower quadrant pain: Secondary | ICD-10-CM | POA: Diagnosis not present

## 2023-04-09 DIAGNOSIS — R102 Pelvic and perineal pain: Secondary | ICD-10-CM

## 2023-04-09 NOTE — Progress Notes (Signed)
Received call from patient - having a lot of pelvic pain. Will order [place] to r/o ovarian cyst.

## 2023-04-12 ENCOUNTER — Ambulatory Visit (INDEPENDENT_AMBULATORY_CARE_PROVIDER_SITE_OTHER): Payer: Medicaid Other | Admitting: Family Medicine

## 2023-04-12 ENCOUNTER — Encounter: Payer: Self-pay | Admitting: Family Medicine

## 2023-04-12 ENCOUNTER — Ambulatory Visit (HOSPITAL_BASED_OUTPATIENT_CLINIC_OR_DEPARTMENT_OTHER)
Admission: RE | Admit: 2023-04-12 | Discharge: 2023-04-12 | Disposition: A | Discharge status: Home / Self Care | Payer: Medicaid Other | Source: Ambulatory Visit | Attending: Family Medicine | Admitting: Family Medicine

## 2023-04-12 VITALS — BP 122/80 | HR 80 | Temp 97.8°F | Ht 62.0 in | Wt 140.6 lb

## 2023-04-12 DIAGNOSIS — R200 Anesthesia of skin: Secondary | ICD-10-CM

## 2023-04-12 DIAGNOSIS — M791 Myalgia, unspecified site: Secondary | ICD-10-CM

## 2023-04-12 DIAGNOSIS — F411 Generalized anxiety disorder: Secondary | ICD-10-CM

## 2023-04-12 DIAGNOSIS — F321 Major depressive disorder, single episode, moderate: Secondary | ICD-10-CM

## 2023-04-12 DIAGNOSIS — R202 Paresthesia of skin: Secondary | ICD-10-CM | POA: Diagnosis not present

## 2023-04-12 DIAGNOSIS — M25562 Pain in left knee: Secondary | ICD-10-CM

## 2023-04-12 DIAGNOSIS — R102 Pelvic and perineal pain: Secondary | ICD-10-CM | POA: Insufficient documentation

## 2023-04-12 DIAGNOSIS — F32A Depression, unspecified: Secondary | ICD-10-CM | POA: Diagnosis not present

## 2023-04-12 DIAGNOSIS — R5383 Other fatigue: Secondary | ICD-10-CM

## 2023-04-12 HISTORY — DX: Depression, unspecified: F32.A

## 2023-04-12 NOTE — Assessment & Plan Note (Signed)
Labs drawn Await labs/testing for assessment and recommendations.

## 2023-04-12 NOTE — Progress Notes (Signed)
Subjective:  Patient ID: Tricia Allen, female    DOB: [birth year]  Age: 27 y.o. MRN: [id number]  Chief Complaint  Patient presents with   Establish Care    HPI Tricia Allen is a 27 yo WF that presents today to establish care.  She is by herself and states her mother had to bring her due to the fact that she was so dizzy that she did not feel comfortable driving.  She reports dizziness and spinning for about a month.  She says this occurs every day usually when walking.  Patient reports during her last pregnancy she saw cardiologist who did a full workup and will she wore a Holter monitor and everything was normal.  She followed up last July of this year and her cardiologist said everything was fine.  Patient reports having a lot of new symptoms that she did not have back in July.  She states that they did think it might be POTS but was never confirmed.  Patient denies chest pain or shortness of breath but has felt palpitations dizziness headaches lightheadedness and weakness.  Patient also reports that she is having ringing in her ears. Patient also reports numbness in her hands and even had contracture of the right hand where she could not move it.  She states that she feels anxious and has reported several panic attacks in the last month.  Prior to the birth of her 48-year-old the patient was on several psych meds including Abilify, Lexapro and amitriptyline.  [name] reports she came off of all of her medicines when she found out she was pregnant.  Patient was in tears while describing her experience with her previous doctor.  She feels like she does not want someone to prescribe her medicine without being able to explain why she is having the symptoms she is having.  Patient also reports knee pain and muscle aches down her left leg for 1.5 weeks and has not taken any medication.  Patient feels fatigued and tired recently and she feels like she is falling apart ever since she had her  children.  Patient has an appointment later on today with her OB/GYN, Vanessa Kick, MD, to do a vaginal ultrasound.  Patient denies wanting to hurt herself or others and denies suicidal ideations as well.      04/12/2023    3:48 PM 03/27/2022    9:26 AM  Depression screen PHQ 2/9  Decreased Interest 2 0  Down, Depressed, Hopeless 2 0  PHQ - 2 Score 4 0  Altered sleeping 1 0  Tired, decreased energy 2 2  Change in appetite 3 0  Feeling bad or failure about yourself  0 0  Trouble concentrating 3 0  Moving slowly or fidgety/restless 0 0  Suicidal thoughts 0 0  PHQ-9 Score 13 2  Difficult doing work/chores Very difficult         04/12/2023    3:49 PM 03/27/2022    9:27 AM  GAD 7 : Generalized Anxiety Score  Nervous, Anxious, on Edge 3 0  Control/stop worrying 3 0  Worry too much - different things 3 0  Trouble relaxing 3 0  Restless 0 0  Easily annoyed or irritable 3 1  Afraid - awful might happen 2 0  Total GAD 7 Score 17 1  Anxiety Difficulty Extremely difficult         12/26/2016    2:33 PM  Fall Risk   Falls in the past year? No  Patient Care Team: Renne Crigler, FNP as PCP - General (Family Medicine) Lorriane Shire, MD as PCP - OBGYN (Obstetrics and Gynecology) Thomasene Ripple, DO as PCP - Cardiology (Cardiology)   Review of Systems  Constitutional:  Positive for fatigue. Negative for chills, diaphoresis and fever.  HENT:  Positive for tinnitus. Negative for congestion, ear pain, sinus pain and sore throat.   Eyes:  Positive for pain (dry).  Respiratory:  Negative for cough and shortness of breath.   Cardiovascular:  Positive for palpitations. Negative for chest pain.  Gastrointestinal:  Positive for abdominal pain (lower right). Negative for constipation, nausea and vomiting.  Endocrine: Negative for polydipsia and polyuria.  Genitourinary:  Negative for dysuria, frequency and urgency.  Musculoskeletal:  Positive for arthralgias (knee pain) and  myalgias (left arm).       Numbness and tingling both hands Right hand contracted   Skin:  Positive for rash (unexplained rash that went away 2 days later).  Neurological:  Positive for dizziness, weakness, light-headedness, numbness (hands) and headaches (left side).  Psychiatric/Behavioral:  Positive for dysphoric mood. Negative for sleep disturbance. The patient is nervous/anxious.     No current outpatient medications on file prior to visit.   No current facility-administered medications on file prior to visit.   Past Medical History:  Diagnosis Date   Anxiety    Fatigue due to depression 04/12/2023   Left ovarian cyst    Past Surgical History:  Procedure Laterality Date   TONSILLECTOMY AND ADENOIDECTOMY      Family History  Problem Relation Age of Onset   Healthy Mother    Asthma Father    Hypertension Father    Heart disease Paternal Grandmother    Cancer Paternal Grandmother    Diabetes Paternal Grandfather    Social History   Socioeconomic History   Marital status: Single    Spouse name: Not on file   Number of children: Not on file   Years of education: Not on file   Highest education level: Not on file  Occupational History   Not on file  Tobacco Use   Smoking status: Never   Smokeless tobacco: Never  Vaping Use   Vaping status: Never Used  Substance and Sexual Activity   Alcohol use: No   Drug use: No   Sexual activity: Not Currently    Birth control/protection: None  Other Topics Concern   Not on file  Social History Narrative   Not on file   Social Determinants of Health   Financial Resource Strain: Not on file  Food Insecurity: No Food Insecurity (10/16/2022)   Hunger Vital Sign    Worried About Running Out of Food in the Last Year: Never true    Ran Out of Food in the Last Year: Never true  Transportation Needs: No Transportation Needs (10/16/2022)   PRAPARE - Administrator, Civil Service (Medical): No    Lack of  Transportation (Non-Medical): No  Physical Activity: Inactive (04/12/2023)   Exercise Vital Sign    Days of Exercise per Week: 0 days    Minutes of Exercise per Session: 0 min  Stress: Not on file  Social Connections: Not on file    Objective:  BP 122/80   Pulse 80   Temp 97.8 F (36.6 C)   Ht 5\' 2"  (1.575 m)   Wt 140 lb 9.6 oz (63.8 kg)   LMP 03/16/2023 (Exact Date)   SpO2 97%   BMI 25.72 kg/m  04/12/2023    3:40 PM 03/20/2023   11:25 AM 03/20/2023    9:08 AM  BP/Weight  Systolic BP 122 115 131  Diastolic BP 80 81 88  Wt. (Lbs) 140.6    BMI 25.72 kg/m2      Physical Exam Constitutional:      General: She is not in acute distress.    Appearance: Normal appearance. She is not ill-appearing.  HENT:     Head: Normocephalic.     Right Ear: Tympanic membrane normal.     Left Ear: Tympanic membrane normal.     Ears:     Comments: Dix-Hallpike maneuver  - negative Eyes:     Conjunctiva/sclera: Conjunctivae normal.  Neck:     Vascular: No carotid bruit.  Cardiovascular:     Rate and Rhythm: Normal rate and regular rhythm.     Heart sounds: Normal heart sounds.  Pulmonary:     Effort: Pulmonary effort is normal.     Breath sounds: Normal breath sounds. No wheezing.  Abdominal:     General: Bowel sounds are normal.     Palpations: Abdomen is soft.  Musculoskeletal:        General: Normal range of motion.  Skin:    General: Skin is warm.  Neurological:     Mental Status: She is alert. Mental status is at baseline.  Psychiatric:        Attention and Perception: Attention normal.        Mood and Affect: Mood normal. Affect is tearful.        Speech: Speech normal.        Behavior: Behavior normal.        Thought Content: Thought content does not include suicidal plan.     Diabetic Foot Exam - Simple   No data filed      Lab Results  Component Value Date   WBC 3.8 (L) 03/20/2023   HGB 12.3 03/20/2023   HCT 36.6 03/20/2023   PLT 266 03/20/2023    GLUCOSE 102 (H) 03/20/2023   ALT 30 10/22/2022   AST 27 10/22/2022   NA 138 03/20/2023   K 3.5 03/20/2023   CL 101 03/20/2023   CREATININE 0.68 03/20/2023   BUN 9 03/20/2023   CO2 25 03/20/2023   TSH 1.230 08/03/2022      Assessment & Plan:    Numbness and tingling in both hands Assessment & Plan: Acute Labs drawn Await labs/testing for assessment and recommendations   Orders: -     B12 and Folate Panel -     Methylmalonic acid, serum  Acute pain of left knee Assessment & Plan: Acute Referral to Sports Medicine, Terrilee Files, DO Continue to use rest and elevate the affected painful area.  Apply cold compresses intermittently as needed.  As pain recedes, begin normal activities slowly as tolerated.  Call if symptoms persist.   Orders: -     Ambulatory referral to Sports Medicine  Myalgia Assessment & Plan: Labs drawn Await labs/testing for assessment and recommendations   Orders: -     Sedimentation rate -     C-reactive protein  Fatigue due to depression Assessment & Plan: Labs drawn Await labs/testing for assessment and recommendations   Orders: -     VITAMIN D 25 Hydroxy (Vit-D Deficiency, Fractures) -     TSH  GAD (generalized anxiety disorder) Assessment & Plan: Recurrent GAD 7 - 17 Referral to [facility], Yvette Rack, NP for further evaluation  Orders: -  Ambulatory referral to Psychiatry  Moderate major depression [facility]) Assessment & Plan: Recurrent,  PHQ 9 -13 Referral to [facility], Yvette Rack, NP for further evaluation Verbal contract for safety discussed  Orders: -     Ambulatory referral to Psychiatry     No orders of the defined types were placed in this encounter.   Orders Placed This Encounter  Procedures   B12 and Folate Panel   Methylmalonic acid, serum   Vitamin D, 25-hydroxy   TSH   Sedimentation Rate   C-reactive protein   Ambulatory referral to Sports Medicine   Ambulatory referral to Psychiatry      Follow-up: Return in about 1 week (around 04/19/2023) for follow-up .   Madelynn Done Smith,acting as a Neurosurgeon for Renne Crigler, FNP.,have documented all relevant documentation on the behalf of Renne Crigler, FNP,as directed by  Renne Crigler, FNP while in the presence of Renne Crigler, FNP.   An After Visit Summary was printed and given to the patient  Total time spent on today's visit was greater than 45 minutes, including both face-to-face time and nonface-to-face time personally spent on review of chart (labs and imaging), discussing labs and goals, discussing further work-up, treatment options, referrals to specialist if needed, reviewing outside records if pertinent, answering patient's questions, and coordinating care.   Lajuana Matte, FNP [facility] [phone]

## 2023-04-12 NOTE — Assessment & Plan Note (Signed)
Acute Labs drawn Await labs/testing for assessment and recommendations

## 2023-04-12 NOTE — Assessment & Plan Note (Signed)
Acute Referral to Sports Medicine, Terrilee Files, DO Continue to use rest and elevate the affected painful area.  Apply cold compresses intermittently as needed.  As pain recedes, begin normal activities slowly as tolerated.  Call if symptoms persist.

## 2023-04-12 NOTE — Assessment & Plan Note (Addendum)
Recurrent,  PHQ 9 -13 Referral to [facility], Yvette Rack, NP for further evaluation Verbal contract for safety discussed

## 2023-04-12 NOTE — Assessment & Plan Note (Addendum)
Recurrent GAD 7 - 17 Referral to [facility], Yvette Rack, NP for further evaluation

## 2023-04-13 DIAGNOSIS — F411 Generalized anxiety disorder: Secondary | ICD-10-CM | POA: Diagnosis not present

## 2023-04-16 ENCOUNTER — Encounter: Payer: Self-pay | Admitting: Family Medicine

## 2023-04-16 ENCOUNTER — Other Ambulatory Visit: Payer: Self-pay

## 2023-04-16 ENCOUNTER — Ambulatory Visit (INDEPENDENT_AMBULATORY_CARE_PROVIDER_SITE_OTHER): Payer: Medicaid Other | Admitting: Family Medicine

## 2023-04-16 VITALS — BP 106/80 | HR 101 | Ht 62.0 in | Wt 140.0 lb

## 2023-04-16 DIAGNOSIS — R42 Dizziness and giddiness: Secondary | ICD-10-CM | POA: Diagnosis not present

## 2023-04-16 DIAGNOSIS — G43E09 Chronic migraine with aura, not intractable, without status migrainosus: Secondary | ICD-10-CM | POA: Diagnosis not present

## 2023-04-16 DIAGNOSIS — M25562 Pain in left knee: Secondary | ICD-10-CM | POA: Diagnosis not present

## 2023-04-16 DIAGNOSIS — M2550 Pain in unspecified joint: Secondary | ICD-10-CM | POA: Diagnosis not present

## 2023-04-16 LAB — CBC WITH DIFFERENTIAL/PLATELET
Basophils Absolute: 0 10*3/uL (ref 0.0–0.1)
Basophils Relative: 0.2 % (ref 0.0–3.0)
Eosinophils Absolute: 0 10*3/uL (ref 0.0–0.7)
Eosinophils Relative: 0.4 % (ref 0.0–5.0)
HCT: 40.1 % (ref 36.0–46.0)
Hemoglobin: 13.6 g/dL (ref 12.0–15.0)
Lymphocytes Relative: 29 % (ref 12.0–46.0)
Lymphs Abs: 1.5 10*3/uL (ref 0.7–4.0)
MCHC: 34 g/dL (ref 30.0–36.0)
MCV: 91.2 fL (ref 78.0–100.0)
Monocytes Absolute: 0.3 10*3/uL (ref 0.1–1.0)
Monocytes Relative: 5.3 % (ref 3.0–12.0)
Neutro Abs: 3.3 10*3/uL (ref 1.4–7.7)
Neutrophils Relative %: 65.1 % (ref 43.0–77.0)
Platelets: 341 10*3/uL (ref 150.0–400.0)
RBC: 4.4 Mil/uL (ref 3.87–5.11)
RDW: 13.3 % (ref 11.5–15.5)
WBC: 5.1 10*3/uL (ref 4.0–10.5)

## 2023-04-16 LAB — COMPREHENSIVE METABOLIC PANEL
ALT: 11 U/L (ref 0–35)
AST: 12 U/L (ref 0–37)
Albumin: 5 g/dL (ref 3.5–5.2)
Alkaline Phosphatase: 42 U/L (ref 39–117)
BUN: 8 mg/dL (ref 6–23)
CO2: 27 meq/L (ref 19–32)
Calcium: 9.7 mg/dL (ref 8.4–10.5)
Chloride: 102 meq/L (ref 96–112)
Creatinine, Ser: 0.71 mg/dL (ref 0.40–1.20)
GFR: 116.87 mL/min (ref >60.00)
Glucose, Bld: 92 mg/dL (ref 70–99)
Potassium: 4.1 meq/L (ref 3.5–5.1)
Sodium: 138 meq/L (ref 135–145)
Total Bilirubin: 0.8 mg/dL (ref 0.2–1.2)
Total Protein: 7.8 g/dL (ref 6.0–8.3)

## 2023-04-16 LAB — URIC ACID: Uric Acid, Serum: 3.9 mg/dL (ref 2.4–7.0)

## 2023-04-16 LAB — VITAMIN B12: Vitamin B-12: 333 pg/mL (ref 211–911)

## 2023-04-16 LAB — FERRITIN: Ferritin: 5.4 ng/mL — ABNORMAL LOW (ref 10.0–291.0)

## 2023-04-16 LAB — TSH: TSH: 1.34 u[IU]/mL (ref 0.35–5.50)

## 2023-04-16 LAB — VITAMIN D 25 HYDROXY (VIT D DEFICIENCY, FRACTURES): VITD: 36.17 ng/mL (ref 30.00–100.00)

## 2023-04-16 LAB — SEDIMENTATION RATE: Sed Rate: 9 mm/h (ref 0–20)

## 2023-04-16 LAB — C-REACTIVE PROTEIN: CRP: <1 mg/dL (ref 0.5–20.0)

## 2023-04-16 LAB — IBC PANEL
Iron: 59 ug/dL (ref 42–145)
Saturation Ratios: 13.1 % — ABNORMAL LOW (ref 20.0–50.0)
TIBC: 449.4 ug/dL (ref 250.0–450.0)
Transferrin: 321 mg/dL (ref 212.0–360.0)

## 2023-04-16 NOTE — Progress Notes (Signed)
[facility] [address] [place] [address] Phone: [phone] Subjective:   Tricia Allen, am serving as a scribe for Dr. Antoine Allen.  I'm seeing this patient by the request  of:  Tricia Crigler, FNP  CC: Knee pain  [id number]  Tricia Allen is a 27 y.o. female coming in with complaint of L knee pain. Has been hurting for about 2 weeks. Happens at night will happen in bed. Just over patella. Sharp pain that last no more than 5 mins.  Random pains down both sides of legs. No home treatment.       Past Medical History:  Diagnosis Date   Anxiety    Fatigue due to depression 04/12/2023   Left ovarian cyst    Past Surgical History:  Procedure Laterality Date   TONSILLECTOMY AND ADENOIDECTOMY     Social History   Socioeconomic History   Marital status: Single    Spouse name: Not on file   Number of children: Not on file   Years of education: Not on file   Highest education level: Not on file  Occupational History   Not on file  Tobacco Use   Smoking status: Never   Smokeless tobacco: Never  Vaping Use   Vaping status: Never Used  Substance and Sexual Activity   Alcohol use: No   Drug use: No   Sexual activity: Not Currently    Birth control/protection: None  Other Topics Concern   Not on file  Social History Narrative   Not on file   Social Determinants of Health   Financial Resource Strain: Not on file  Food Insecurity: No Food Insecurity (10/16/2022)   Hunger Vital Sign    Worried About Running Out of Food in the Last Year: Never true    Ran Out of Food in the Last Year: Never true  Transportation Needs: No Transportation Needs (10/16/2022)   PRAPARE - Administrator, Civil Service (Medical): No    Lack of Transportation (Non-Medical): No  Physical Activity: Inactive (04/12/2023)   Exercise Vital Sign    Days of Exercise per Week: 0 days    Minutes of Exercise per Session: 0 min  Stress: Not  on file  Social Connections: Not on file   Allergies  Allergen Reactions   Codeine Other (See Comments)    Hallucinations   Family History  Problem Relation Age of Onset   Healthy Mother    Asthma Father    Hypertension Father    Heart disease Paternal Grandmother    Cancer Paternal Grandmother    Diabetes Paternal Grandfather    No current outpatient medications on file.   Reviewed prior external information including notes and imaging from  primary care provider As well as notes that were available from care everywhere and other healthcare systems.  Past medical history, social, surgical and family history all reviewed in electronic medical record.  No pertanent information unless stated regarding to the chief complaint.   Review of Systems:  No  visual changes, nausea, vomiting, diarrhea, constipation,abdominal pain, skin rash, fevers, chills, night sweats, weight loss, swollen lymph nodes, body aches, joint swelling, chest pain, shortness of breath, mood changes. POSITIVE muscle aches, headache and dizziness  Objective  Last menstrual period 03/16/2023, not currently breastfeeding.   General: No apparent distress alert and oriented x3 mood and affect normal, dressed appropriately.  HEENT: Pupils equal, extraocular movements intact patient does have some nystagmus that does  not seem to exhaust. Respiratory: Patient's speak in full sentences and does not appear short of breath  Cardiovascular: No lower extremity edema, non tender, no erythema  Left knee exam shows mild lateral tracking noted.  Mild patella crepitus noted.  Limited muscular skeletal ultrasound was performed and interpreted by Tricia Allen, M  Limited ultrasound shows a patient of some hypoechoic changes noted in the patellofemoral joint space.  Mild narrowing of the patellofemoral joint space only on the lateral compartment.  Patient's medial and lateral meniscus appear to be unremarkable   [id number]; 15  additional minutes spent for Therapeutic exercises as stated in above notes.  This included exercises focusing on stretching, strengthening, with significant focus on eccentric aspects.   Long term goals include an improvement in range of motion, strength, endurance as well as avoiding reinjury. Patient's frequency would include in 1-2 times a day, 3-5 times a week for a duration of 6-12 weeks.  Reviewed anatomy using anatomical model and how PFS occurs.  Given rehab exercises handout for VMO, hip abductors, core, entire kinetic chain including proprioception exercises.  Could benefit from PT, regular exercise, upright biking, and a PFS knee brace to assist with tracking abnormalities. Proper technique shown and discussed handout in great detail with ATC.  All questions were discussed and answered.     Impression and Recommendations:     The above documentation has been reviewed and is accurate and complete Tricia Saa, DO

## 2023-04-16 NOTE — Patient Instructions (Addendum)
Labs today Do prescribed exercises at least 3x a week Try Voltaren on knee before bed See you again in 7-8 weeks [facility] [phone] Call Today  When we receive your results we will contact you.

## 2023-04-16 NOTE — Assessment & Plan Note (Signed)
Patient continues to have dizziness as well.  6 months out at the moment.  Concerned that there is a possibility of certain things such as a Sheehan syndrome.  Laboratory workup has shown some abnormalities but nothing significant.  We discussed with patient that with her continuing to have headaches as well as dizziness like further evaluation with MRI could be done.  This will be ordered today.  Will discuss with patient about icing regimen and home exercises otherwise.  Will follow-up again in 6 to 8 weeks.

## 2023-04-16 NOTE — Assessment & Plan Note (Signed)
This seems to be mostly patellofemoral pain.  Do believe that this is secondary to patient's not being quite as active.  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  Discussed hip abductor strengthening and how this would be beneficial.  Discussed vitamin supplementations.  Worsening pain will consider formal physical therapy and injection if needed.

## 2023-04-17 ENCOUNTER — Other Ambulatory Visit: Payer: Self-pay | Admitting: Family Medicine

## 2023-04-17 DIAGNOSIS — H9313 Tinnitus, bilateral: Secondary | ICD-10-CM

## 2023-04-18 LAB — CALCIUM, IONIZED: Calcium, Ion: 5.1 mg/dL (ref 4.7–5.5)

## 2023-04-18 LAB — ANA: Anti Nuclear Antibody (ANA): NEGATIVE

## 2023-04-18 LAB — C-REACTIVE PROTEIN: CRP: <1 mg/L (ref 0–10)

## 2023-04-18 LAB — PTH, INTACT AND CALCIUM
Calcium: 10 mg/dL (ref 8.6–10.2)
PTH: 33 pg/mL (ref 16–77)

## 2023-04-18 LAB — ANGIOTENSIN CONVERTING ENZYME: Angiotensin-Converting Enzyme: 33 U/L (ref 9–67)

## 2023-04-18 LAB — CYCLIC CITRUL PEPTIDE ANTIBODY, IGG: Cyclic Citrullin Peptide Ab: <16 U

## 2023-04-18 LAB — SEDIMENTATION RATE: Sed Rate: 2 mm/h (ref 0–32)

## 2023-04-18 LAB — B12 AND FOLATE PANEL
Folate: 10.8 ng/mL (ref >3.0)
Vitamin B-12: 429 pg/mL (ref 232–1245)

## 2023-04-18 LAB — METHYLMALONIC ACID, SERUM: Methylmalonic Acid: 82 nmol/L (ref 0–378)

## 2023-04-18 LAB — TSH: TSH: 0.618 u[IU]/mL (ref 0.450–4.500)

## 2023-04-18 LAB — RHEUMATOID FACTOR: Rheumatoid fact SerPl-aCnc: <10 [IU]/mL (ref <14)

## 2023-04-18 LAB — VITAMIN D 25 HYDROXY (VIT D DEFICIENCY, FRACTURES): Vit D, 25-Hydroxy: 29.3 ng/mL — ABNORMAL LOW (ref 30.0–100.0)

## 2023-04-19 ENCOUNTER — Other Ambulatory Visit: Payer: Self-pay | Admitting: Family Medicine

## 2023-04-19 DIAGNOSIS — E559 Vitamin D deficiency, unspecified: Secondary | ICD-10-CM

## 2023-04-19 MED ORDER — VITAMIN D (ERGOCALCIFEROL) 1.25 MG (50000 UNIT) PO CAPS: 50000.0000 [IU] | ORAL_CAPSULE | ORAL | 0 refills | Status: AC

## 2023-04-20 ENCOUNTER — Ambulatory Visit: Payer: Medicaid Other | Admitting: Family Medicine

## 2023-04-20 ENCOUNTER — Ambulatory Visit (INDEPENDENT_AMBULATORY_CARE_PROVIDER_SITE_OTHER): Payer: Medicaid Other | Admitting: Family Medicine

## 2023-04-20 ENCOUNTER — Encounter: Payer: Self-pay | Admitting: Family Medicine

## 2023-04-20 VITALS — BP 122/64 | HR 72 | Temp 97.8°F | Ht 62.0 in | Wt 138.0 lb

## 2023-04-20 DIAGNOSIS — H9313 Tinnitus, bilateral: Secondary | ICD-10-CM | POA: Diagnosis not present

## 2023-04-20 DIAGNOSIS — M25562 Pain in left knee: Secondary | ICD-10-CM

## 2023-04-20 DIAGNOSIS — R42 Dizziness and giddiness: Secondary | ICD-10-CM | POA: Diagnosis not present

## 2023-04-20 DIAGNOSIS — E559 Vitamin D deficiency, unspecified: Secondary | ICD-10-CM | POA: Insufficient documentation

## 2023-04-20 DIAGNOSIS — F411 Generalized anxiety disorder: Secondary | ICD-10-CM | POA: Diagnosis not present

## 2023-04-20 NOTE — Assessment & Plan Note (Signed)
Level 29.3  - Vitamin D 50,000 units by mouth every week for 6 weeks - Fu in 6-8 weeks

## 2023-04-20 NOTE — Assessment & Plan Note (Signed)
Recurrent - waiting on call to get scheduled with [facility] -not interested in counseling -no harm contact signed at last visit -patient does not want medication at this time

## 2023-04-20 NOTE — Progress Notes (Signed)
Subjective:  Patient ID: Tricia Allen, female    DOB: [birth year]  Age: 27 y.o. MRN: [id number]  Chief Complaint  Patient presents with   Discuss lab results    HPI   Patient presents today for 1 week follow up to discuss lab work. Patient has her son with her today.   Anxiety and Depression: Patient reports anxiety and depression are the same and she does not want to take any medication at this time. She has not got a call regarding getting into Yvette Rack, NP for phychiatric referral. Patient doesn't seem interested in counseling at this time. Denies wanting to hurt herself or others and SI.  Vitamin D deficiency: Vitamin D - 29.3, Ordered Vitamin D 50,000 units by mouth once a week for 6 weeks.  Tinnitus: Worsening ringing in bilateral ears on 04/17/23. I have submitted an ENT referral for the tinnitus.   Chronic dizziness/headaches:  Dr. Katrinka Blazing was concerned that there is a possibility of certain things such as a Sheehan syndrome. Laboratory workup has shown some abnormalities but nothing significant. He has ordered an MRI for further testing..   Acute left knee pain:  She saw Dr. Katrinka Blazing on 04/16/23.  Additional lab workup has showed that her ferritin level were 5.4 and her saturation ratios were 13.1% He has asked her to take an Iron and Vitamin C supplement. He states that he thinks that her left knee pain is mostly patellofemoral pain secondary to patient's not being active. 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 Educated on abductor strengthening and how this would be beneficial. Formal physical therapy and injections as needed for worsening pain.     04/12/2023    3:48 PM 03/27/2022    9:26 AM  Depression screen PHQ 2/9  Decreased Interest 2 0  Down, Depressed, Hopeless 2 0  PHQ - 2 Score 4 0  Altered sleeping 1 0  Tired, decreased energy 2 2  Change in appetite 3 0  Feeling bad or failure about yourself  0 0  Trouble  concentrating 3 0  Moving slowly or fidgety/restless 0 0  Suicidal thoughts 0 0  PHQ-9 Score 13 2  Difficult doing work/chores Very difficult         12/26/2016    2:33 PM  Fall Risk   Falls in the past year? No    Patient Care Team: Judi Saa, DO as PCP - General (Family Medicine) Lorriane Shire, MD as PCP - OBGYN (Obstetrics and Gynecology) Thomasene Ripple, DO as PCP - Cardiology (Cardiology)   Review of Systems  Constitutional:  Negative for chills, fatigue and fever.  HENT:  Negative for congestion, ear pain, rhinorrhea and sore throat.   Respiratory:  Negative for cough and shortness of breath.   Cardiovascular:  Negative for chest pain.  Gastrointestinal:  Negative for abdominal pain, constipation, diarrhea, nausea and vomiting.  Genitourinary:  Negative for dysuria and urgency.  Musculoskeletal:  Negative for back pain and myalgias.  Skin:  Negative for rash.  Neurological:  Positive for numbness (and tingling). Negative for dizziness, weakness, light-headedness and headaches.  Psychiatric/Behavioral:  Positive for dysphoric mood. The patient is nervous/anxious.     Current Outpatient Medications on File Prior to Visit  Medication Sig Dispense Refill   Vitamin D, Ergocalciferol, (DRISDOL) 1.25 MG (50000 UNIT) CAPS capsule Take 1 capsule (50,000 Units total) by mouth every 7 (seven) days for 6 doses. 6 capsule 0  No current facility-administered medications on file prior to visit.   Past Medical History:  Diagnosis Date   Anxiety    Fatigue due to depression 04/12/2023   Left ovarian cyst    Past Surgical History:  Procedure Laterality Date   TONSILLECTOMY AND ADENOIDECTOMY      Family History  Problem Relation Age of Onset   Healthy Mother    Asthma Father    Hypertension Father    Heart disease Paternal Grandmother    Cancer Paternal Grandmother    Diabetes Paternal Grandfather    Social History   Socioeconomic History   Marital status: Single     Spouse name: Not on file   Number of children: Not on file   Years of education: Not on file   Highest education level: Not on file  Occupational History   Not on file  Tobacco Use   Smoking status: Never   Smokeless tobacco: Never  Vaping Use   Vaping status: Never Used  Substance and Sexual Activity   Alcohol use: No   Drug use: No   Sexual activity: Not Currently    Birth control/protection: None  Other Topics Concern   Not on file  Social History Narrative   Not on file   Social Determinants of Health   Financial Resource Strain: Low Risk  (04/20/2023)   Overall Financial Resource Strain (CARDIA)    Difficulty of Paying Living Expenses: Not hard at all  Food Insecurity: No Food Insecurity (10/16/2022)   Hunger Vital Sign    Worried About Running Out of Food in the Last Year: Never true    Ran Out of Food in the Last Year: Never true  Transportation Needs: No Transportation Needs (10/16/2022)   PRAPARE - Administrator, Civil Service (Medical): No    Lack of Transportation (Non-Medical): No  Physical Activity: Inactive (04/12/2023)   Exercise Vital Sign    Days of Exercise per Week: 0 days    Minutes of Exercise per Session: 0 min  Stress: No Stress Concern Present (04/20/2023)   [employer] of Occupational Health - Occupational Stress Questionnaire    Feeling of Stress : Not at all  Social Connections: Moderately Isolated (04/20/2023)   Social Connection and Isolation Panel [NHANES]    Frequency of Communication with Friends and Family: More than three times a week    Frequency of Social Gatherings with Friends and Family: More than three times a week    Attends Religious Services: Never    Database administrator or Organizations: No    Attends Engineer, structural: Never    Marital Status: Living with partner    Objective:  BP 122/64   Pulse 72   Temp 97.8 F (36.6 C)   Ht 5\' 2"  (1.575 m)   Wt 138 lb (62.6 kg)   LMP 04/13/2023    SpO2 98%   BMI 25.24 kg/m      04/20/2023    8:24 AM 04/16/2023    8:52 AM 04/12/2023    3:40 PM  BP/Weight  Systolic BP 122 106 122  Diastolic BP 64 80 80  Wt. (Lbs) 138 140 140.6  BMI 25.24 kg/m2 25.61 kg/m2 25.72 kg/m2    Physical Exam Constitutional:      General: She is not in acute distress.    Appearance: Normal appearance. She is not ill-appearing.  HENT:     Head: Normocephalic.  Cardiovascular:     Rate and Rhythm: Normal  rate and regular rhythm.     Heart sounds: Normal heart sounds.  Pulmonary:     Effort: Pulmonary effort is normal.     Breath sounds: Normal breath sounds. No wheezing.  Musculoskeletal:     Cervical back: Normal range of motion.  Skin:    General: Skin is warm.  Neurological:     Mental Status: She is alert. Mental status is at baseline.  Psychiatric:        Mood and Affect: Mood normal.        Behavior: Behavior normal.     Diabetic Foot Exam - Simple   No data filed      Lab Results  Component Value Date   WBC 5.1 04/16/2023   HGB 13.6 04/16/2023   HCT 40.1 04/16/2023   PLT 341.0 04/16/2023   GLUCOSE 92 04/16/2023   ALT 11 04/16/2023   AST 12 04/16/2023   NA 138 04/16/2023   K 4.1 04/16/2023   CL 102 04/16/2023   CREATININE 0.71 04/16/2023   BUN 8 04/16/2023   CO2 27 04/16/2023   TSH 1.34 04/16/2023      Assessment & Plan:    GAD (generalized anxiety disorder) Assessment & Plan: Recurrent - waiting on call to get scheduled with [facility] -not interested in counseling -no harm contact signed at last visit -patient does not want medication at this time    Tinnitus of both ears Assessment & Plan: Chronic - referral to ENT   Dizziness Assessment & Plan: Chronic - not improving,  -Dr. Terrilee Files has ordered an MRI - scheduled for 05/07/23 to R/O Sheehan syndrome -scheduled for follow-up on 06/01/23   Vitamin D deficiency Assessment & Plan: Level 29.3  - Vitamin D 50,000 units by mouth every week  for 6 weeks - Fu in 6-8 weeks   Acute pain of left knee Assessment & Plan: Acute - not improved - being managed by Dr. Terrilee Files - Strengthening exercises given to patient  -possible referral to PT and or injections -Ferritin levels low- Advised to take iron and vitamin C supplements      Follow-up: Return in about 6 weeks (around 06/01/2023) for Vitamin D level, lab visit.   I,Katherina A Bramblett,acting as a scribe for Renne Crigler, FNP.,have documented all relevant documentation on the behalf of Renne Crigler, FNP,as directed by  Renne Crigler, FNP while in the presence of Renne Crigler, FNP.   An After Visit Summary was printed and given to the patient.  Total time spent on today's visit was greater than 30 minutes, including both face-to-face time and nonface-to-face time personally spent on review of chart (labs and imaging), discussing labs and goals, discussing further work-up, treatment options, referrals to specialist if needed, reviewing outside records of pertinent, answering patient's questions, and coordinating care.   Lajuana Matte, FNP [facility] [phone]

## 2023-04-20 NOTE — Assessment & Plan Note (Signed)
Acute - not improved - being managed by Dr. Terrilee Files - Strengthening exercises given to patient  -possible referral to PT and or injections -Ferritin levels low- Advised to take iron and vitamin C supplements

## 2023-04-20 NOTE — Assessment & Plan Note (Signed)
Chronic - referral to ENT

## 2023-04-20 NOTE — Assessment & Plan Note (Signed)
Chronic - not improving,  -Dr. Terrilee Files has ordered an MRI - scheduled for 05/07/23 to R/O Sheehan syndrome -scheduled for follow-up on 06/01/23

## 2023-04-27 DIAGNOSIS — F411 Generalized anxiety disorder: Secondary | ICD-10-CM | POA: Diagnosis not present

## 2023-05-06 DIAGNOSIS — Z419 Encounter for procedure for purposes other than remedying health state, unspecified: Secondary | ICD-10-CM | POA: Diagnosis not present

## 2023-05-07 ENCOUNTER — Ambulatory Visit
Admission: RE | Admit: 2023-05-07 | Discharge: 2023-05-07 | Disposition: A | Discharge status: Home / Self Care | Payer: Medicaid Other | Source: Ambulatory Visit | Attending: Family Medicine | Admitting: Family Medicine

## 2023-05-07 DIAGNOSIS — G43E09 Chronic migraine with aura, not intractable, without status migrainosus: Secondary | ICD-10-CM | POA: Diagnosis not present

## 2023-05-07 MED ORDER — GADOPICLENOL 0.5 MMOL/ML IV SOLN
6.0000 mL | Freq: Once | INTRAVENOUS | Status: AC | PRN
Start: 2023-05-07 — End: 2023-05-07
  Administered 2023-05-07: 6 mL via INTRAVENOUS

## 2023-05-08 ENCOUNTER — Encounter: Payer: Self-pay | Admitting: Family Medicine

## 2023-05-11 DIAGNOSIS — F411 Generalized anxiety disorder: Secondary | ICD-10-CM | POA: Diagnosis not present

## 2023-05-14 ENCOUNTER — Other Ambulatory Visit: Payer: Self-pay | Admitting: Family Medicine

## 2023-05-14 DIAGNOSIS — R42 Dizziness and giddiness: Secondary | ICD-10-CM

## 2023-05-14 NOTE — Progress Notes (Signed)
Patient requested a referral to Neurology to discuss MRI results and dizziness.

## 2023-05-16 ENCOUNTER — Encounter: Payer: Self-pay | Admitting: Neurology

## 2023-05-25 DIAGNOSIS — F411 Generalized anxiety disorder: Secondary | ICD-10-CM | POA: Diagnosis not present

## 2023-05-28 NOTE — Progress Notes (Signed)
[facility] [address] [place] [address] Phone: [phone] Subjective:   Tricia Allen, am serving as a scribe for Dr. Antoine Primas.  I'm seeing this patient by the request  of:  Judi Saa, DO  CC: Left knee pain, back pain  [id number]  04/16/2023 Patient continues to have dizziness as well.  6 months out at the moment.  Concerned that there is a possibility of certain things such as a Sheehan syndrome.  Laboratory workup has shown some abnormalities but nothing significant.  We discussed with patient that with her continuing to have headaches as well as dizziness like further evaluation with MRI could be done.  This will be ordered today.  Will discuss with patient about icing regimen and home exercises otherwise.  Will follow-up again in 6 to 8 weeks.     This seems to be mostly patellofemoral pain.  Do believe that this is secondary to patient's not being quite as active.  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  Discussed hip abductor strengthening and how this would be beneficial.  Discussed vitamin supplementations.  Worsening pain will consider formal physical therapy and injection if needed.      Update 06/01/2023 Tricia Allen is a 27 y.o. female coming in with complaint of dizziness and L knee pain. Patient states has only had pain like once since last visit. No big changes nothing new.       Past Medical History:  Diagnosis Date   Anxiety    Fatigue due to depression 04/12/2023   Left ovarian cyst    Past Surgical History:  Procedure Laterality Date   TONSILLECTOMY AND ADENOIDECTOMY     Social History   Socioeconomic History   Marital status: Single    Spouse name: Not on file   Number of children: Not on file   Years of education: Not on file   Highest education level: Not on file  Occupational History   Not on file  Tobacco Use   Smoking status: Never   Smokeless tobacco: Never   Vaping Use   Vaping status: Never Used  Substance and Sexual Activity   Alcohol use: No   Drug use: No   Sexual activity: Not Currently    Birth control/protection: None  Other Topics Concern   Not on file  Social History Narrative   Not on file   Social Drivers of Health   Financial Resource Strain: Low Risk  (04/20/2023)   Overall Financial Resource Strain (CARDIA)    Difficulty of Paying Living Expenses: Not hard at all  Food Insecurity: No Food Insecurity (10/16/2022)   Hunger Vital Sign    Worried About Running Out of Food in the Last Year: Never true    Ran Out of Food in the Last Year: Never true  Transportation Needs: No Transportation Needs (10/16/2022)   PRAPARE - Administrator, Civil Service (Medical): No    Lack of Transportation (Non-Medical): No  Physical Activity: Inactive (04/12/2023)   Exercise Vital Sign    Days of Exercise per Week: 0 days    Minutes of Exercise per Session: 0 min  Stress: No Stress Concern Present (04/20/2023)   [employer] of Occupational Health - Occupational Stress Questionnaire    Feeling of Stress : Not at all  Social Connections: Moderately Isolated (04/20/2023)   Social Connection and Isolation Panel [NHANES]    Frequency of Communication with Friends and  Family: More than three times a week    Frequency of Social Gatherings with Friends and Family: More than three times a week    Attends Religious Services: Never    Database administrator or Organizations: No    Attends Engineer, structural: Never    Marital Status: Living with partner   Allergies  Allergen Reactions   Codeine Other (See Comments)    Hallucinations   Family History  Problem Relation Age of Onset   Healthy Mother    Asthma Father    Hypertension Father    Heart disease Paternal Grandmother    Cancer Paternal Grandmother    Diabetes Paternal Grandfather    No current outpatient medications on file.   Reviewed prior external  information including notes and imaging from  primary care provider As well as notes that were available from care everywhere and other healthcare systems.  Past medical history, social, surgical and family history all reviewed in electronic medical record.  No pertanent information unless stated regarding to the chief complaint.   Review of Systems:  No headache, visual changes, nausea, vomiting, diarrhea, constipation, dizziness, abdominal pain, skin rash, fevers, chills, night sweats, weight loss, swollen lymph nodes, body aches, joint swelling, chest pain, shortness of breath, mood changes. POSITIVE muscle aches  Objective  Blood pressure 104/70, pulse 94, height 5\' 2"  (1.575 m), weight 138 lb (62.6 kg), SpO2 98%, not currently breastfeeding.   General: No apparent distress alert and oriented x3 mood and affect normal, dressed appropriately.  HEENT: Pupils equal, extraocular movements intact  Respiratory: Patient's speak in full sentences and does not appear short of breath  Cardiovascular: No lower extremity edema, non tender, no erythema  Mild hypermobility noted at the back.  Tenderness to palpation diffusely.  Negative straight leg test.   Osteopathic findings  C4 flexed rotated and side bent right T9 extended rotated and side bent left L2 flexed rotated and side bent right Sacrum right on right    Impression and Recommendations:    Myalgia Myalgia joints noted.  Attempted osteopathic manipulation which did seem proving.  Discussed with patient that icing regimen and home exercises otherwise.  Increase activity slowly.  We discussed some strengthening exercises.  Patient was accompanied with her child today.  Follow-up again in 6 to 8 weeks otherwise     Decision today to treat with OMT was based on Physical Exam  After verbal consent patient was treated with HVLA, ME, FPR techniques in cervical, thoracic,  lumbar and sacral areas, all areas are chronic   Patient  tolerated the procedure well with improvement in symptoms  Patient given exercises, stretches and lifestyle modifications  See medications in patient instructions if given  Patient will follow up in 4-8 weeks The above documentation has been reviewed and is accurate and complete Judi Saa, DO

## 2023-06-01 ENCOUNTER — Ambulatory Visit (INDEPENDENT_AMBULATORY_CARE_PROVIDER_SITE_OTHER): Payer: Medicaid Other | Admitting: Family Medicine

## 2023-06-01 ENCOUNTER — Encounter: Payer: Self-pay | Admitting: Family Medicine

## 2023-06-01 VITALS — BP 104/70 | HR 94 | Ht 62.0 in | Wt 138.0 lb

## 2023-06-01 DIAGNOSIS — M9904 Segmental and somatic dysfunction of sacral region: Secondary | ICD-10-CM | POA: Diagnosis not present

## 2023-06-01 DIAGNOSIS — M9903 Segmental and somatic dysfunction of lumbar region: Secondary | ICD-10-CM

## 2023-06-01 DIAGNOSIS — M9902 Segmental and somatic dysfunction of thoracic region: Secondary | ICD-10-CM | POA: Diagnosis not present

## 2023-06-01 DIAGNOSIS — M9901 Segmental and somatic dysfunction of cervical region: Secondary | ICD-10-CM | POA: Diagnosis not present

## 2023-06-01 DIAGNOSIS — M791 Myalgia, unspecified site: Secondary | ICD-10-CM

## 2023-06-01 DIAGNOSIS — D649 Anemia, unspecified: Secondary | ICD-10-CM | POA: Diagnosis not present

## 2023-06-01 DIAGNOSIS — M2550 Pain in unspecified joint: Secondary | ICD-10-CM

## 2023-06-01 LAB — CBC WITH DIFFERENTIAL/PLATELET
Basophils Absolute: 0 10*3/uL (ref 0.0–0.1)
Basophils Relative: 0.2 % (ref 0.0–3.0)
Eosinophils Absolute: 0 10*3/uL (ref 0.0–0.7)
Eosinophils Relative: 0.6 % (ref 0.0–5.0)
HCT: 39.2 % (ref 36.0–46.0)
Hemoglobin: 13.2 g/dL (ref 12.0–15.0)
Lymphocytes Relative: 25.7 % (ref 12.0–46.0)
Lymphs Abs: 1.4 10*3/uL (ref 0.7–4.0)
MCHC: 33.7 g/dL (ref 30.0–36.0)
MCV: 92.4 fL (ref 78.0–100.0)
Monocytes Absolute: 0.5 10*3/uL (ref 0.1–1.0)
Monocytes Relative: 8.6 % (ref 3.0–12.0)
Neutro Abs: 3.5 10*3/uL (ref 1.4–7.7)
Neutrophils Relative %: 64.9 % (ref 43.0–77.0)
Platelets: 259 10*3/uL (ref 150.0–400.0)
RBC: 4.25 Mil/uL (ref 3.87–5.11)
RDW: 13 % (ref 11.5–15.5)
WBC: 5.4 10*3/uL (ref 4.0–10.5)

## 2023-06-01 LAB — IBC PANEL
Iron: 88 ug/dL (ref 42–145)
Saturation Ratios: 22.8 % (ref 20.0–50.0)
TIBC: 386.4 ug/dL (ref 250.0–450.0)
Transferrin: 276 mg/dL (ref 212.0–360.0)

## 2023-06-01 LAB — VITAMIN B12: Vitamin B-12: 348 pg/mL (ref 211–911)

## 2023-06-01 LAB — FERRITIN: Ferritin: 13 ng/mL (ref 10.0–291.0)

## 2023-06-01 NOTE — Assessment & Plan Note (Signed)
Repeat labs to see if patient is responding to the supplementation

## 2023-06-01 NOTE — Patient Instructions (Addendum)
Labs today See you again in 6 weeks

## 2023-06-01 NOTE — Assessment & Plan Note (Signed)
Myalgia joints noted.  Attempted osteopathic manipulation which did seem proving.  Discussed with patient that icing regimen and home exercises otherwise.  Increase activity slowly.  We discussed some strengthening exercises.  Patient was accompanied with her child today.  Follow-up again in 6 to 8 weeks otherwise

## 2023-06-04 ENCOUNTER — Ambulatory Visit (INDEPENDENT_AMBULATORY_CARE_PROVIDER_SITE_OTHER): Payer: Medicaid Other | Admitting: Audiology

## 2023-06-04 ENCOUNTER — Ambulatory Visit (INDEPENDENT_AMBULATORY_CARE_PROVIDER_SITE_OTHER): Payer: Medicaid Other

## 2023-06-04 ENCOUNTER — Encounter (INDEPENDENT_AMBULATORY_CARE_PROVIDER_SITE_OTHER): Payer: Self-pay

## 2023-06-04 VITALS — BP 106/73 | HR 89 | Ht 62.0 in | Wt 136.0 lb

## 2023-06-04 DIAGNOSIS — H93291 Other abnormal auditory perceptions, right ear: Secondary | ICD-10-CM

## 2023-06-04 DIAGNOSIS — H93293 Other abnormal auditory perceptions, bilateral: Secondary | ICD-10-CM | POA: Diagnosis not present

## 2023-06-04 DIAGNOSIS — H9313 Tinnitus, bilateral: Secondary | ICD-10-CM | POA: Diagnosis not present

## 2023-06-05 NOTE — Progress Notes (Signed)
Patient ID: Tricia Allen, female   DOB: 1995/09/08, 27 y.o.   MRN: [id number]  CC: Bilateral tinnitus  HPI:  Tricia Allen is a 27 y.o. female who presents today complaining of bilateral intermittent tinnitus for the past 2 months.  She describes the tinnitus as a high-pitched ringing noise.  She denies any otalgia, otorrhea, vertigo, or hearing difficulty.  She has no previous otitis media or otitis externa.  She underwent adenotonsillectomy surgery as a child.  She has no other ENT surgery.  Past Medical History:  Diagnosis Date   Anxiety    Fatigue due to depression 04/12/2023   Left ovarian cyst     Past Surgical History:  Procedure Laterality Date   TONSILLECTOMY AND ADENOIDECTOMY      Family History  Problem Relation Age of Onset   Healthy Mother    Asthma Father    Hypertension Father    Heart disease Paternal Grandmother    Cancer Paternal Grandmother    Diabetes Paternal Grandfather     Social History:  reports that she has never smoked. She has never used smokeless tobacco. She reports that she does not drink alcohol and does not use drugs.  Allergies:  Allergies  Allergen Reactions   Codeine Other (See Comments)    Hallucinations    Prior to Admission medications   Not on File    Blood pressure 106/73, pulse 89, height 5\' 2"  (1.575 m), weight 136 lb (61.7 kg), not currently breastfeeding. Exam: General: Communicates without difficulty, well nourished, no acute distress. Head: Normocephalic, no evidence injury, no tenderness, facial buttresses intact without stepoff. Face/sinus: No tenderness to palpation and percussion. Facial movement is normal and symmetric. Eyes: PERRL, EOMI. No scleral icterus, conjunctivae clear. Neuro: CN II exam reveals vision grossly intact.  No nystagmus at any point of gaze. Ears: Auricles well formed without lesions.  Ear canals are intact without mass or lesion.  No erythema or edema is appreciated.  The TMs are intact  without fluid. Nose: External evaluation reveals normal support and skin without lesions.  Dorsum is intact.  Anterior rhinoscopy reveals normal mucosa over anterior aspect of inferior turbinates and intact septum.  No purulence noted. Oral:  Oral cavity and oropharynx are intact, symmetric, without erythema or edema.  Mucosa is moist without lesions. Neck: Full range of motion without pain.  There is no significant lymphadenopathy.  No masses palpable.  Thyroid bed within normal limits to palpation.  Parotid glands and submandibular glands equal bilaterally without mass.  Trachea is midline. Neuro:  CN 2-12 grossly intact.   Her hearing test shows normal hearing bilaterally across all frequencies.  Assessment: 1.  Intermittent bilateral subjective tinnitus. 2.  Normal hearing bilaterally across all frequencies. 3.  The patient's ear canals, tympanic membranes, and middle ear spaces are all normal.  Plan: 1.  The physical exam findings and the hearing test results are reviewed with the patient. 2.  The strategies to cope with tinnitus, including the use of masker, hearing aids, tinnitus retraining therapy, and avoidance of caffeine and alcohol are discussed. 3.  The patient is encouraged to call with any questions or concerns.  [name] 06/05/2023, 11:55 AM

## 2023-06-06 DIAGNOSIS — Z419 Encounter for procedure for purposes other than remedying health state, unspecified: Secondary | ICD-10-CM | POA: Diagnosis not present

## 2023-06-07 ENCOUNTER — Encounter (HOSPITAL_BASED_OUTPATIENT_CLINIC_OR_DEPARTMENT_OTHER): Payer: Self-pay

## 2023-06-07 ENCOUNTER — Ambulatory Visit: Payer: Self-pay | Admitting: Family Medicine

## 2023-06-07 ENCOUNTER — Ambulatory Visit (HOSPITAL_BASED_OUTPATIENT_CLINIC_OR_DEPARTMENT_OTHER)
Admission: RE | Admit: 2023-06-07 | Discharge: 2023-06-07 | Disposition: A | Discharge status: Home / Self Care | Payer: Medicaid Other | Source: Ambulatory Visit | Attending: Family Medicine | Admitting: Family Medicine

## 2023-06-07 VITALS — BP 106/73 | HR 77 | Temp 98.3°F | Resp 18

## 2023-06-07 DIAGNOSIS — R300 Dysuria: Secondary | ICD-10-CM | POA: Insufficient documentation

## 2023-06-07 DIAGNOSIS — R1031 Right lower quadrant pain: Secondary | ICD-10-CM | POA: Diagnosis not present

## 2023-06-07 DIAGNOSIS — R310 Gross hematuria: Secondary | ICD-10-CM | POA: Insufficient documentation

## 2023-06-07 LAB — POCT URINALYSIS DIP (MANUAL ENTRY)
Bilirubin, UA: NEGATIVE
Glucose, UA: NEGATIVE mg/dL
Ketones, POC UA: NEGATIVE mg/dL
Leukocytes, UA: NEGATIVE
Nitrite, UA: NEGATIVE
Protein Ur, POC: 30 mg/dL — AB
Spec Grav, UA: >=1.03 — AB
Urobilinogen, UA: 0.2 U/dL
pH, UA: 5.5

## 2023-06-07 LAB — POCT URINE PREGNANCY: Preg Test, Ur: NEGATIVE

## 2023-06-07 NOTE — Telephone Encounter (Signed)
 Copied from CRM [id number]. Topic: Clinical - Pink Word Triage >> Jun 07, 2023  8:48 AM Renea ORN wrote: UTI Pain and Spotting.   Chief Complaint:  Blood in Urine when urinating  Symptoms:  flank pain, increase urgency and frequency   Frequency: every time she urinates Pertinent Negatives: Patient denies being pregnant. Disposition: [] ED /[x] Urgent Care (no appt availability in office) / [] Appointment(In office/virtual)/ []  [facility]/ [] Home Care/ [] Refused Recommended Disposition /[] [facility]/ []  Follow-up with PCP Additional Notes: Scheduled in [facility] urgent [facility]  Reason for Disposition  [1] Pain or burning with passing urine AND [2] side (flank) or back pain present  Answer Assessment - Initial Assessment Questions 1. COLOR of URINE: Describe the color of the urine.  (e.g., tea-colored, pink, red, bloody) Do you have blood clots in your urine? (e.g., none, pea, grape, small coin)      Red  when she wipe   every time 2. ONSET: When did the bleeding start?       3. EPISODES: How many times has there been blood in the urine? or How many times today?      Evrytime 4. PAIN with URINATION: Is there any pain with passing your urine? If Yes, ask: How bad is the pain?  (Scale 1-10; or mild, moderate, severe)    - MILD: Complains slightly about urination hurting.    - MODERATE: Interferes with normal activities.      - SEVERE: Excruciating, unwilling or unable to urinate because of the pain.      severe 5. FEVER: Do you have a fever? If Yes, ask: What is your temperature, how was it measured, and when did it start?     99.0 6. ASSOCIATED SYMPTOMS: Are you passing urine more frequently than usual?      Urination is more  frequency and increase in urgency  7. OTHER SYMPTOMS: Do you have any other symptoms? (e.g., back/flank pain, abdomen pain, vomiting)     Abodmen pain   Rates  5-6 8. PREGNANCY: Is there any chance you are pregnant? When  was your last menstrual period?      Denies - sometime last month  Protocols used: Urine - Blood In-A-AH

## 2023-06-07 NOTE — ED Provider Notes (Signed)
 [facility]    CSN: [id number] Arrival date & time: 06/07/23  1746      History   Chief Complaint Chief Complaint  Patient presents with   Urinary Frequency    Entered by patient    HPI Tricia Allen is a 28 y.o. female.   Here with complaint of mild dysuria for 2 to 3 days.  She has noticed at least twice that when she wipes after urinating there is blood on the toilet tissue.  She has noticed that her urine is darker and more concentrated.  She has not seen any menstrual bleeding.  Her menstrual cycle is due in the next 2 to 4 days.  She thinks she has a UTI.  She has also had some urgency of urination, frequency of urination, mild dysuria.  Denies fever, nausea, vomiting, diarrhea, constipation.  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  Has seen urology in [place] in the past but that was several years ago.  Refused vaginal exam today.   Urinary Frequency Pertinent negatives include no chest pain, no abdominal pain and no shortness of breath.    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   Anemia 08/08/2022   Chronic migraine with aura without status migrainosus, not intractable 04/24/2022   History of pre-eclampsia 03/27/2022   Prior pregnancy with Fetal renal anomaly 12/26/2016   Anxiety 07/10/2011    Past Surgical History:  Procedure Laterality Date   TONSILLECTOMY AND ADENOIDECTOMY      OB History     Gravida  2   Para  2   Term   2   Preterm      AB      Living  2      SAB      IAB      Ectopic      Multiple  0   Live Births  2            Home Medications    Prior to Admission medications   Not on File    Family History Family History  Problem Relation Age of Onset   Healthy Mother    Asthma Father    Hypertension Father    Heart disease Paternal Grandmother    Cancer Paternal Grandmother    Diabetes Paternal Grandfather     Social History Social History   Tobacco Use   Smoking status: Never   Smokeless tobacco: Never  Vaping Use   Vaping status: Never Used  Substance Use Topics   Alcohol use: No   Drug use: No     Allergies   Codeine   Review of Systems Review of Systems  Constitutional:  Negative for chills and fever.  HENT:  Negative for ear pain and sore throat.   Eyes:  Negative for pain and visual disturbance.  Respiratory:  Negative for cough and shortness of breath.   Cardiovascular:  Negative for chest  pain and palpitations.  Gastrointestinal:  Negative for abdominal pain and vomiting.  Genitourinary:  Positive for dysuria, frequency, hematuria and urgency. Negative for vaginal bleeding, vaginal discharge and vaginal pain.  Musculoskeletal:  Negative for arthralgias and back pain.  Skin:  Negative for color change and rash.  Neurological:  Negative for seizures and syncope.  All other systems reviewed and are negative.    Physical Exam Triage Vital Signs ED Triage Vitals  Encounter Vitals Group     BP 06/07/23 1802 106/73     Systolic BP Percentile --      Diastolic BP Percentile --      Pulse Rate 06/07/23 1802 77     Resp 06/07/23 1802 18     Temp 06/07/23 1802 98.3 F (36.8 C)     Temp Source 06/07/23 1802 Oral     SpO2 06/07/23 1802 98 %     Weight --      Height --      Head Circumference --      Peak Flow --      Pain Score 06/07/23 1800 0     Pain Loc --      Pain Education --      Exclude from Growth Chart --    No data  found.  Updated Vital Signs BP 106/73 (BP Location: Right Arm)   Pulse 77   Temp 98.3 F (36.8 C) (Oral)   Resp 18   LMP 05/13/2023 (Exact Date)   SpO2 98%   Visual Acuity Right Eye Distance:   Left Eye Distance:   Bilateral Distance:    Right Eye Near:   Left Eye Near:    Bilateral Near:     Physical Exam Vitals and nursing note reviewed.  Constitutional:      General: She is not in acute distress.    Appearance: She is well-developed.  HENT:     Head: Normocephalic and atraumatic.     Right Ear: Hearing, tympanic membrane, ear canal and external ear normal.     Left Ear: Hearing, tympanic membrane, ear canal and external ear normal.     Nose: Nose normal.     Mouth/Throat:     Lips: Pink.     Mouth: Mucous membranes are moist.     Pharynx: Uvula midline. No oropharyngeal exudate or posterior oropharyngeal erythema.     Tonsils: No tonsillar exudate.  Eyes:     Conjunctiva/sclera: Conjunctivae normal.     Pupils: Pupils are equal, round, and reactive to light.  Cardiovascular:     Rate and Rhythm: Normal rate and regular rhythm.     Heart sounds: Normal heart sounds, S1 normal and S2 normal. No murmur heard. Pulmonary:     Effort: Pulmonary effort is normal. No respiratory distress.     Breath sounds: Normal breath sounds.  Abdominal:     Palpations: Abdomen is soft.     Tenderness: There is abdominal tenderness (Mild) in the right lower quadrant and left upper quadrant. There is rebound (Left upper quadrant palpation did cause some mild pain and some right lower quadrant pain.). There is no guarding. Negative signs include Murphy's sign and McBurney's sign.  Genitourinary:    Comments: External and internal exams refused by the patient. Musculoskeletal:        General: No swelling.     Cervical back: Neck supple.  Lymphadenopathy:     Cervical: No cervical adenopathy.  Skin:    General: Skin is warm and dry.  Capillary Refill: Capillary refill takes less  than 2 seconds.     Findings: No rash.  Neurological:     Mental Status: She is alert and oriented to person, place, and time.  Psychiatric:        Mood and Affect: Mood normal.        Behavior: Behavior normal. Behavior is cooperative.      UC Treatments / Results  Labs (all labs ordered are listed, but only abnormal results are displayed) Labs Reviewed  POCT URINALYSIS DIP (MANUAL ENTRY) - Abnormal; Notable for the following components:      Result Value   Spec Grav, UA >=1.030 (*)    Blood, UA large (*)    Protein Ur, POC =30 (*)    All other components within normal limits  POCT URINE PREGNANCY - Normal  URINE CULTURE    EKG   Radiology No results found.  Procedures Procedures (including critical care time)  Medications Ordered in UC Medications - No data to display  Initial Impression / Assessment and Plan / UC Course  I have reviewed the triage vital signs and the nursing notes.  Pertinent labs & imaging results that were available during my care of the patient were reviewed by me and considered in my medical decision making (see chart for details).  Gross hematuria, right lower quadrant abdominal pain, dysuria: Patient is concerned that she has UTI because she has seen some blood in her urine, had frequency and urgency of urination, and has some burning when she urinates.  UA shows hematuria on the dipstick.  Microscopic not available.  Culture sent and will adjust the plan of care if needed if the culture is positive.  If the culture is negative patient will be able to review the results on the portal.  Needs a KUB to rule in or out kidney stones.  Provided orders to go to med [facility] for a KUB tomorrow or over the weekend.  Increase fluid intake to 2 to 3 L a day or better.  Declined/refused pelvic or genital exam.  Return here if symptoms do not improve, worsen or new symptoms occur.  Provided information about [facility] so that she could call  and get set up for further workup.  Final Clinical Impressions(s) / UC Diagnoses   Final diagnoses:  Gross hematuria  Dysuria  RLQ abdominal pain     Discharge Instructions      Urine showed blood but no sign of leukocytes or nitrates.  Patient has seen blood in her urine at home.  Will send a culture to rule in or out acute infection.  If culture is negative for infection we will be available on the portal for patient review but she will not be called.  If the culture is negative she needs to see urology for further workup of blood in the urine or hematuria.  If her culture is abnormal and positive for infection she will be called and started on antibiotics.  Encouraged good fluid intake with a goal of 3 L a day.  Provided an order to go to [facility] for an abdominal x-ray to rule in or out kidney stones.  Advised that the abdominal x-ray is a great screening tool but not completely able to see all stones.  She may still need a CT scan.  Return here if symptoms do not resolve, worsen, or new symptoms occur.     ED Prescriptions   None  PDMP not reviewed this encounter.   Ival Domino, FNP 06/07/23 [phone]

## 2023-06-07 NOTE — ED Triage Notes (Signed)
 Pt c/o 2-3 days ago when she wiped she had pink blood discharge but none on her panties, pelvic pressure,  and dark urine.

## 2023-06-07 NOTE — Discharge Instructions (Addendum)
 Urine showed blood but no sign of leukocytes or nitrates.  Patient has seen blood in her urine at home.  Will send a culture to rule in or out acute infection.  If culture is negative for infection we will be available on the portal for patient review but she will not be called.  If the culture is negative she needs to see urology for further workup of blood in the urine or hematuria.  If her culture is abnormal and positive for infection she will be called and started on antibiotics.  Encouraged good fluid intake with a goal of 3 L a day.  Provided an order to go to [facility] for an abdominal x-ray to rule in or out kidney stones.  Advised that the abdominal x-ray is a great screening tool but not completely able to see all stones.  She may still need a CT scan.  Return here if symptoms do not resolve, worsen, or new symptoms occur.

## 2023-06-08 DIAGNOSIS — F411 Generalized anxiety disorder: Secondary | ICD-10-CM | POA: Diagnosis not present

## 2023-06-10 LAB — URINE CULTURE

## 2023-06-11 ENCOUNTER — Telehealth: Payer: Self-pay

## 2023-06-11 MED ORDER — FLUCONAZOLE 150 MG PO TABS
150.0000 mg | ORAL_TABLET | Freq: Every day | ORAL | 0 refills | Status: AC
Start: 2023-06-11 — End: 2023-06-13

## 2023-06-11 MED ORDER — NITROFURANTOIN MONOHYD MACRO 100 MG PO CAPS: 100.0000 mg | ORAL_CAPSULE | Freq: Two times a day (BID) | ORAL | 0 refills | Status: DC

## 2023-06-11 NOTE — Telephone Encounter (Signed)
 See result note.

## 2023-06-18 ENCOUNTER — Other Ambulatory Visit: Payer: Self-pay

## 2023-06-18 ENCOUNTER — Encounter (HOSPITAL_COMMUNITY): Payer: Self-pay

## 2023-06-18 ENCOUNTER — Encounter (HOSPITAL_BASED_OUTPATIENT_CLINIC_OR_DEPARTMENT_OTHER): Payer: Self-pay

## 2023-06-18 ENCOUNTER — Emergency Department (HOSPITAL_COMMUNITY): Payer: Self-pay

## 2023-06-18 ENCOUNTER — Ambulatory Visit (HOSPITAL_BASED_OUTPATIENT_CLINIC_OR_DEPARTMENT_OTHER)
Admission: EM | Admit: 2023-06-18 | Discharge: 2023-06-18 | Disposition: A | Discharge status: Home / Self Care | Payer: Medicaid Other | Attending: Internal Medicine | Admitting: Internal Medicine

## 2023-06-18 ENCOUNTER — Emergency Department (HOSPITAL_COMMUNITY)
Admission: EM | Admit: 2023-06-18 | Discharge: 2023-06-19 | Disposition: A | Discharge status: Home / Self Care | Payer: Self-pay | Attending: Emergency Medicine | Admitting: Emergency Medicine

## 2023-06-18 DIAGNOSIS — R531 Weakness: Secondary | ICD-10-CM | POA: Diagnosis not present

## 2023-06-18 DIAGNOSIS — R0602 Shortness of breath: Secondary | ICD-10-CM

## 2023-06-18 DIAGNOSIS — R0789 Other chest pain: Secondary | ICD-10-CM | POA: Insufficient documentation

## 2023-06-18 DIAGNOSIS — R079 Chest pain, unspecified: Secondary | ICD-10-CM | POA: Diagnosis not present

## 2023-06-18 DIAGNOSIS — R42 Dizziness and giddiness: Secondary | ICD-10-CM | POA: Insufficient documentation

## 2023-06-18 NOTE — ED Notes (Signed)
 During process to collect repeat EKG.  Patient reporting onset of left side chest pain. Unable to be reproduced with palpation to left side of chest. EKG given to provider.

## 2023-06-18 NOTE — ED Triage Notes (Signed)
 Presents stating this morning she noted heaviness in her chest with corresponding pain to bilat lower jaw. States another episode at 1530. No cardiac history. Denies recent stress. States symptoms associated with shortness of breath. Just finished Macrobid  for UTI.

## 2023-06-18 NOTE — Discharge Instructions (Addendum)
 Go to the ER to have further work up

## 2023-06-18 NOTE — ED Notes (Signed)
 Patient is being discharged from the Urgent Care and sent to the Emergency Department via POV . Per S. Rodriguez-Southworth, PA-C, patient is in need of higher level of care due to need for further cardiac workup. Patient is aware and verbalizes understanding of plan of care.  Vitals:   06/18/23 1617  BP: 121/80  Pulse: 98  Resp: 20  Temp: 98.3 F (36.8 C)  SpO2: 98%

## 2023-06-18 NOTE — ED Provider Notes (Signed)
 [facility]    CSN: [id number] Arrival date & time: 06/18/23  1600      History   Chief Complaint Chief Complaint  Patient presents with   Shortness of Breath   Jaw Pain    HPI Tricia Allen is a 28 y.o. female who presents with mother with onset of L heaviness this am in her L chest and bilateral lower jaw pain. This pm around 3:30 pm had another episode of this pain.  They lasted around  a few minutes and resolves. She feels SOB when she gets the chest heaviness. She denies being stressed or ill.     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 Authorizing Provider  nitrofurantoin , macrocrystal-monohydrate, (MACROBID ) 100 MG capsule Take 1 capsule (100 mg total) by mouth 2 (two) times daily. 06/11/23   Lamptey, Aleene KIDD, MD    Family History Family History  Problem  Relation Age of Onset   Healthy Mother    Asthma Father    Hypertension Father    Heart disease Paternal Grandmother    Cancer Paternal Grandmother    Diabetes Paternal Grandfather     Social History Social History   Tobacco Use   Smoking status: Never   Smokeless tobacco: Never  Vaping Use   Vaping status: Never Used  Substance Use Topics   Alcohol use: No   Drug use: No     Allergies   Codeine   Review of Systems Review of Systems As noted in HPI  Physical Exam Triage Vital Signs ED Triage Vitals  Encounter Vitals Group     BP 06/18/23 1617 121/80     Systolic BP Percentile --      Diastolic BP Percentile --      Pulse Rate 06/18/23 1617 98     Resp 06/18/23 1617 20     Temp 06/18/23 1617 98.3 F (36.8 C)     Temp Source 06/18/23 1617 Oral     SpO2 06/18/23 1617 98 %     Weight 06/18/23 1619 136 lb (61.7 kg)  Height --      Head Circumference --      Peak Flow --      Pain Score 06/18/23 1619 3     Pain Loc --      Pain Education --      Exclude from Growth Chart --    No data found.  Updated Vital Signs BP 121/80 (BP Location: Right Arm)   Pulse 98   Temp 98.3 F (36.8 C) (Oral)   Resp 20   Wt 136 lb (61.7 kg)   LMP 06/06/2023 (Exact Date)   SpO2 98%   BMI 24.87 kg/m   Visual Acuity Right Eye Distance:   Left Eye Distance:   Bilateral Distance:    Right Eye Near:   Left Eye Near:    Bilateral Near:     Physical Exam Vitals and nursing note reviewed.  Constitutional:      General: She is not in acute distress.    Appearance: She is normal weight. She is not toxic-appearing.  HENT:     Right Ear: External ear normal.     Left Ear: External ear normal.     Mouth/Throat:     Pharynx: Oropharynx is clear.  Eyes:     General: No scleral icterus.    Conjunctiva/sclera: Conjunctivae normal.  Cardiovascular:     Rate and Rhythm: Normal rate and regular rhythm.     Pulses: Normal pulses.     Heart sounds: No murmur  heard. Pulmonary:     Effort: Pulmonary effort is normal.     Breath sounds: Normal breath sounds.     Comments: I was not able to reproduce her chest pain with palpation Chest:     Chest wall: No tenderness.  Musculoskeletal:        General: Normal range of motion.     Cervical back: Neck supple. No tenderness.  Lymphadenopathy:     Cervical: No cervical adenopathy.  Skin:    General: Skin is warm and dry.     Findings: No rash.  Neurological:     Mental Status: She is alert and oriented to person, place, and time.     Gait: Gait normal.  Psychiatric:        Mood and Affect: Mood normal.        Behavior: Behavior normal.        Thought Content: Thought content normal.        Judgment: Judgment normal.      UC Treatments / Results  Labs (all labs ordered are listed, but only abnormal results are displayed) Labs Reviewed - No data to display  EKG  Shows inverted P waves in lead 2 with initial EKG 3rd repeated  EKG done while pt was asymptomatic and still have inverted P waves   Radiology No results found.  Procedures Procedures (including critical care time)  Medications Ordered in UC Medications - No data to display  Initial Impression / Assessment and Plan / UC Course  I have reviewed the triage vital signs and the nursing notes.  Pertinent labs & imaging results that were available during my care of the patient were reviewed by me and considered in my medical decision making (see chart for details).  Atypical chest pain SOB Abnormal EKG  Sent to ER for further work up   Final Clinical Impressions(s) / UC Diagnoses   Final diagnoses:  SOB (shortness of breath)  Atypical chest pain     Discharge Instructions  Go to the ER to have further work up      ED Prescriptions   None    PDMP not reviewed this encounter.   Lindi Carter, [place] 06/18/23 1717

## 2023-06-18 NOTE — ED Triage Notes (Signed)
 Patient reports chest pain, weakness, SOB, hot flashes, and lightheadedness since this AM. States the chest pain is intermittent and HR elevates when chest pain hits. HR up to 130s. Down to 90s once pain resolved.

## 2023-06-18 NOTE — ED Provider Triage Note (Signed)
 Emergency Medicine Provider Triage Evaluation Note  Tricia Allen , a 28 y.o. female  was evaluated in triage.  Pt complains of R sided chest pain radiating to the left side of the chest and to the jaw starting today. Is accompanied by shortness of breath. Endorses pleuritic chest pain with deep inspirations. Currently asymptomatic. Not on birth control, no leg swelling. Just finished Macrobid  for UTI on Friday.   No family Hx of cardiac issues or clotting.   Review of Systems  Positive: Fatigue, intermittent lower abdominal pain/cramping, intermittent calf pain x 3 days, nausea, diarrhea x 3 days, intermittent  blurry vision and seeing spots. Negative: Vomiting, dysuria  Physical Exam  BP (!) 127/96   Pulse 95   Temp 98.6 F (37 C) (Oral)   Resp (!) 25   Ht 5' 2 (1.575 m)   Wt 61.7 kg   SpO2 100%   BMI 24.87 kg/m  Gen:   Awake, no distress   Resp:  Normal effort  MSK:   Moves extremities without difficulty  Other:    Medical Decision Making  Medically screening exam initiated at 7:20 PM.  Appropriate orders placed.  Tricia Gowda Gratz was informed that the remainder of the evaluation will be completed by another provider, this initial triage assessment does not replace that evaluation, and the importance of remaining in the ED until their evaluation is complete.     Beola Terrall RAMAN, [place] 06/18/23 1928

## 2023-06-19 ENCOUNTER — Encounter (HOSPITAL_BASED_OUTPATIENT_CLINIC_OR_DEPARTMENT_OTHER): Payer: Self-pay

## 2023-06-19 LAB — TROPONIN I (HIGH SENSITIVITY): Troponin I (High Sensitivity): <2 ng/L (ref <18)

## 2023-06-19 NOTE — ED Notes (Signed)
Patient verbalizes understanding of discharge instructions. Opportunity for questioning and answers were provided. Armband removed by staff, pt discharged from ED. Ambulated out to lobby  

## 2023-06-19 NOTE — Discharge Instructions (Signed)
 Evaluation today was overall reassuring.  Recommend you follow-up your PCP.  If your chest pain returns, you have shortness of breath, or any other concerning symptom please return emergency department for further evaluation.

## 2023-06-19 NOTE — ED Provider Notes (Signed)
 [place] EMERGENCY DEPARTMENT AT [facility] Provider Note   CSN: [id number] Arrival date & time: 06/18/23  8177     History  Chief Complaint  Patient presents with   Chest Pain   HPI Tricia Allen is a 28 y.o. female presenting for chest pain, shortness of breath hot flashes lightheadedness and weakness.  Started this morning when she woke up.  Chest pain is intermittent and she noted that her heart rate was up to the 130s on her watch.  States that this time she is neither short of breath or having chest pain.  Does report some pain along the medial aspect of the left calf but denies OCP use, recent long trips and known history of coagulopathy.   Chest Pain      Home Medications Prior to Admission medications   Not on File      Allergies    Codeine    Review of Systems   Review of Systems  Cardiovascular:  Positive for chest pain.    Physical Exam Updated Vital Signs BP 116/81   Pulse 93   Temp 98.2 F (36.8 C)   Resp 16   Ht 5' 2 (1.575 m)   Wt 61.7 kg   SpO2 100%   BMI 24.87 kg/m  Physical Exam Vitals and nursing note reviewed.  HENT:     Head: Normocephalic and atraumatic.     Mouth/Throat:     Mouth: Mucous membranes are moist.  Eyes:     General:        Right eye: No discharge.        Left eye: No discharge.     Conjunctiva/sclera: Conjunctivae normal.  Cardiovascular:     Rate and Rhythm: Normal rate and regular rhythm.     Pulses: Normal pulses.     Heart sounds: Normal heart sounds.  Pulmonary:     Effort: Pulmonary effort is normal.     Breath sounds: Normal breath sounds.  Abdominal:     General: Abdomen is flat.     Palpations: Abdomen is soft.  Musculoskeletal:     Comments: No calf tenderness with palpation.  No evidence of erythema or edema bilaterally. Pedal pulses are 2+ bilaterally.  Skin:    General: Skin is warm and dry.  Neurological:     General: No focal deficit present.  Psychiatric:        Mood  and Affect: Mood normal.     ED Results / Procedures / Treatments   Labs (all labs ordered are listed, but only abnormal results are displayed) Labs Reviewed  BASIC METABOLIC PANEL - Abnormal; Notable for the following components:      Result Value   Glucose, Bld 101 (*)    All other components within normal limits  CBC  HCG, SERUM, QUALITATIVE  DIFFERENTIAL  D-DIMER, QUANTITATIVE  TROPONIN I (HIGH SENSITIVITY)  TROPONIN I (HIGH SENSITIVITY)    EKG EKG Interpretation Date/Time:  Monday June 18 2023 18:35:45 EST Ventricular Rate:  128 PR Interval:  103 QRS Duration:  81 QT Interval:  302 QTC Calculation: 441 R Axis:   51  Text Interpretation: Sinus tachycardia Nonspecific T abnormalities, diffuse leads Confirmed by Griselda Norris [phone]) on 06/19/2023 12:04:57 AM  Radiology DG Chest 2 View Result Date: 06/18/2023 CLINICAL DATA:  Chest pain with weakness and shortness of breath. EXAM: CHEST - 2 VIEW COMPARISON:  None Available. FINDINGS: The heart size and mediastinal contours are within normal limits. Both lungs are  clear. The visualized skeletal structures are unremarkable. IMPRESSION: No active cardiopulmonary disease. Electronically Signed   By: Suzen Dials M.D.   On: 06/18/2023 20:03    Procedures Procedures    Medications Ordered in ED Medications - No data to display  ED Course/ Medical Decision Making/ A&P                                 Medical Decision Making  28 year old well-appearing female presenting for chest and shortness of breath, palpitations and weakness.  Exam was unremarkable.  DDx includes PE, ACS, pregnancy, electrolyte derangement, CHF, other.  Workup was reassuring overall.  I personally reviewed and interpreted labs which reviewed no acute derangements.  I personally reviewed and interpreted x-ray which revealed acute cardiopulmonary process.  I personally reviewed and interpreted EKG which revealed sinus tachycardia.  Considered PE  but unlikely given negative D-dimer, no chest pain on reassessment and no clinical findings of DVT on exam.  Workup also does not suggest ACS or CHF.  Advised her to follow-up PCP.  Discussed.  Return precautions.  Vital stable.  Discharged good condition.        Final Clinical Impression(s) / ED Diagnoses Final diagnoses:  Atypical chest pain    Rx / DC Orders ED Discharge Orders     None         Lang Norleen POUR, PA-C 06/19/23 GLORIANNE Griselda Norris, MD 06/19/23 0210

## 2023-06-22 ENCOUNTER — Ambulatory Visit: Payer: Medicaid Other | Admitting: Family Medicine

## 2023-06-22 VITALS — BP 110/62 | HR 78 | Temp 97.7°F | Ht 62.0 in | Wt 137.0 lb

## 2023-06-22 DIAGNOSIS — R739 Hyperglycemia, unspecified: Secondary | ICD-10-CM | POA: Insufficient documentation

## 2023-06-22 DIAGNOSIS — R202 Paresthesia of skin: Secondary | ICD-10-CM

## 2023-06-22 DIAGNOSIS — F411 Generalized anxiety disorder: Secondary | ICD-10-CM | POA: Diagnosis not present

## 2023-06-22 DIAGNOSIS — R200 Anesthesia of skin: Secondary | ICD-10-CM

## 2023-06-22 DIAGNOSIS — F419 Anxiety disorder, unspecified: Secondary | ICD-10-CM

## 2023-06-22 LAB — HEMOGLOBIN A1C
Est. average glucose Bld gHb Est-mCnc: 100 mg/dL
Hgb A1c MFr Bld: 5.1 % (ref 4.8–5.6)

## 2023-06-22 MED ORDER — METHOCARBAMOL 500 MG PO TABS: 500.0000 mg | ORAL_TABLET | Freq: Two times a day (BID) | ORAL | 0 refills | Status: AC | PRN

## 2023-06-22 MED ORDER — PREDNISONE 20 MG PO TABS: ORAL_TABLET | ORAL | 0 refills | Status: AC

## 2023-06-22 NOTE — Progress Notes (Signed)
Acute Office Visit  Subjective:    Patient ID: Tricia Allen, female    DOB: [birth year], 28 y.o.   MRN: [id number]  Chief Complaint  Patient presents with   Right leg numbness/heaviness    Discussed the use of AI scribe software for clinical note transcription with the patient, who gave verbal consent to proceed.  HPI: Right leg numbness: The patient presents with a new onset of numbness and tingling in her right arm and leg. The symptoms began suddenly, waking her up in the middle of the night. The patient describes the sensation as a heaviness in her leg, with some numbness in her toes. The symptoms are more pronounced when she is lying down or sitting and occur randomly. The patient denies any recent injury or trauma. She also reports a sensation of aching in her leg, but denies any pain. She states that she experienced some neck pain and is wondering if that has something to do with symptoms she is having in her right leg. The patient has tried heat and ibuprofen for symptom relief, but these have not been effective.   Elevated Glucose: Patient would like her A1C checked due to her elevated glucose level and her strong family history of diabetes in her family.  Past Medical History:  Diagnosis Date   Anxiety    Fatigue due to depression 04/12/2023   Left ovarian cyst     Past Surgical History:  Procedure Laterality Date   TONSILLECTOMY AND ADENOIDECTOMY      Family History  Problem Relation Age of Onset   Healthy Mother    Asthma Father    Hypertension Father    Heart disease Paternal Grandmother    Cancer Paternal Grandmother    Diabetes Paternal Grandfather     Social History   Socioeconomic History   Marital status: Single    Spouse name: Not on file   Number of children: Not on file   Years of education: Not on file   Highest education level: 12th grade  Occupational History   Not on file  Tobacco Use   Smoking status: Never   Smokeless tobacco:  Never  Vaping Use   Vaping status: Never Used  Substance and Sexual Activity   Alcohol use: Not Currently   Drug use: Not Currently   Sexual activity: Not Currently    Birth control/protection: None  Other Topics Concern   Not on file  Social History Narrative   ** Merged History Encounter **       Social Drivers of Health   Financial Resource Strain: Low Risk  (06/22/2023)   Overall Financial Resource Strain (CARDIA)    Difficulty of Paying Living Expenses: Not hard at all  Food Insecurity: No Food Insecurity (06/22/2023)   Hunger Vital Sign    Worried About Running Out of Food in the Last Year: Never true    Ran Out of Food in the Last Year: Never true  Transportation Needs: No Transportation Needs (06/22/2023)   PRAPARE - Administrator, Civil Service (Medical): No    Lack of Transportation (Non-Medical): No  Physical Activity: Inactive (06/22/2023)   Exercise Vital Sign    Days of Exercise per Week: 0 days    Minutes of Exercise per Session: 0 min  Stress: Stress Concern Present (06/22/2023)   [employer] of Occupational Health - Occupational Stress Questionnaire    Feeling of Stress : Rather much  Social Connections: Moderately Isolated (06/22/2023)   Social  Connection and Isolation Panel [NHANES]    Frequency of Communication with Friends and Family: More than three times a week    Frequency of Social Gatherings with Friends and Family: Three times a week    Attends Religious Services: Never    Active Member of Clubs or Organizations: No    Attends Banker Meetings: Never    Marital Status: Living with partner  Intimate Partner Violence: Not At Risk (10/16/2022)   Humiliation, Afraid, Rape, and Kick questionnaire    Fear of Current or Ex-Partner: No    Emotionally Abused: No    Physically Abused: No    Sexually Abused: No   x Outpatient Medications Prior to Visit  Medication Sig Dispense Refill   nitrofurantoin,  macrocrystal-monohydrate, (MACROBID) 100 MG capsule Take 1 capsule (100 mg total) by mouth 2 (two) times daily. 10 capsule 0   No facility-administered medications prior to visit.    Allergies  Allergen Reactions   Codeine Other (See Comments)    Hallucinations   Codeine Other (See Comments)    Hallucinations    Review of Systems  Constitutional:  Negative for appetite change, fatigue and fever.  HENT:  Negative for congestion, ear pain, sinus pressure and sore throat.   Respiratory:  Negative for cough, chest tightness, shortness of breath and wheezing.   Cardiovascular:  Negative for chest pain and palpitations.  Gastrointestinal:  Negative for abdominal pain, constipation, diarrhea, nausea and vomiting.  Genitourinary:  Negative for dysuria and hematuria.  Musculoskeletal:  Negative for arthralgias, back pain, joint swelling and myalgias.  Skin:  Negative for rash.  Neurological:  Positive for numbness (Right leg heaviness/weakness). Negative for dizziness, weakness and headaches.  Psychiatric/Behavioral:  Negative for dysphoric mood. The patient is not nervous/anxious.        Objective:        06/22/2023   10:04 AM 06/18/2023   11:45 PM 06/18/2023    8:10 PM  Vitals with BMI  Height 5\' 2"     Weight 137 lbs    BMI 25.05    Systolic 110 116 161  Diastolic 62 81 81  Pulse 78 93 97    Orthostatic VS for the past 72 hrs (Last 3 readings):  Patient Position BP Location  06/22/23 1004 Sitting Left Arm     Physical Exam Vitals reviewed.  Constitutional:      General: She is not in acute distress.    Appearance: Normal appearance. She is not ill-appearing.  HENT:     Head: Normocephalic.  Eyes:     Conjunctiva/sclera: Conjunctivae normal.  Cardiovascular:     Rate and Rhythm: Normal rate and regular rhythm.     Pulses:          Dorsalis pedis pulses are 2+ on the right side.       Posterior tibial pulses are 2+ on the right side.     Heart sounds: Normal heart  sounds. No murmur heard. Pulmonary:     Effort: Pulmonary effort is normal.     Breath sounds: Normal breath sounds. No wheezing.  Musculoskeletal:     Right lower leg: No edema.     Left lower leg: No edema.  Skin:    General: Skin is warm.  Neurological:     Mental Status: She is alert. Mental status is at baseline.     Motor: No weakness.     Gait: Gait is intact.  Psychiatric:        Mood and  Affect: Mood normal.        Behavior: Behavior normal.     Health Maintenance Due  Topic Date Due   COVID-19 Vaccine (1) Never done   Cervical Cancer Screening (Pap smear)  Never done    There are no preventive care reminders to display for this patient.   Lab Results  Component Value Date   TSH 1.34 04/16/2023   Lab Results  Component Value Date   WBC 7.9 06/18/2023   HGB 14.0 06/18/2023   HCT 42.0 06/18/2023   MCV 93.8 06/18/2023   PLT 314 06/18/2023   Lab Results  Component Value Date   NA 139 06/18/2023   K 3.6 06/18/2023   CO2 23 06/18/2023   GLUCOSE 101 (H) 06/18/2023   BUN 7 06/18/2023   CREATININE 0.69 06/18/2023   BILITOT 0.8 04/16/2023   ALKPHOS 42 04/16/2023   AST 12 04/16/2023   ALT 11 04/16/2023   PROT 7.8 04/16/2023   ALBUMIN 5.0 04/16/2023   CALCIUM 9.9 06/18/2023   ANIONGAP 11 06/18/2023   EGFR 120 11/08/2022   GFR 116.87 04/16/2023   No results found for: "CHOL" No results found for: "HDL" No results found for: "LDLCALC" No results found for: "TRIG" No results found for: "CHOLHDL" No results found for: "HGBA1C"     Assessment & Plan:  Numbness and tingling of right leg Assessment & Plan: Possible sciatica or pinched nerve in the neck causing numbness, tingling, and weakness in the right leg. No history of injury. Symptoms worse when lying down or sitting. -Order imaging  -Prescribe Prednisone taper (40mg  for 3 days, 20mg  for 3 days, 10mg  for 3 days) to alleviate symptoms. -methocarbamol 500 mg TWICE A DAY prn   -Follow-up with Dr.  Terrilee Files for further evaluation  Orders: -     DG Neck Soft Tissue; Future -     predniSONE; Take 2 tablets (40 mg total) by mouth daily with breakfast for 3 days, THEN 1 tablet (20 mg total) daily with breakfast for 3 days, THEN 0.5 tablets (10 mg total) daily with breakfast for 3 days.  Dispense: 10.5 tablet; Refill: 0 -     Methocarbamol; Take 1 tablet (500 mg total) by mouth 2 (two) times daily as needed for muscle spasms.  Dispense: 60 tablet; Refill: 0  Elevated serum glucose Assessment & Plan: Fasting glucose slightly elevated at 101. Family history of diabetes. -Order A1c to assess for possible diabetes.  Orders: -     Hemoglobin A1c  Anxiety Assessment & Plan: Interest in using L-theanine and magnesium glycinate for anxiety management. -Encourage to try L-theanine and magnesium glycinate as tolerated.      Meds ordered this encounter  Medications   predniSONE (DELTASONE) 20 MG tablet    Sig: Take 2 tablets (40 mg total) by mouth daily with breakfast for 3 days, THEN 1 tablet (20 mg total) daily with breakfast for 3 days, THEN 0.5 tablets (10 mg total) daily with breakfast for 3 days.    Dispense:  10.5 tablet    Refill:  0   methocarbamol (ROBAXIN) 500 MG tablet    Sig: Take 1 tablet (500 mg total) by mouth 2 (two) times daily as needed for muscle spasms.    Dispense:  60 tablet    Refill:  0    Orders Placed This Encounter  Procedures   DG Neck Soft Tissue   Hemoglobin A1c     Follow-up: Return if symptoms worsen or fail to improve.  An After Visit Summary was printed and given to the patient.  Total time spent on today's visit was 38 minutes, including both face-to-face time and nonface-to-face time personally spent on review of chart (labs and imaging), discussing labs and goals, discussing further work-up, treatment options, referrals to specialist if needed, reviewing outside records if pertinent, answering patient's questions, and coordinating care.     Lajuana Matte, FNP [facility] [phone]

## 2023-06-22 NOTE — Addendum Note (Signed)
Addended by: Precious Reel on: 06/22/2023 11:32 AM   Modules accepted: Orders

## 2023-06-22 NOTE — Assessment & Plan Note (Signed)
Interest in using L-theanine and magnesium glycinate for anxiety management. -Encourage to try L-theanine and magnesium glycinate as tolerated.

## 2023-06-22 NOTE — Assessment & Plan Note (Signed)
Fasting glucose slightly elevated at 101. Family history of diabetes. -Order A1c to assess for possible diabetes.

## 2023-06-22 NOTE — Assessment & Plan Note (Signed)
Possible sciatica or pinched nerve in the neck causing numbness, tingling, and weakness in the right leg. No history of injury. Symptoms worse when lying down or sitting. -Order imaging  -Prescribe Prednisone taper (40mg  for 3 days, 20mg  for 3 days, 10mg  for 3 days) to alleviate symptoms. -methocarbamol 500 mg TWICE A DAY prn   -Follow-up with Dr. Terrilee Files for further evaluation

## 2023-06-25 ENCOUNTER — Other Ambulatory Visit: Payer: Self-pay | Admitting: Family Medicine

## 2023-06-25 DIAGNOSIS — M4143 Neuromuscular scoliosis, cervicothoracic region: Secondary | ICD-10-CM

## 2023-07-02 ENCOUNTER — Encounter: Payer: Self-pay | Admitting: Neurology

## 2023-07-02 ENCOUNTER — Ambulatory Visit: Payer: Medicaid Other | Admitting: Neurology

## 2023-07-02 ENCOUNTER — Ambulatory Visit: Payer: Self-pay | Admitting: Family Medicine

## 2023-07-02 VITALS — BP 109/72 | HR 72 | Ht 62.0 in | Wt 136.0 lb

## 2023-07-02 DIAGNOSIS — R202 Paresthesia of skin: Secondary | ICD-10-CM

## 2023-07-02 DIAGNOSIS — M542 Cervicalgia: Secondary | ICD-10-CM

## 2023-07-02 DIAGNOSIS — R531 Weakness: Secondary | ICD-10-CM

## 2023-07-02 DIAGNOSIS — R42 Dizziness and giddiness: Secondary | ICD-10-CM | POA: Diagnosis not present

## 2023-07-02 DIAGNOSIS — R519 Headache, unspecified: Secondary | ICD-10-CM | POA: Diagnosis not present

## 2023-07-02 NOTE — Progress Notes (Signed)
NEUROLOGY CONSULTATION NOTE  Tricia Allen MRN: [id number] DOB: [birth year]  Referring provider: Lajuana Matte, FNP Primary care provider: Lajuana Matte, FNP  Reason for consult:  dizziness   Thank you for your kind referral of Tricia Allen for consultation of the above symptoms. Although her history is well known to you, please allow me to reiterate it for the purpose of our medical record. She is alone in the office today. Records and images were personally reviewed where available.   HISTORY OF PRESENT ILLNESS: This is a 28 year old right-handed woman with a history of anxiety, depression, presenting for dizziness and right-sided weakness. She states she started having dizziness a couple of months after the delivery of her baby in May 2024. She was dizzy almost daily, feeling lightheaded like she was going to pass out. Vision gets blurred, walking makes it worse. She has noticed bright lights and a lot of stimuli can also trigger it. She feels off balance, no spinning sensation, no nausea/vomiting. She started seeing Cardiology in January 2024 for intermittent palpitations leading to lightheadedness and dizziness. Zio patch and echocardiogram in 07/2022 were normal. The other week she was having it all day and had to lay down, but sometimes dizziness does not resolve even when supine. She has headaches but it is not always associated with the dizziness. She notes headaches started a few months ago with pressure in the frontal regions and a heaviness in the back of her head. Pain in the back of her head occurs when there is neck pain. No nausea/vomiting, there is occasional sensitivity to lights and sounds. Tylenol or Ibuprofen may help, she also takes Excedrin. She has been using a "headache cap" which also helps. Pain occurs a couple of times a week, they do not wake her from sleep. On her PCP appointment on 06/21/22, she reported new onset numbness and tingling of the right arm and  leg. She woke up in the middle of the night on 1/10 with her right leg feeling very heavy. Right arm was also affected. It lasted for a couple of days. She was also having bad neck pain that time. No bowel/bladder dysfunction. She denies any head injuries, falls, or heavy lifting. Last weekend, she had neck pain and right leg heaviness on and off throughout the day. When symptoms occur, the back of her head through the neck really hurts. No facial symptoms but sometimes she feels like she cannot swallow. Vision is blurred, she has floaters. Recent eye exam was normal. She gets an average of 8 hours of sleep.   Diagnostic Data: I personally reviewed MRI brain with and without contrast done 05/2023 which did not show any acute changes. 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  Lab Results  Component Value Date   HGBA1C 5.1 06/22/2023     PAST MEDICAL HISTORY: Past Medical History:  Diagnosis Date   Anxiety    Fatigue due to depression 04/12/2023   Left ovarian cyst     PAST SURGICAL HISTORY: Past Surgical History:  Procedure Laterality Date   TONSILLECTOMY AND ADENOIDECTOMY      MEDICATIONS: Current Outpatient Medications on File Prior to Visit  Medication Sig Dispense Refill   methocarbamol (ROBAXIN) 500 MG tablet Take 1 tablet (500 mg total) by mouth 2 (two) times daily as needed for muscle spasms. (Patient not taking: Reported on 07/02/2023) 60 tablet 0   No current  facility-administered medications on file prior to visit.    ALLERGIES: Allergies  Allergen Reactions   Codeine Other (See Comments)    Hallucinations   Codeine Other (See Comments)    Hallucinations    FAMILY HISTORY: Family History  Problem Relation Age of Onset   Healthy Mother    Asthma Father    Hypertension Father    Heart disease Paternal Grandmother    Cancer Paternal Grandmother    Diabetes Paternal Grandfather      SOCIAL HISTORY: Social History   Socioeconomic History   Marital status: Single    Spouse name: Not on file   Number of children: Not on file   Years of education: Not on file   Highest education level: 12th grade  Occupational History   Not on file  Tobacco Use   Smoking status: Never   Smokeless tobacco: Never  Vaping Use   Vaping status: Never Used  Substance and Sexual Activity   Alcohol use: Not Currently   Drug use: Not Currently   Sexual activity: Not Currently    Birth control/protection: None  Other Topics Concern   Not on file  Social History Narrative   ** Merged History Encounter **    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 boyfriend and two kids   Caffeine One soda a day   Social Drivers of Health   Financial Resource Strain: Low Risk  (06/22/2023)   Overall Financial Resource Strain (CARDIA)    Difficulty of Paying Living Expenses: Not hard at all  Food Insecurity: No Food Insecurity (06/22/2023)   Hunger Vital Sign    Worried About Running Out of Food in the Last Year: Never true    Ran Out of Food in the Last Year: Never true  Transportation Needs: No Transportation Needs (06/22/2023)   PRAPARE - Administrator, Civil Service (Medical): No    Lack of Transportation (Non-Medical): No  Physical Activity: Inactive (06/22/2023)   Exercise Vital Sign    Days of Exercise per Week: 0 days    Minutes of Exercise per Session: 0 min  Stress: Stress Concern Present (06/22/2023)   [employer] of Occupational Health - Occupational Stress Questionnaire    Feeling of Stress : Rather much  Social Connections: Moderately Isolated (06/22/2023)   Social Connection and Isolation Panel [NHANES]    Frequency of Communication with Friends and Family: More than three times a week    Frequency of Social Gatherings with Friends and Family: Three times a week    Attends Religious Services: Never    Active Member of Clubs or Organizations: No     Attends Banker Meetings: Never    Marital Status: Living with partner  Intimate Partner Violence: Not At Risk (10/16/2022)   Humiliation, Afraid, Rape, and Kick questionnaire    Fear of Current or Ex-Partner: No    Emotionally Abused: No    Physically Abused: No    Sexually Abused: No     PHYSICAL EXAM: Vitals:   07/02/23 1018  BP: 109/72  Pulse: 72  SpO2: 98%   General: No acute distress Head:  Normocephalic/atraumatic Skin/Extremities: No rash, no edema Neurological Exam: Mental status: alert and awake, no dysarthria or aphasia, Fund of knowledge is appropriate.   Attention and concentration are normal.     Cranial nerves: CN I: not tested CN II: pupils equal, round, visual fields intact CN III, IV, VI:  full range of motion, no nystagmus,  no ptosis CN V: facial sensation intact CN VII: upper and lower face symmetric CN VIII: hearing intact to conversation CN XI: sternocleidomastoid and trapezius muscles intact CN XII: tongue midline Bulk & Tone: normal, no fasciculations. Motor: 5/5 throughout with no pronator drift. Sensation: intact to light touch, cold, pin, vibration sense.  No extinction to double simultaneous stimulation.  Romberg test negative Deep Tendon Reflexes: +1 both UE, +2 both LE Cerebellar: no incoordination on finger to nose testing Gait: narrow-based and steady, able to tandem walk adequately. Tremor: none   IMPRESSION: This is a 28 year old right-handed woman with a history of anxiety, depression, presenting for dizziness and right-sided weakness. She reports waking up with right leg heaviness and significant neck pain. She has intermittent neck pain radiating to the back of her head. Her neurological exam is normal. MRI brain no acute changes, there is note of low lying tonsils which would not typically cause these symptoms. She has had dizziness since her pregnancy, I do not see a neurological cause for this, she has not seen Cardiology  since July 2024 when Propranolol was recommended. Continue with use of compression stockings, hydration. Etiology of right-sided symptoms unclear, possibly related to cervical issues as she has significant neck pain during the episodes. Cervical MRI without contrast will be ordered to assess for underlying structural abnormality. She will also be scheduled for right upper and lower extremity EMG/NCV. Proceed with physical therapy as planned. Follow-up in 2-3 months, call for any changes.    Thank you for allowing me to participate in the care of this patient. Please do not hesitate to call for any questions or concerns.   Patrcia Dolly, M.D.  CC: Lajuana Matte, FNP

## 2023-07-02 NOTE — Telephone Encounter (Signed)
Chief Complaint: High blood sugar Symptoms: high blood sugar readings, face feels flushed, heart racing, cold sweats Disposition: [] ED /[] Urgent Care (no appt availability in office) / [x] Appointment(In office/virtual)/ []  [facility]/ [] Home Care/ [] Refused Recommended Disposition /[] [facility]/ []  Follow-up with PCP Additional Notes: Patient called in stating she is experiencing high blood sugar. Patient states she ate lunch around 1230, started feeling a littlle off around 1430 and checked her glucose and it was initially 150. She checked it again recently and it was 173. Patient states she is feeling her faced flush, heart racing and cold sweats. Patient states she is not a diabetic, but her PCP informed her she was having slightly elevated glucose levels on recent labwork so she's been testing at home. Advised patient to continue to test and keep a log of readings and what she is eating. Virtual appt created for tomorrow with PCP due to patient unable to come into office because of work.   Reason for Disposition  [1] Caller has NON-URGENT medication or insulin pump question AND [2] triager unable to answer question  Answer Assessment - Initial Assessment Questions 1. BLOOD GLUCOSE: "What is your blood glucose level?"      173  2. ONSET: "When did you check the blood glucose?"     1510 on 01/27 3. USUAL RANGE: "What is your glucose level usually?" (e.g., usual fasting morning value, usual evening value)     I believe 90-100 4. KETONES: "Do you check for ketones (urine or blood test strips)?" If Yes, ask: "What does the test show now?"      No 5. TYPE 1 or 2:  "Do you know what type of diabetes you have?"  (e.g., Type 1, Type 2, Gestational; doesn't know)      Not a diabetic 6. INSULIN: "Do you take insulin?" "What type of insulin(s) do you use? What is the mode of delivery? (syringe, pen; injection or pump)?"      Not a diabetic 7. DIABETES PILLS: "Do you take any  pills for your diabetes?" If Yes, ask: "Have you missed taking any pills recently?"     Not on any medication 8. OTHER SYMPTOMS: "Do you have any symptoms?" (e.g., fever, frequent urination, difficulty breathing, dizziness, weakness, vomiting)     Racing heart, face feels flushed, cold chills  Protocols used: Diabetes - High Blood Sugar-A-AH

## 2023-07-02 NOTE — Patient Instructions (Signed)
Good to meet you.  Schedule cervical MRI without contrast  2. Schedule EMG/NCV of the right arm and leg  3. Proceed with physical therapy  4. Follow-up in 2-3 months, call for any changes

## 2023-07-03 ENCOUNTER — Encounter: Payer: Self-pay | Admitting: Family Medicine

## 2023-07-03 ENCOUNTER — Telehealth (INDEPENDENT_AMBULATORY_CARE_PROVIDER_SITE_OTHER): Payer: Medicaid Other | Admitting: Family Medicine

## 2023-07-03 VITALS — Ht 62.0 in | Wt 136.0 lb

## 2023-07-03 DIAGNOSIS — R200 Anesthesia of skin: Secondary | ICD-10-CM

## 2023-07-03 DIAGNOSIS — R42 Dizziness and giddiness: Secondary | ICD-10-CM

## 2023-07-03 DIAGNOSIS — R202 Paresthesia of skin: Secondary | ICD-10-CM | POA: Diagnosis not present

## 2023-07-03 DIAGNOSIS — R739 Hyperglycemia, unspecified: Secondary | ICD-10-CM

## 2023-07-03 DIAGNOSIS — F419 Anxiety disorder, unspecified: Secondary | ICD-10-CM

## 2023-07-03 NOTE — Progress Notes (Signed)
Virtual Visit via Video Note   This visit type was conducted per patient request This format is felt to be appropriate for this patient at this time.  All issues noted in this document were discussed and addressed.  A limited physical exam was performed with this format.  A verbal consent was obtained for the virtual visit.   Date:  07/03/2023   ID:  Tricia Allen, DOB 08/04/1995, MRN [id number]  Patient Location: Home Provider Location: Office/Clinic  PCP:  Renne Crigler, FNP   Chief Complaint:  High Blood sugar  Discussed the use of AI scribe software for clinical note transcription with the patient, who gave verbal consent to proceed.   History of Present Illness:    Tricia Allen is a 28 y.o. female presents with concerns about high blood sugar readings and related symptoms.  The patient has experienced high blood sugar readings, with the highest being 173 mg/dL, which later decreased to 101 mg/dL. Their A1c was checked two weeks ago and was normal at 5.1%.  They describe symptoms such as facial flushing, redness, lightheadedness, and shaking around 1:30 PM after eating at 12:30 PM. These symptoms were initially felt during a doctor's appointment, and their blood sugar was 173 mg/dL when checked at home around 2:30 PM. They also report increased urination, urinating four times within a short period, which they associate with high blood sugar. No increased thirst, headache, or fatigue.  They have a family history of diabetes and have been advised to monitor their A1c every three months due to this family history.  Chief Complaint  Patient presents with   Hyperglycemia  .  The patient does not have symptoms concerning for COVID-19 infection (fever, chills, cough, or new shortness of breath).    Past Medical History:  Diagnosis Date   Anxiety    Fatigue due to depression 04/12/2023   Left ovarian cyst     Past Surgical History:  Procedure Laterality Date    TONSILLECTOMY AND ADENOIDECTOMY      Family History  Problem Relation Age of Onset   Healthy Mother    Asthma Father    Hypertension Father    Heart disease Paternal Grandmother    Cancer Paternal Grandmother    Diabetes Paternal Grandfather     Social History   Socioeconomic History   Marital status: Single    Spouse name: Not on file   Number of children: Not on file   Years of education: Not on file   Highest education level: 12th grade  Occupational History   Not on file  Tobacco Use   Smoking status: Never   Smokeless tobacco: Never  Vaping Use   Vaping status: Never Used  Substance and Sexual Activity   Alcohol use: Not Currently   Drug use: Not Currently   Sexual activity: Not Currently    Birth control/protection: None  Other Topics Concern   Not on file  Social History Narrative   ** Merged History Encounter **    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 boyfriend and two kids   Caffeine One soda a day   Social Drivers of Health   Financial Resource Strain: Low Risk  (06/22/2023)   Overall Financial Resource Strain (CARDIA)    Difficulty of Paying Living Expenses: Not hard at all  Food Insecurity: No Food Insecurity (06/22/2023)   Hunger Vital Sign    Worried About Running Out of Food in the Last Year: Never  true    Ran Out of Food in the Last Year: Never true  Transportation Needs: No Transportation Needs (06/22/2023)   PRAPARE - Administrator, Civil Service (Medical): No    Lack of Transportation (Non-Medical): No  Physical Activity: Inactive (06/22/2023)   Exercise Vital Sign    Days of Exercise per Week: 0 days    Minutes of Exercise per Session: 0 min  Stress: Stress Concern Present (06/22/2023)   [employer] of Occupational Health - Occupational Stress Questionnaire    Feeling of Stress : Rather much  Social Connections: Moderately Isolated (06/22/2023)   Social Connection and Isolation Panel [NHANES]    Frequency of  Communication with Friends and Family: More than three times a week    Frequency of Social Gatherings with Friends and Family: Three times a week    Attends Religious Services: Never    Active Member of Clubs or Organizations: No    Attends Banker Meetings: Never    Marital Status: Living with partner  Intimate Partner Violence: Not At Risk (10/16/2022)   Humiliation, Afraid, Rape, and Kick questionnaire    Fear of Current or Ex-Partner: No    Emotionally Abused: No    Physically Abused: No    Sexually Abused: No    Outpatient Medications Prior to Visit  Medication Sig Dispense Refill   methocarbamol (ROBAXIN) 500 MG tablet Take 1 tablet (500 mg total) by mouth 2 (two) times daily as needed for muscle spasms. (Patient not taking: Reported on 07/02/2023) 60 tablet 0   NON FORMULARY Take 200 mg by mouth daily. L- Theamine     NON FORMULARY Take 150 mg by mouth once. Ashwagdha     No facility-administered medications prior to visit.    Allergies  Allergen Reactions   Codeine Other (See Comments)    Hallucinations   Codeine Other (See Comments)    Hallucinations     Social History   Tobacco Use   Smoking status: Never   Smokeless tobacco: Never  Vaping Use   Vaping status: Never Used  Substance Use Topics   Alcohol use: Not Currently   Drug use: Not Currently     Review of Systems  Constitutional:  Negative for fever and malaise/fatigue.  HENT:  Negative for congestion, ear pain and sore throat.   Respiratory:  Negative for cough and shortness of breath.   Cardiovascular:  Negative for chest pain.  Gastrointestinal:  Negative for abdominal pain, constipation and diarrhea.  Genitourinary:  Positive for frequency. Negative for dysuria.  Musculoskeletal:  Negative for myalgias.  Neurological:  Positive for dizziness. Negative for weakness and headaches.  Psychiatric/Behavioral:  The patient is nervous/anxious.      Labs/Other Tests and Data Reviewed:     Recent Labs: 08/28/2022: B Natriuretic Peptide 49.9 11/08/2022: Magnesium 1.8 04/16/2023: ALT 11; TSH 1.34 06/18/2023: BUN 7; Creatinine, Ser 0.69; Hemoglobin 14.0; Platelets 314; Potassium 3.6; Sodium 139   Recent Lipid Panel No results found for: "CHOL", "TRIG", "HDL", "CHOLHDL", "LDLCALC", "LDLDIRECT"  Wt Readings from Last 3 Encounters:  07/03/23 136 lb (61.7 kg)  07/02/23 136 lb (61.7 kg)  06/22/23 137 lb (62.1 kg)     Objective:    Vital Signs:  Ht 5\' 2"  (1.575 m)   Wt 136 lb (61.7 kg)   LMP 06/06/2023 (Exact Date)   BMI 24.87 kg/m    Physical Exam Constitutional:      General: She is not in acute distress.  Appearance: Normal appearance.  Pulmonary:     Effort: Pulmonary effort is normal.  Neurological:     Mental Status: She is alert.  Psychiatric:        Mood and Affect: Mood normal.        Behavior: Behavior normal.      ASSESSMENT & PLAN:   Elevated blood sugar Assessment & Plan: Recent episode of high blood glucose reading (173) with symptoms of flushing, lightheadedness, and increased urination. A1c was normal (5.1). Discussed the criteria for diabetes diagnosis and the symptoms of hyperglycemia. -Continue monitoring blood glucose levels. -Check A1c in March 2025.   Dizziness Assessment & Plan: Chronic - not improving,  -Dr. Terrilee Files has ordered an MRI to R/O Sheehan syndrome -FU scheduled 07/08/23   Anxiety Assessment & Plan: Taking ashwagandha and L-lysine with some improvement. -Continue current regimen. -Consider increasing ashwagandha with her night dose -Reach out to Yvette Rack, NP regarding referral sent a few months ago for anxiety.   Numbness and tingling of right leg Assessment & Plan: Acute Patient reports that she hasn't got a call regarding her referral to PT. I told her that I would look into it and follow-up with our referral coordinator.  She saw Dr. Patrcia Dolly on 07/02/23 and she ordered the following. 1.Schedule  cervical MRI without contrast  2. Schedule EMG/NCV of the right arm and leg  3. Proceed with physical therapy Sent message to our referral coordinator for updates, since she just saw Dr. Karel Jarvis yesterday she probably hasn't had a chance to reach out to her yet.      No orders of the defined types were placed in this encounter.    No orders of the defined types were placed in this encounter.   Total time spent on today's visit was 32 minutes, including both face-to-face time and nonface-to-face time personally spent on review of chart (labs and imaging), discussing labs and goals, discussing further work-up, treatment options, referrals to specialist if needed, reviewing outside records if pertinent, answering patient's questions, and coordinating care.    Follow Up:  In Person in 3 month(s)  Lajuana Matte, FNP [facility] [phone]

## 2023-07-03 NOTE — Assessment & Plan Note (Addendum)
Chronic - not improving,  -Dr. Terrilee Files has ordered an MRI to R/O Sheehan syndrome -FU scheduled 07/08/23

## 2023-07-03 NOTE — Assessment & Plan Note (Signed)
Taking ashwagandha and L-lysine with some improvement. -Continue current regimen. -Consider increasing ashwagandha with her night dose -Reach out to Yvette Rack, NP regarding referral sent a few months ago for anxiety.

## 2023-07-03 NOTE — Assessment & Plan Note (Signed)
Acute Patient reports that she hasn't got a call regarding her referral to PT. I told her that I would look into it and follow-up with our referral coordinator.  She saw Dr. Patrcia Dolly on 07/02/23 and she ordered the following. 1.Schedule cervical MRI without contrast  2. Schedule EMG/NCV of the right arm and leg  3. Proceed with physical therapy Sent message to our referral coordinator for updates, since she just saw Dr. Karel Jarvis yesterday she probably hasn't had a chance to reach out to her yet.

## 2023-07-03 NOTE — Assessment & Plan Note (Signed)
Recent episode of high blood glucose reading (173) with symptoms of flushing, lightheadedness, and increased urination. A1c was normal (5.1). Discussed the criteria for diabetes diagnosis and the symptoms of hyperglycemia. -Continue monitoring blood glucose levels. -Check A1c in March 2025.

## 2023-07-04 ENCOUNTER — Encounter: Payer: Self-pay | Admitting: Neurology

## 2023-07-05 ENCOUNTER — Ambulatory Visit (HOSPITAL_BASED_OUTPATIENT_CLINIC_OR_DEPARTMENT_OTHER): Payer: Self-pay

## 2023-07-07 DIAGNOSIS — Z419 Encounter for procedure for purposes other than remedying health state, unspecified: Secondary | ICD-10-CM | POA: Diagnosis not present

## 2023-07-08 NOTE — Progress Notes (Incomplete)
NEUROLOGY CONSULTATION NOTE  Tricia Allen MRN: [id number] DOB: [birth year]  Referring provider: Lajuana Matte, FNP Primary care provider: Lajuana Matte, FNP  Reason for consult:  dizziness   Thank you for your kind referral of Tricia Allen for consultation of the above symptoms. Although her history is well known to you, please allow me to reiterate it for the purpose of our medical record. She is alone in the office today. Records and images were personally reviewed where available.   HISTORY OF PRESENT ILLNESS: This is a 28 year old right-handed woman with a history of anxiety, depression, presenting for dizziness and right-sided weakness. She states she started having dizziness a couple of months after the delivery of her baby in May 2024. She was dizzy almost daily, feeling lightheaded like she was going to pass out. Vision gets blurred, walking makes it worse. She has noticed bright lights and a lot of stimuli can also trigger it. She feels off balance, no spinning sensation, no nausea/vomiting. She started seeing Cardiology in January 2024 for intermittent palpitations leading to lightheadedness and dizziness. Zio patch and echocardiogram in 07/2022 were normal. The other week she was having it all day and had to lay down, but sometimes dizziness does not resolve even when supine. She has headaches but it is not always associated with the dizziness. She notes headaches started a few months ago with pressure in the frontal regions and a heaviness in the back of her head. Pain in the back of her head occurs when there is neck pain. No nausea/vomiting, there is occasional sensitivity to lights and sounds. Tylenol or Ibuprofen may help, she also takes Excedrin. She has been using a "headache cap" which also helps. Pain occurs a couple of times a week, they do not wake her from sleep. On 06/21/22, she reported new onset numbness and tingling of the right arm and leg to her PCP. She woke  up in the middle of the night with her right leg feeling very heavy. Right arm was also affected. It lasted for a couple of days. She was also having bad neck pain that time. No bowel/bladder dysfunction. She denies any head injuries, falls, or heavy lifting.   RH Woke up in middle of night right leg very heavy and right arm, for a couple of days; has bad neck pain comes and goes, happeniend when the leg was occurring, upper back pain, no bowel/bladder, no fall,no head injuries, no heavy lifting  ***.  1/17: new onset of numbness and tingling in her right arm and leg. The symptoms began suddenly, waking her up in the middle of the night. The patient describes the sensation as a heaviness in her leg, with some numbness in her toes. The symptoms are more pronounced when she is lying down or sitting and occur randomly. 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, Sales executive, gets clumsy with it No facial sx, sometimes feels like she cannot swallow, for a while now HAs in the back of head, when neck is hurting; always on the left frontal; a few months, frontal pressure; in back of head like really heavy, no n/v, occl photo/phono, tylenol or ibuprof may help, may take excedrin; headche cap helps; couple of times a week; does nt wake from sleep 8mos baby, started after delivery No fhx of  headaches No staring Vision is blurry sometimes separate from sx, sees floaters, eye exam was normal Ave 8hrs, no related to sleep Couple of months after delivery, almost daily, lightheaded like going to pass out, vision gets blurred, other week all day, had to lay down, sometimes does not go away even when lying down Walking makes it worse sometimes, bright lights and a lot going on can trigger, off balance, no spinning, no n/v, not always with a HA, no focal sx  +1 both  UE, +2 both LE Wears compression stocking     Lab Results  Component Value Date   HGBA1C 5.1 06/22/2023    Dizzinesss, neck pain, right leg numb Laboratory Data:   PAST MEDICAL HISTORY: Past Medical History:  Diagnosis Date  . Anxiety   . Fatigue due to depression 04/12/2023  . Left ovarian cyst     PAST SURGICAL HISTORY: Past Surgical History:  Procedure Laterality Date  . TONSILLECTOMY AND ADENOIDECTOMY      MEDICATIONS: Current Outpatient Medications on File Prior to Visit  Medication Sig Dispense Refill  . methocarbamol (ROBAXIN) 500 MG tablet Take 1 tablet (500 mg total) by mouth 2 (two) times daily as needed for muscle spasms. (Patient not taking: Reported on 07/02/2023) 60 tablet 0   No current facility-administered medications on file prior to visit.    ALLERGIES: Allergies  Allergen Reactions  . Codeine Other (See Comments)    Hallucinations  . Codeine Other (See Comments)    Hallucinations    FAMILY HISTORY: Family History  Problem Relation Age of Onset  . Healthy Mother   . Asthma Father   . Hypertension Father   . Heart disease Paternal Grandmother   . Cancer Paternal Grandmother   . Diabetes Paternal Grandfather     SOCIAL HISTORY: Social History   Socioeconomic History  . Marital status: Single    Spouse name: Not on file  . Number of children: Not on file  . Years of education: Not on file  . Highest education level: 12th grade  Occupational History  . Not on file  Tobacco Use  . Smoking status: Never  . Smokeless tobacco: Never  Vaping Use  . Vaping status: Never Used  Substance and Sexual Activity  . Alcohol use: Not Currently  . Drug use: Not Currently  . Sexual activity: Not Currently    Birth control/protection: None  Other Topics Concern  . Not on file  Social History Narrative   ** Merged History Encounter **    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 boyfriend and two kids   Caffeine One soda a day    Social Drivers of Health   Financial Resource Strain: Low Risk  (06/22/2023)   Overall Financial Resource Strain (CARDIA)   . Difficulty of Paying Living Expenses: Not hard at all  Food Insecurity: No Food Insecurity (06/22/2023)   Hunger Vital Sign   . Worried About Programme researcher, broadcasting/film/video in the Last Year: Never true   . Ran Out of Food in the Last Year: Never true  Transportation Needs: No Transportation Needs (06/22/2023)   PRAPARE - Transportation   . Lack of Transportation (Medical): No   . Lack of Transportation (Non-Medical): No  Physical Activity: Inactive (06/22/2023)   Exercise Vital Sign   . Days of Exercise per Week: 0 days   . Minutes of Exercise per Session: 0 min  Stress: Stress Concern Present (06/22/2023)   [employer] of Occupational Health -  Occupational Stress Questionnaire   . Feeling of Stress : Rather much  Social Connections: Moderately Isolated (06/22/2023)   Social Connection and Isolation Panel [NHANES]   . Frequency of Communication with Friends and Family: More than three times a week   . Frequency of Social Gatherings with Friends and Family: Three times a week   . Attends Religious Services: Never   . Active Member of Clubs or Organizations: No   . Attends Banker Meetings: Never   . Marital Status: Living with partner  Intimate Partner Violence: Not At Risk (10/16/2022)   Humiliation, Afraid, Rape, and Kick questionnaire   . Fear of Current or Ex-Partner: No   . Emotionally Abused: No   . Physically Abused: No   . Sexually Abused: No     PHYSICAL EXAM: Vitals:   07/02/23 1018  BP: 109/72  Pulse: 72  SpO2: 98%   General: No acute distress Head:  Normocephalic/atraumatic Skin/Extremities: No rash, no edema Neurological Exam: Mental status: alert and oriented to person, place, and time, no dysarthria or aphasia, Fund of knowledge is appropriate.  Recent and remote memory are intact.  Attention and concentration are normal.     Able to name objects and repeat phrases. Cranial nerves: CN I: not tested CN II: pupils equal, round and reactive to light, visual fields intact CN III, IV, VI:  full range of motion, no nystagmus, no ptosis CN V: facial sensation intact CN VII: upper and lower face symmetric CN VIII: hearing intact to conversation CN IX, X: gag intact, uvula midline CN XI: sternocleidomastoid and trapezius muscles intact CN XII: tongue midline Bulk & Tone: normal, no fasciculations. Motor: 5/5 throughout with no pronator drift. Sensation: intact to light touch, cold, pin, vibration sense.  No extinction to double simultaneous stimulation.  Romberg test *** Deep Tendon Reflexes: +2 throughout, no ankle clonus Plantar responses: downgoing bilaterally Cerebellar: no incoordination on finger to nose testing Gait: narrow-based and steady, able to tandem walk adequately. Tremor: ***   IMPRESSION: This is a *** year old ***-handed *** with a history of ***.    The duration of this appointment visit was *** minutes of face-to-face time with the patient.  Greater than 50% of this time was spent in counseling, explanation of diagnosis, planning of further management, and coordination of care.  Thank you for allowing me to participate in the care of this patient. Please do not hesitate to call for any questions or concerns.   Patrcia Dolly, M.D.  CC: ***

## 2023-07-09 ENCOUNTER — Ambulatory Visit (HOSPITAL_BASED_OUTPATIENT_CLINIC_OR_DEPARTMENT_OTHER): Payer: Medicaid Other

## 2023-07-09 ENCOUNTER — Encounter: Payer: Self-pay | Admitting: Neurology

## 2023-07-12 NOTE — Progress Notes (Signed)
 [facility] [address] [place] [address] Phone: [phone] Subjective:   Tricia Allen, am serving as a scribe for Dr. Arthea Allen.  I'm seeing this patient by the request  of:  Teressa Harrie HERO, FNP  CC: Back and neck pain  YEP:Dlagzrupcz  Tricia Allen is a 28 y.o. female coming in with complaint of back and neck pain. OMT 06/01/2023. Seen in ED multiple times since last visit.  Patient has been seen for atypical chest pain, shortness of breath as well as gross hematuria.  Patient states she has been doing well since last visit.   Medications patient has been prescribed: None  Taking:      Patient was recently seen in January for a urinary tract infection and was on Macrobid . Previous laboratory workup showed B12 being minorly low at 348, ferritin extremely low at 5.4 but then seem to rebound to 13  Reviewed prior external information including notes and imaging from previsou exam, outside providers and external EMR if available.   As well as notes that were available from care everywhere and other healthcare systems.  Past medical history, social, surgical and family history all reviewed in electronic medical record.  No pertanent information unless stated regarding to the chief complaint.   Past Medical History:  Diagnosis Date   Anxiety    Fatigue due to depression 04/12/2023   Left ovarian cyst     Allergies  Allergen Reactions   Codeine Other (See Comments)    Hallucinations   Codeine Other (See Comments)    Hallucinations     Review of Systems:  No headache, visual changes, nausea, vomiting, diarrhea, constipation, dizziness, abdominal pain, skin rash, fevers, chills, night sweats, weight loss, swollen lymph nodes, body aches, joint swelling, chest pain, shortness of breath, mood changes. POSITIVE muscle aches  Objective  Blood pressure 110/78, pulse 67, height 5' 2 (1.575 m), weight 136 lb (61.7 kg), last  menstrual period 06/06/2023, SpO2 100%, not currently breastfeeding.   General: No apparent distress alert and oriented x3 mood and affect normal, dressed appropriately.  HEENT: Pupils equal, extraocular movements intact  Respiratory: Patient's speak in full sentences and does not appear short of breath  Cardiovascular: No lower extremity edema, non tender, no erythema  Gait MSK:  Back hypermobility noted.  Patient does have significant tightness noted in the lower back.  Seems to be tighter right greater than left.  Osteopathic findings  C2 flexed rotated and side bent right C7 flexed rotated and side bent right T3 extended rotated and side bent right inhaled rib T7 extended rotated and side bent left L2 flexed rotated and side bent right L5 flexed rotated and side bent left Sacrum right on right       Assessment and Plan:  Myalgia Continues to have some myalgia this seems to be out of proportion.  Discussed icing regimen and home exercises, which activities to do and which ones to avoid.  Increase activity slowly otherwise.  Does respond well to osteopathic manipulation follow-up again 6 to 8 weeks.    Nonallopathic problems  Decision today to treat with OMT was based on Physical Exam  After verbal consent patient was treated with HVLA, ME, FPR techniques in cervical, rib, thoracic, lumbar, and sacral  areas  Patient tolerated the procedure well with improvement in symptoms  Patient given exercises, stretches and lifestyle modifications  See medications in patient instructions if given  Patient will follow up in 4-8  weeks    The above documentation has been reviewed and is accurate and complete Tricia CHRISTELLA Sharps, DO          Note: This dictation was prepared with Dragon dictation along with smaller phrase technology. Any transcriptional errors that result from this process are unintentional.

## 2023-07-13 ENCOUNTER — Encounter: Payer: Self-pay | Admitting: Family Medicine

## 2023-07-13 ENCOUNTER — Ambulatory Visit: Payer: Medicaid Other | Admitting: Family Medicine

## 2023-07-13 VITALS — BP 110/78 | HR 67 | Ht 62.0 in | Wt 136.0 lb

## 2023-07-13 VITALS — BP 90/64 | HR 88 | Temp 98.2°F | Resp 16 | Ht 62.0 in | Wt 137.2 lb

## 2023-07-13 DIAGNOSIS — M9908 Segmental and somatic dysfunction of rib cage: Secondary | ICD-10-CM

## 2023-07-13 DIAGNOSIS — M9901 Segmental and somatic dysfunction of cervical region: Secondary | ICD-10-CM

## 2023-07-13 DIAGNOSIS — M9903 Segmental and somatic dysfunction of lumbar region: Secondary | ICD-10-CM | POA: Diagnosis not present

## 2023-07-13 DIAGNOSIS — M9904 Segmental and somatic dysfunction of sacral region: Secondary | ICD-10-CM | POA: Diagnosis not present

## 2023-07-13 DIAGNOSIS — M791 Myalgia, unspecified site: Secondary | ICD-10-CM | POA: Diagnosis not present

## 2023-07-13 DIAGNOSIS — N3942 Incontinence without sensory awareness: Secondary | ICD-10-CM

## 2023-07-13 DIAGNOSIS — M9902 Segmental and somatic dysfunction of thoracic region: Secondary | ICD-10-CM

## 2023-07-13 DIAGNOSIS — N390 Urinary tract infection, site not specified: Secondary | ICD-10-CM

## 2023-07-13 LAB — POCT URINALYSIS DIP (CLINITEK)
Bilirubin, UA: NEGATIVE
Blood, UA: NEGATIVE
Glucose, UA: NEGATIVE mg/dL
Ketones, POC UA: NEGATIVE mg/dL
Leukocytes, UA: NEGATIVE
Nitrite, UA: NEGATIVE
POC PROTEIN,UA: NEGATIVE
Spec Grav, UA: 1.015 (ref 1.010–1.025)
Urobilinogen, UA: 0.2 U/dL
pH, UA: 6 (ref 5.0–8.0)

## 2023-07-13 NOTE — Assessment & Plan Note (Signed)
 Continues to have some myalgia this seems to be out of proportion.  Discussed icing regimen and home exercises, which activities to do and which ones to avoid.  Increase activity slowly otherwise.  Does respond well to osteopathic manipulation follow-up again 6 to 8 weeks.

## 2023-07-13 NOTE — Patient Instructions (Signed)
 See me in 6-8 weeks

## 2023-07-13 NOTE — Progress Notes (Signed)
 Subjective:  Patient ID: Tricia Allen, female    DOB: May 24, 1996  Age: 28 y.o. MRN: [id number]  Chief Complaint  Patient presents with   Anxiety    Follow up from previous UTI.   HPI: Tricia Allen is a 28 year old female who presents with anxiety and numbness. She is accompanied by her son, who has Smith-McInnes syndrome.  She experiences ongoing anxiety symptoms despite using L-lysine, which provides some relief by making her feel more relaxed and calm with a lower heart rate. However, she still feels persistently anxious and describes feeling 'stuck at this anxiety' despite these measures. Previous trials of SSRIs, including Lexapro, were inconclusive due to concurrent medications.  She experiences numbness and tingling that intermittently comes and goes. She has a history of being on a prednisone  taper and currently takes methocarbamol  as needed. She is scheduled for an MRI on Monday and nerve testing later this month to further investigate these symptoms.  She describes fluctuations in her blood sugar levels, noting an episode where she felt shaky and her blood sugar was 61 mg/dL after eating. She mentions her blood pressure was high previously and experiences significant fluctuations in her blood sugar levels, which she finds 'weird'.  She reports an episode of urinary incontinence, which occurred one night without her awareness. She initially thought her son had wet the bed, but realized it was her. This was an isolated incident.      07/13/2023    8:40 AM 04/12/2023    3:48 PM 03/27/2022    9:26 AM  Depression screen PHQ 2/9  Decreased Interest 1 2 0  Down, Depressed, Hopeless 1 2 0  PHQ - 2 Score 2 4 0  Altered sleeping 0 1 0  Tired, decreased energy 1 2 2   Change in appetite 1 3 0  Feeling bad or failure about yourself  0 0 0  Trouble concentrating 1 3 0  Moving slowly or fidgety/restless 0 0 0  Suicidal thoughts 0 0 0  PHQ-9 Score 5 13 2   Difficult  doing work/chores  Very difficult         07/13/2023    8:39 AM  Fall Risk   Falls in the past year? 0  Number falls in past yr: 0  Injury with Fall? 1  Risk for fall due to : No Fall Risks  Follow up Falls evaluation completed    Patient Care Team: Teressa Harrie HERO, FNP as PCP - General (Family Medicine) Jeralyn Crutch, MD as PCP - OBGYN (Obstetrics and Gynecology) Tobb, Kardie, DO as PCP - Cardiology (Cardiology) Claudene Arthea HERO, DO as Consulting Physician (Family Medicine) Teressa Harrie HERO, FNP (Family Medicine)   Review of Systems  Constitutional: Negative.   HENT: Negative.    Eyes: Negative.   Respiratory: Negative.    Cardiovascular: Negative.   Gastrointestinal: Negative.   Endocrine: Negative.   Genitourinary: Negative.   Musculoskeletal: Negative.   Skin: Negative.   Allergic/Immunologic: Negative.   Neurological:  Positive for dizziness.  Hematological: Negative.   Psychiatric/Behavioral: Negative.      Current Outpatient Medications on File Prior to Visit  Medication Sig Dispense Refill   NON FORMULARY Take 200 mg by mouth daily. L- Theamine     NON FORMULARY Take 150 mg by mouth once. Ashwagdha     methocarbamol  (ROBAXIN ) 500 MG tablet Take 1 tablet (500 mg total) by mouth 2 (two) times daily as needed for muscle spasms. 60 tablet 0  No current facility-administered medications on file prior to visit.   Past Medical History:  Diagnosis Date   Anxiety    Fatigue due to depression 04/12/2023   Left ovarian cyst    Past Surgical History:  Procedure Laterality Date   TONSILLECTOMY AND ADENOIDECTOMY      Family History  Problem Relation Age of Onset   Healthy Mother    Asthma Father    Hypertension Father    Heart disease Paternal Grandmother    Cancer Paternal Grandmother    Diabetes Paternal Grandfather    Social History   Socioeconomic History   Marital status: Single    Spouse name: Not on file   Number of children: Not on file   Years  of education: Not on file   Highest education level: 12th grade  Occupational History   Not on file  Tobacco Use   Smoking status: Never   Smokeless tobacco: Never  Vaping Use   Vaping status: Never Used  Substance and Sexual Activity   Alcohol use: Not Currently   Drug use: Not Currently   Sexual activity: Not Currently    Birth control/protection: None  Other Topics Concern   Not on file  Social History Narrative   ** Merged History Encounter **    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 boyfriend and two kids   Caffeine  One soda a day   Social Drivers of Health   Financial Resource Strain: Low Risk  (06/22/2023)   Overall Financial Resource Strain (CARDIA)    Difficulty of Paying Living Expenses: Not hard at all  Food Insecurity: No Food Insecurity (06/22/2023)   Hunger Vital Sign    Worried About Running Out of Food in the Last Year: Never true    Ran Out of Food in the Last Year: Never true  Transportation Needs: No Transportation Needs (06/22/2023)   PRAPARE - Administrator, Civil Service (Medical): No    Lack of Transportation (Non-Medical): No  Physical Activity: Inactive (06/22/2023)   Exercise Vital Sign    Days of Exercise per Week: 0 days    Minutes of Exercise per Session: 0 min  Stress: Stress Concern Present (06/22/2023)   [employer] of Occupational Health - Occupational Stress Questionnaire    Feeling of Stress : Rather much  Social Connections: Moderately Isolated (06/22/2023)   Social Connection and Isolation Panel [NHANES]    Frequency of Communication with Friends and Family: More than three times a week    Frequency of Social Gatherings with Friends and Family: Three times a week    Attends Religious Services: Never    Active Member of Clubs or Organizations: No    Attends Banker Meetings: Never    Marital Status: Living with partner    Objective:  BP 90/64 (BP Location: Left Arm, Patient Position: Sitting, Cuff  Size: Normal)   Pulse 88   Temp 98.2 F (36.8 C) (Temporal)   Resp 16   Ht 5' 2 (1.575 m)   Wt 137 lb 3.2 oz (62.2 kg)   LMP 07/05/2023   SpO2 98%   BMI 25.09 kg/m      07/13/2023    9:47 AM 07/13/2023    8:41 AM 07/03/2023   11:18 AM  BP/Weight  Systolic BP 110 90   Diastolic BP 78 64   Wt. (Lbs) 136 137.2 136  BMI 24.87 kg/m2 25.09 kg/m2 24.87 kg/m2    Physical Exam Constitutional:  General: She is not in acute distress.    Appearance: Normal appearance. She is not ill-appearing.  Eyes:     Conjunctiva/sclera: Conjunctivae normal.  Neck:     Vascular: No carotid bruit.  Cardiovascular:     Rate and Rhythm: Normal rate and regular rhythm.     Heart sounds: Normal heart sounds.  Pulmonary:     Effort: Pulmonary effort is normal.     Breath sounds: Normal breath sounds.  Abdominal:     General: Bowel sounds are normal.     Palpations: Abdomen is soft.  Neurological:     Mental Status: She is alert. Mental status is at baseline.  Psychiatric:        Mood and Affect: Mood normal.        Behavior: Behavior normal.     Lab Results  Component Value Date   WBC 7.9 06/18/2023   HGB 14.0 06/18/2023   HCT 42.0 06/18/2023   PLT 314 06/18/2023   GLUCOSE 101 (H) 06/18/2023   ALT 11 04/16/2023   AST 12 04/16/2023   NA 139 06/18/2023   K 3.6 06/18/2023   CL 105 06/18/2023   CREATININE 0.69 06/18/2023   BUN 7 06/18/2023   CO2 23 06/18/2023   TSH 1.34 04/16/2023   HGBA1C 5.1 06/22/2023      Assessment & Plan:    Urinary incontinence without sensory awareness Assessment & Plan: UA - negative UTI    Orders: -     POCT URINALYSIS DIP (CLINITEK)     No orders of the defined types were placed in this encounter.   Orders Placed This Encounter  Procedures   POCT URINALYSIS DIP (CLINITEK)     Follow-up: Return if symptoms worsen or fail to improve.  An After Visit Summary was printed and given to the patient.  Harrie Cedar, FNP [facility] [phone]

## 2023-07-14 DIAGNOSIS — N3942 Incontinence without sensory awareness: Secondary | ICD-10-CM | POA: Insufficient documentation

## 2023-07-14 DIAGNOSIS — N390 Urinary tract infection, site not specified: Secondary | ICD-10-CM | POA: Insufficient documentation

## 2023-07-14 NOTE — Assessment & Plan Note (Signed)
 UA - negative UTI

## 2023-07-16 ENCOUNTER — Ambulatory Visit
Admission: RE | Admit: 2023-07-16 | Discharge: 2023-07-16 | Disposition: A | Discharge status: Home / Self Care | Payer: Medicaid Other | Source: Ambulatory Visit | Attending: Neurology

## 2023-07-16 DIAGNOSIS — R531 Weakness: Secondary | ICD-10-CM

## 2023-07-16 DIAGNOSIS — M50222 Other cervical disc displacement at C5-C6 level: Secondary | ICD-10-CM | POA: Diagnosis not present

## 2023-07-16 DIAGNOSIS — M542 Cervicalgia: Secondary | ICD-10-CM

## 2023-07-16 DIAGNOSIS — M5021 Other cervical disc displacement,  high cervical region: Secondary | ICD-10-CM | POA: Diagnosis not present

## 2023-07-16 DIAGNOSIS — M50221 Other cervical disc displacement at C4-C5 level: Secondary | ICD-10-CM | POA: Diagnosis not present

## 2023-07-16 DIAGNOSIS — M50223 Other cervical disc displacement at C6-C7 level: Secondary | ICD-10-CM | POA: Diagnosis not present

## 2023-07-16 DIAGNOSIS — R202 Paresthesia of skin: Secondary | ICD-10-CM

## 2023-07-20 DIAGNOSIS — F411 Generalized anxiety disorder: Secondary | ICD-10-CM | POA: Diagnosis not present

## 2023-07-23 ENCOUNTER — Telehealth: Payer: Self-pay

## 2023-07-23 ENCOUNTER — Ambulatory Visit: Payer: Medicaid Other | Admitting: Neurology

## 2023-07-23 DIAGNOSIS — R531 Weakness: Secondary | ICD-10-CM

## 2023-07-23 DIAGNOSIS — R202 Paresthesia of skin: Secondary | ICD-10-CM

## 2023-07-23 NOTE — Telephone Encounter (Signed)
Pt called an informed that EMG was normal

## 2023-07-23 NOTE — Telephone Encounter (Signed)
Pt called an informed that  MRI cervical spine looks good

## 2023-07-23 NOTE — Procedures (Signed)
[facility]  [address] [place], [address]  [place], [place] [address] Tel: [phone] Fax: [phone] Test Date:  07/23/2023  Patient: Tricia Allen DOB: 04-04-1996 Physician: Jacquelyne Balint, MD  Sex: Female Height: 5\' 2"  Ref Phys: Patrcia Dolly, MD  ID#: [id number]   Technician:    History: This is a 28 year old female with right sided weakness.  NCV & EMG Findings: Extensive electrodiagnostic evaluation of the right upper and lower limbs show: Right sural, superficial peroneal/fibular, median, ulnar, and radial sensory responses are within normal limits. Right peroneal/fibular (EDB), tibial (AH), median (APB), and ulnar (ADM) motor responses are within normal limits. Right H reflex latency is within normal limits. There is no evidence of active or chronic motor axon loss changes affecting any of the tested muscles on needle examination. Motor unit configuration and recruitment pattern is within normal limits.  Impression: This is a normal study. Specifically: No electrodiagnostic evidence of a large fiber sensorimotor neuropathy. No electrodiagnostic evidence of myopathy. No electrodiagnostic evidence of a right cervical (C5-C8) or lumbosacral (L3-S1) motor radiculopathy. No electrodiagnostic evidence of a right median mononeuropathy at or distal to the wrist (ie: carpal tunnel syndrome). Screening studies for right ulnar and radial mononeuropathies are normal.    ___________________________ Jacquelyne Balint, MD    Nerve Conduction Studies Motor Nerve Results    Latency Amplitude F-Lat Segment Distance CV Comment  Site (ms) Norm (mV) Norm (ms)  (cm) (m/s) Norm   Right Fibular (EDB) Motor  Ankle 3.0  < 5.5 6.4  > 3.0        Bel fib head 8.6 - 5.6 -  Bel fib head-Ankle 27 48  > 41   Pop fossa 10.3 - 5.3 -  Pop fossa-Bel fib head 9 53 -   Right Median (APB) Motor  Wrist 2.3  < 3.9 11.5  > 6.0        Elbow 6.2 - 11.4 -  Elbow-Wrist 25.5 65  > 51   Right Tibial  (AH) Motor  Ankle 2.8  < 5.8 18.2  > 8.0        Knee 9.3 - 15.4 -  Knee-Ankle 37 57  > 41   Right Ulnar (ADM) Motor  Wrist 1.75  < 3.0 13.2  > 8.0        Bel elbow 4.7 - 12.1 -  Bel elbow-Wrist 19 63  > 51   Ab elbow 6.6 - 11.4 -  Ab elbow-Bel elbow 10 53 -    Sensory Sites    Neg Peak Lat Amplitude (O-P) Segment Distance Velocity Comment  Site (ms) Norm (V) Norm  (cm) (ms)   Right Median Sensory  Wrist-Dig II 2.7  < 3.3 76  > 20 Wrist-Dig II 13    Right Radial Sensory  Forearm-Wrist 2.1  < 2.7 42  > 18 Forearm-Wrist 10    Right Superficial Fibular Sensory  14 cm-Ankle 2.4  < 4.4 17  > 6 14 cm-Ankle 14    Right Sural Sensory  Calf-Lat mall 3.5  < 4.4 13  > 6 Calf-Lat mall 14    Right Ulnar Sensory  Wrist-Dig V 2.6  < 3.3 66  > 18 Wrist-Dig V 11     H-Reflex Results    M-Lat H Lat H Neg Amp H-M Lat  Site (ms) (ms) Norm (mV) (ms)  Right Tibial H-Reflex  Pop fossa 4.8 25.4  < 35.0 2.9 20.6   Electromyography   Side Muscle Ins.Act Fibs Fasc  Recrt Amp Dur Poly Activation Comment  Right Tib ant Nml Nml Nml Nml Nml Nml Nml Nml N/A  Right Gastroc MH Nml Nml Nml Nml Nml Nml Nml Nml N/A  Right FDL Nml Nml Nml Nml Nml Nml Nml Nml N/A  Right Rectus fem Nml Nml Nml Nml Nml Nml Nml Nml N/A  Right Biceps fem SH Nml Nml Nml Nml Nml Nml Nml Nml N/A  Right Gluteus med Nml Nml Nml Nml Nml Nml Nml Nml N/A  Right FDI Nml Nml Nml Nml Nml Nml Nml Nml N/A  Right EIP Nml Nml Nml Nml Nml Nml Nml Nml N/A  Right Pronator teres Nml Nml Nml Nml Nml Nml Nml Nml N/A  Right Biceps Nml Nml Nml Nml Nml Nml Nml Nml N/A  Right Triceps lat hd Nml Nml Nml Nml Nml Nml Nml Nml N/A  Right Deltoid Nml Nml Nml Nml Nml Nml Nml Nml N/A      Waveforms:  Motor           Sensory             H-Reflex

## 2023-07-23 NOTE — Progress Notes (Signed)
Patient not available at 10:13 002/17/2025

## 2023-07-23 NOTE — Telephone Encounter (Signed)
-----   Message from Purty Rock Tat sent at 07/23/2023  9:06 AM EST ----- You can let pt know that MRI cervical spine looks good

## 2023-07-23 NOTE — Telephone Encounter (Signed)
-----   Message from Tricia Allen sent at 07/23/2023 11:15 AM EST ----- Can we let patient know her EMG was normal?

## 2023-07-24 ENCOUNTER — Ambulatory Visit (HOSPITAL_BASED_OUTPATIENT_CLINIC_OR_DEPARTMENT_OTHER)
Admission: RE | Admit: 2023-07-24 | Discharge: 2023-07-24 | Disposition: A | Discharge status: Home / Self Care | Payer: Medicaid Other | Source: Ambulatory Visit | Attending: Family Medicine | Admitting: Family Medicine

## 2023-07-24 ENCOUNTER — Encounter (HOSPITAL_BASED_OUTPATIENT_CLINIC_OR_DEPARTMENT_OTHER): Payer: Self-pay

## 2023-07-24 ENCOUNTER — Other Ambulatory Visit (HOSPITAL_BASED_OUTPATIENT_CLINIC_OR_DEPARTMENT_OTHER): Payer: Self-pay

## 2023-07-24 ENCOUNTER — Encounter: Payer: Self-pay | Admitting: Advanced Practice Midwife

## 2023-07-24 VITALS — BP 109/77 | HR 96 | Temp 98.9°F | Resp 16 | Ht 62.0 in | Wt 137.0 lb

## 2023-07-24 DIAGNOSIS — R110 Nausea: Secondary | ICD-10-CM | POA: Diagnosis not present

## 2023-07-24 DIAGNOSIS — R102 Pelvic and perineal pain: Secondary | ICD-10-CM | POA: Diagnosis not present

## 2023-07-24 DIAGNOSIS — R051 Acute cough: Secondary | ICD-10-CM | POA: Insufficient documentation

## 2023-07-24 LAB — POCT URINALYSIS DIP (MANUAL ENTRY)
Bilirubin, UA: NEGATIVE
Blood, UA: NEGATIVE
Glucose, UA: NEGATIVE mg/dL
Ketones, POC UA: NEGATIVE mg/dL
Leukocytes, UA: NEGATIVE
Nitrite, UA: NEGATIVE
Protein Ur, POC: NEGATIVE mg/dL
Spec Grav, UA: <=1.005 — AB
Urobilinogen, UA: 0.2 U/dL
pH, UA: 5.5

## 2023-07-24 LAB — POC COVID19/FLU A&B COMBO
Covid Antigen, POC: NEGATIVE
Influenza A Antigen, POC: NEGATIVE
Influenza B Antigen, POC: NEGATIVE

## 2023-07-24 MED ORDER — ONDANSETRON 4 MG PO TBDP
4.0000 mg | ORAL_TABLET | Freq: Three times a day (TID) | ORAL | 0 refills | Status: DC | PRN
  Filled 2023-07-24: qty 20, 7d supply, fill #0

## 2023-07-24 NOTE — Discharge Instructions (Addendum)
Rapid flu and COVID is negative.  Urinalysis was essentially normal.  Given her urinary symptoms, will send a culture.  No antibiotics for now.  If urine culture is abnormal we will contact the patient and adjust her plan of care.  If urine culture is negative will be available on the portal.  Get plenty of fluids and rest.  Ondansetron ODT, 4 mg, every 8 hours, if needed for nausea and vomiting.  Follow-up if symptoms do not improve, worsen or new symptoms occur.

## 2023-07-24 NOTE — ED Triage Notes (Signed)
Patient c/o possible UTI, abdominal pain, chest tightness w/cough x 2 days.  Loose stools and urinary urgency.  Denies any blood.  Denies any OTC meds.

## 2023-07-24 NOTE — ED Provider Notes (Signed)
[name]    CSN: [id number] Arrival date & time: 07/24/23  1217      History   Chief Complaint No chief complaint on file.   HPI [place] Fox is a 28 y.o. female.   The patient's son was diagnosed with influenza type a on 07/20/23.  The patient has had some lower abdominal pain and concerned she might have a UTI due to urinary urgency.  She has had some loose stools.  She has not seen any blood in her urine.  She has had some cough and some chest congestion for the last 2 days.  Symptoms started for her on 07/22/2023.     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   Dizziness 04/16/2023   Numbness and tingling of right leg 04/12/2023   Acute pain of left knee 04/12/2023   Myalgia 04/12/2023   Fatigue due to depression 04/12/2023   GAD (generalized anxiety disorder) 04/12/2023   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 Authorizing Provider  NON FORMULARY Take 200 mg by mouth daily.  L- Theamine   Yes [provider]  NON FORMULARY Take 150 mg by mouth once. Ashwagdha   Yes [provider]  ondansetron (ZOFRAN-ODT) 4 MG disintegrating tablet Take 1 tablet (4 mg total) by mouth every 8 (eight) hours as needed for nausea or vomiting. 07/24/23  Yes Prescilla Sours, FNP    Family History Family History  Problem Relation Age of Onset   Healthy Mother    Asthma Father    Hypertension Father    Heart disease Paternal Grandmother    Cancer Paternal Grandmother    Diabetes Paternal Grandfather     Social History Social History   Tobacco Use   Smoking status: Never   Smokeless tobacco: Never  Vaping Use   Vaping status: Never Used  Substance Use Topics   Alcohol use: Not Currently   Drug use: Not Currently     Allergies   Codeine and Codeine   Review of Systems Review of Systems  Constitutional:  Negative for chills and fever.  HENT:  Positive for congestion. Negative for ear pain and sore throat.   Eyes:  Negative for pain and visual disturbance.  Respiratory:  Positive  for cough. Negative for shortness of breath.   Cardiovascular:  Negative for chest pain and palpitations.  Gastrointestinal:  Positive for abdominal pain. Negative for constipation, diarrhea, nausea and vomiting.  Genitourinary:  Positive for urgency. Negative for dysuria, frequency and hematuria.  Musculoskeletal:  Negative for arthralgias and back pain.  Skin:  Negative for color change and rash.  Neurological:  Negative for seizures and syncope.  All other systems reviewed and are negative.    Physical Exam Triage Vital Signs ED Triage Vitals  Encounter Vitals Group     BP 07/24/23 1246 109/77     Systolic BP Percentile --      Diastolic BP Percentile --      Pulse Rate 07/24/23 1246 96     Resp 07/24/23 1246 16     Temp 07/24/23 1246 98.9 F (37.2 C)     Temp Source 07/24/23 1246 Oral     SpO2 07/24/23 1246 96 %     Weight 07/24/23 1247 137 lb (62.1 kg)      Height 07/24/23 1247 5\' 2"  (1.575 m)     Head Circumference --      Peak Flow --      Pain Score 07/24/23 1247 0     Pain Loc --      Pain Education --      Exclude from Growth Chart --    No data found.  Updated Vital Signs BP 109/77 (BP Location: Right Arm)   Pulse 96   Temp 98.9 F (37.2 C) (Oral)   Resp 16   Ht 5\' 2"  (1.575 m)   Wt 137 lb (62.1 kg)   LMP 07/05/2023   SpO2 96%   Breastfeeding No   BMI 25.06 kg/m   Visual Acuity Right Eye Distance:   Left Eye Distance:   Bilateral Distance:    Right Eye Near:   Left Eye Near:    Bilateral Near:     Physical Exam Vitals and nursing note reviewed.  Constitutional:      General: She is not in acute distress.    Appearance: She is well-developed. She is not ill-appearing or toxic-appearing.  HENT:     Head: Normocephalic and atraumatic.     Right Ear: Hearing, tympanic membrane, ear canal and external ear normal.     Left Ear: Hearing, tympanic membrane, ear canal and external ear normal.     Nose: Congestion and rhinorrhea present. Rhinorrhea is clear.     Right Sinus: No maxillary sinus tenderness or frontal sinus tenderness.     Left Sinus: No maxillary sinus tenderness or frontal sinus tenderness.     Mouth/Throat:     Lips: Pink.     Mouth: Mucous membranes are moist.     Pharynx: Uvula midline. No oropharyngeal exudate or posterior oropharyngeal erythema.     Tonsils: No tonsillar exudate.  Eyes:     Conjunctiva/sclera: Conjunctivae normal.     Pupils: Pupils are equal, round, and reactive to light.  Cardiovascular:     Rate and Rhythm: Normal rate and regular rhythm.     Heart sounds: S1 normal and S2 normal. No murmur heard. Pulmonary:     Effort: Pulmonary effort is normal. No respiratory distress.     Breath sounds: Normal breath sounds. No decreased breath sounds, wheezing, rhonchi or rales.  Abdominal:     Palpations: Abdomen is soft.     Tenderness: There is abdominal tenderness (Mild) in the  suprapubic area.  Musculoskeletal:  General: No swelling.     Cervical back: Neck supple.  Lymphadenopathy:     Head:     Right side of head: No submental, submandibular, tonsillar, preauricular or posterior auricular adenopathy.     Left side of head: No submental, submandibular, tonsillar, preauricular or posterior auricular adenopathy.     Cervical: No cervical adenopathy.     Right cervical: No superficial cervical adenopathy.    Left cervical: No superficial cervical adenopathy.  Skin:    General: Skin is warm and dry.     Capillary Refill: Capillary refill takes less than 2 seconds.     Findings: No rash.  Neurological:     Mental Status: She is alert and oriented to person, place, and time.  Psychiatric:        Mood and Affect: Mood normal.      UC Treatments / Results  Labs (all labs ordered are listed, but only abnormal results are displayed) Labs Reviewed  POCT URINALYSIS DIP (MANUAL ENTRY) - Abnormal; Notable for the following components:      Result Value   Color, UA light yellow (*)    Spec Grav, UA <=1.005 (*)    All other components within normal limits  POC COVID19/FLU A&B COMBO - Normal  URINE CULTURE    EKG   Radiology   Procedures Procedures (including critical care time)  Medications Ordered in UC Medications - No data to display  Initial Impression / Assessment and Plan / UC Course  I have reviewed the triage vital signs and the nursing notes.  Pertinent labs & imaging results that were available during my care of the patient were reviewed by me and considered in my medical decision making (see chart for details).     UA is essentially normal.  Given her symptoms, we will send a urine culture.  Will adjust the plan of care, if needed once the urine culture results.  Rapid flu and COVID are negative.  Get plenty of fluids and rest.  Ondansetron ODT, 4 mg, every 8 hours, if needed for nausea or vomiting.  Work excuse provided.   Follow-up if symptoms do not improve, worsen or new symptoms occur. Final Clinical Impressions(s) / UC Diagnoses   Final diagnoses:  Acute cough  Suprapubic pain, acute  Nausea without vomiting     Discharge Instructions      Rapid flu and COVID is negative.  Urinalysis was essentially normal.  Given her urinary symptoms, will send a culture.  No antibiotics for now.  If urine culture is abnormal we will contact the patient and adjust her plan of care.  If urine culture is negative will be available on the portal.  Get plenty of fluids and rest.  Ondansetron ODT, 4 mg, every 8 hours, if needed for nausea and vomiting.  Follow-up if symptoms do not improve, worsen or new symptoms occur.     ED Prescriptions     Medication Sig Dispense Auth. Provider   ondansetron (ZOFRAN-ODT) 4 MG disintegrating tablet Take 1 tablet (4 mg total) by mouth every 8 (eight) hours as needed for nausea or vomiting. 20 tablet Prescilla Sours, FNP      PDMP not reviewed this encounter.   Prescilla Sours, FNP 07/24/23 1346

## 2023-07-25 ENCOUNTER — Telehealth: Payer: Medicaid Other | Admitting: Physician Assistant

## 2023-07-25 DIAGNOSIS — N390 Urinary tract infection, site not specified: Secondary | ICD-10-CM

## 2023-07-25 DIAGNOSIS — F419 Anxiety disorder, unspecified: Secondary | ICD-10-CM | POA: Diagnosis not present

## 2023-07-25 LAB — URINE CULTURE: Culture: 20000 — AB

## 2023-07-25 MED ORDER — AMOXICILLIN 500 MG PO CAPS
500.0000 mg | ORAL_CAPSULE | Freq: Two times a day (BID) | ORAL | 0 refills | Status: AC
  Filled 2023-07-26: qty 14, 7d supply, fill #0

## 2023-07-25 NOTE — Patient Instructions (Signed)
  Tricia Allen, thank you for joining Tricia Better, PA-C for today's virtual visit.  While this provider is not your primary care provider (PCP), if your PCP is located in our provider database this encounter information will be shared with them immediately following your visit.   A [facility] MyChart account gives you access to today's visit and all your visits, tests, and labs performed at [facility] " click here if you don't have a [place] [facility] MyChart account or go to [email or web address]  Consent: (Patient) Tricia Allen provided verbal consent for this virtual visit at the beginning of the encounter.  Current Medications:  Current Outpatient Medications:    amoxicillin (AMOXIL) 500 MG capsule, Take 1 capsule (500 mg total) by mouth 2 (two) times daily for 7 days., Disp: 14 capsule, Rfl: 0   NON FORMULARY, Take 200 mg by mouth daily. L- Theamine, Disp: , Rfl:    NON FORMULARY, Take 150 mg by mouth once. Ashwagdha, Disp: , Rfl:    ondansetron (ZOFRAN-ODT) 4 MG disintegrating tablet, Take 1 tablet (4 mg total) by mouth every 8 (eight) hours as needed for nausea or vomiting., Disp: 20 tablet, Rfl: 0   Medications ordered in this encounter:  Meds ordered this encounter  Medications   amoxicillin (AMOXIL) 500 MG capsule    Sig: Take 1 capsule (500 mg total) by mouth 2 (two) times daily for 7 days.    Dispense:  14 capsule    Refill:  0    Supervising Provider:   Merrilee Allen [[id number]]     *If you need refills on other medications prior to your next appointment, please contact your pharmacy*  Follow-Up: Call back or seek an in-person evaluation if the symptoms worsen or if the condition fails to improve as anticipated.  [facility] [phone]  Other Instructions Reviewed abnormal urine culture result with patient.She verbalized understanding. Stay well hydrated Start Amoxicillin as prescribed.  Continue to watch for worsening  symptoms. Follow up with PCP for anxiety-like symptoms.   If you have been instructed to have an in-person evaluation today at a local Urgent Care facility, please use the link below. It will take you to a list of all of our available [facility], including address, phone number and hours of operation. Please do not delay care.  [facility]  If you or a family member do not have a primary care provider, use the link below to schedule a visit and establish care. When you choose a [facility] primary care physician or advanced practice provider, you gain a long-term partner in health. Find a Primary Care Provider  Learn more about [facility]'s in-office and virtual care options: [facility] - Get Care Now

## 2023-07-25 NOTE — Progress Notes (Signed)
Virtual Visit Consent   [name], you are scheduled for a virtual visit with a [facility] provider today. Just as with appointments in the office, your consent must be obtained to participate. Your consent will be active for this visit and any virtual visit you may have with one of our providers in the next 365 days. If you have a MyChart account, a copy of this consent can be sent to you electronically.  As this is a virtual visit, video technology does not allow for your provider to perform a traditional examination. This may limit your provider's ability to fully assess your condition. If your provider identifies any concerns that need to be evaluated in person or the need to arrange testing (such as labs, EKG, etc.), we will make arrangements to do so. Although advances in technology are sophisticated, we cannot ensure that it will always work on either your end or our end. If the connection with a video visit is poor, the visit may have to be switched to a telephone visit. With either a video or telephone visit, we are not always able to ensure that we have a secure connection.  By engaging in this virtual visit, you consent to the provision of healthcare and authorize for your insurance to be billed (if applicable) for the services provided during this visit. Depending on your insurance coverage, you may receive a charge related to this service.  I need to obtain your verbal consent now. Are you willing to proceed with your visit today? [name] has provided verbal consent on 07/25/2023 for a virtual visit (video or telephone). Tricia Allen, [place]  Date: 07/25/2023 8:00 PM   Virtual Visit via Video Note   I, Tricia Allen, connected with  [place] [facility]  ([id number], [birth year]) on 07/25/23 at  7:45 PM EST by a video-enabled telemedicine application and verified that I am speaking with the correct person using two identifiers.  Location: Patient: Virtual Visit  Location Patient: Home Provider: Virtual Visit Location Provider: Home Office   I discussed the limitations of evaluation and management by telemedicine and the availability of in person appointments. The patient expressed understanding and agreed to proceed.    History of Present Illness: Tricia Allen is a 28 y.o. who identifies as a female who was assigned female at birth, and is being seen today for test results.  HPI: 28 y/o F presents via video telehealth visit for reviewing lab results from yesterday's UC visit. Tested negative for Covid and Flu. Also c/o chest tightness, but has h/o anxiety. Not currently under stress.     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 L- Theamine, Disp: , Rfl:    NON FORMULARY, Take 150 mg by mouth once. Ashwagdha,  Disp: , Rfl:    ondansetron (ZOFRAN-ODT) 4 MG disintegrating tablet, Take 1 tablet (4 mg total) by mouth every 8 (eight) hours as needed for nausea or vomiting., Disp: 20 tablet, Rfl: 0  Observations/Objective: Patient is well-developed, well-nourished in no acute distress.  Resting comfortably  at home.  Head is normocephalic, atraumatic.  No labored breathing.  Speech is clear and coherent with logical content.  Patient is alert and oriented at baseline.    Assessment and Plan: 1. Urinary tract infection without hematuria, site unspecified (Primary) - amoxicillin (AMOXIL) 500 MG capsule; Take 1 capsule (500 mg total) by mouth 2 (two) times daily for 7 days.  Dispense: 14 capsule; Refill: 0  2. Anxiety  Reviewed abnormal urine culture result with patient.She verbalized understanding. Stay well hydrated Start Amoxicillin as prescribed.  Continue to watch for worsening symptoms. Follow up with PCP for anxiety-like symptoms. Pt verbalized understanding and in agreement.     Follow Up Instructions: I discussed the assessment and treatment plan with the patient. The patient was provided an opportunity to ask questions and all were answered. The patient agreed with the plan and demonstrated an understanding of the instructions.  A copy of instructions were sent to the patient via MyChart unless otherwise noted below.   Patient has requested to receive PHI (AVS, Work Notes, etc) pertaining to this video visit through e-mail as they are currently without active MyChart. They have voiced understand that email is not considered secure and their health information could be viewed by someone other than the patient.   The patient was advised to call back or seek an in-person evaluation if the symptoms worsen or if the condition fails to improve as anticipated.    Tricia Better, PA-C

## 2023-07-26 ENCOUNTER — Encounter: Payer: Self-pay | Admitting: Advanced Practice Midwife

## 2023-07-26 ENCOUNTER — Other Ambulatory Visit: Payer: Self-pay

## 2023-07-26 ENCOUNTER — Other Ambulatory Visit (HOSPITAL_BASED_OUTPATIENT_CLINIC_OR_DEPARTMENT_OTHER): Payer: Self-pay

## 2023-07-26 DIAGNOSIS — E559 Vitamin D deficiency, unspecified: Secondary | ICD-10-CM

## 2023-07-26 DIAGNOSIS — D649 Anemia, unspecified: Secondary | ICD-10-CM

## 2023-07-27 ENCOUNTER — Ambulatory Visit (HOSPITAL_BASED_OUTPATIENT_CLINIC_OR_DEPARTMENT_OTHER)
Admission: RE | Admit: 2023-07-27 | Discharge: 2023-07-27 | Disposition: A | Discharge status: Home / Self Care | Payer: Medicaid Other | Source: Ambulatory Visit | Attending: Family Medicine | Admitting: Family Medicine

## 2023-07-27 ENCOUNTER — Ambulatory Visit (HOSPITAL_BASED_OUTPATIENT_CLINIC_OR_DEPARTMENT_OTHER): Payer: Self-pay

## 2023-07-27 ENCOUNTER — Encounter (HOSPITAL_BASED_OUTPATIENT_CLINIC_OR_DEPARTMENT_OTHER): Payer: Self-pay

## 2023-07-27 ENCOUNTER — Ambulatory Visit (HOSPITAL_BASED_OUTPATIENT_CLINIC_OR_DEPARTMENT_OTHER)
Admit: 2023-07-27 | Discharge: 2023-07-27 | Disposition: A | Discharge status: Home / Self Care | Payer: Medicaid Other | Attending: Family Medicine | Admitting: Family Medicine

## 2023-07-27 ENCOUNTER — Other Ambulatory Visit: Payer: Medicaid Other

## 2023-07-27 VITALS — BP 112/75 | HR 84 | Temp 98.6°F | Resp 16

## 2023-07-27 DIAGNOSIS — R350 Frequency of micturition: Secondary | ICD-10-CM

## 2023-07-27 DIAGNOSIS — B951 Streptococcus, group B, as the cause of diseases classified elsewhere: Secondary | ICD-10-CM | POA: Diagnosis not present

## 2023-07-27 DIAGNOSIS — R1031 Right lower quadrant pain: Secondary | ICD-10-CM | POA: Diagnosis not present

## 2023-07-27 DIAGNOSIS — K5900 Constipation, unspecified: Secondary | ICD-10-CM | POA: Diagnosis not present

## 2023-07-27 DIAGNOSIS — E559 Vitamin D deficiency, unspecified: Secondary | ICD-10-CM

## 2023-07-27 DIAGNOSIS — R1011 Right upper quadrant pain: Secondary | ICD-10-CM | POA: Diagnosis not present

## 2023-07-27 DIAGNOSIS — R102 Pelvic and perineal pain: Secondary | ICD-10-CM | POA: Diagnosis not present

## 2023-07-27 DIAGNOSIS — D649 Anemia, unspecified: Secondary | ICD-10-CM

## 2023-07-27 DIAGNOSIS — Z87442 Personal history of urinary calculi: Secondary | ICD-10-CM

## 2023-07-27 DIAGNOSIS — N390 Urinary tract infection, site not specified: Secondary | ICD-10-CM | POA: Diagnosis not present

## 2023-07-27 LAB — POCT URINALYSIS DIP (MANUAL ENTRY)
Bilirubin, UA: NEGATIVE
Blood, UA: NEGATIVE
Glucose, UA: NEGATIVE mg/dL
Ketones, POC UA: NEGATIVE mg/dL
Leukocytes, UA: NEGATIVE
Nitrite, UA: NEGATIVE
Protein Ur, POC: NEGATIVE mg/dL
Spec Grav, UA: 1.01
Urobilinogen, UA: 0.2 U/dL
pH, UA: 5.5

## 2023-07-27 NOTE — Discharge Instructions (Addendum)
Urinalysis is normal and no blood noted.  Abdominal x-ray is showing lots of stool in the colon and gas but no kidney stones.  It appears she is constipated based on the x-ray.  Radiology will review the films and I will contact her if they get a different result than what I have just stated.  No stones seen.  Get plenty of fluids.  Try to get empty of stool.  Discussed methods to improve or resolve constipation.  If symptoms get dramatically worse go to an emergency room where they can do a CT scan.  If symptoms persist or do not resolve see your family practice or see a gastroenterologist.  Constipation Recipe:  Mix together: ? 1 cup apple sauce ? 1 cup oat bran or unprocessed wheat bran ?  cup prune juice This recipe helps to increase dietary fiber intake and promotes regular bowel function. You may have a bloated feeling and more gas when adding fiber to your diet, but this should pass in a few weeks. Begin with 1-2 tablespoons each evening mixed with or followed by one 6-8 ounce cup of water or juice. After two weeks you will have softer and more regular bowel movements. If no change occurs, slowly increase the amount to 3-4 tablespoons. Plan to make this part of your daily routine for the rest of your lifetime.  You can store the mixture in your refrigerator or freezer. You can also freeze 1-2 tablespoon servings in sectioned ice cube trays or in foam plastic egg cartons and thaw as needed.

## 2023-07-27 NOTE — ED Provider Notes (Signed)
[facility]    CSN: [id number] Arrival date & time: 07/27/23  1559      History   Chief Complaint Chief Complaint  Patient presents with   Abdominal Pain    Came Wednesday for uti. Prescribed antibiotics. Symptoms in my lower stomach are worse. - Entered by patient    HPI Tricia Allen is a 28 y.o. female.   The patient was here on 07/24/2023.  She had a cough and suprapubic abdominal pain.  She had negative flu and COVID testing.  Her urinalysis was slightly abnormal but not clearly showing infection.  A urine culture was sent.  Urine culture showed a low colony count (less than 20,000) culture with group B strep.  Group B strep is considered a vaginal contaminant and she had just had her menstrual cycle.  Based on protocol she was not treated for UTI.  However she had a video visit on 07/25/2023 and the provider on the video visit gave her antibiotics based on the culture.  She is currently on amoxicillin, 500 mg, 3 times daily that was started on 07/25/2023.  She is returning today with persistent abdominal pain that feels worse and she is now having some nausea, when the pain is bad.  She does remember being told that she had a kidney stone in her teens.  Her menstrual cycle was last week and she stated no flow for over a week.   Abdominal Pain Associated symptoms: nausea   Associated symptoms: no chest pain, no chills, no constipation, no cough, no diarrhea, no dysuria, no fever, no hematuria, no shortness of breath, no sore throat and no vomiting     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   Dizziness 04/16/2023    Numbness and tingling of right leg 04/12/2023   Acute pain of left knee 04/12/2023   Myalgia 04/12/2023   Fatigue due to depression 04/12/2023   GAD (generalized anxiety disorder) 04/12/2023   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 Authorizing Provider  amoxicillin (AMOXIL) 500 MG capsule Take 1 capsule (500 mg total) by mouth 2 (two) times daily for 7 days. 07/25/23 08/02/23  [name], PA-C  NON FORMULARY Take 200 mg by mouth daily. L- Theamine  [provider]  NON FORMULARY Take 150 mg by mouth once. Ashwagdha    [provider]  ondansetron (ZOFRAN-ODT) 4 MG disintegrating tablet Take 1 tablet (4 mg total) by mouth every 8 (eight) hours as needed for nausea or vomiting. 07/24/23   Prescilla Sours, FNP    Family History Family History  Problem Relation Age of Onset   Healthy Mother    Asthma Father    Hypertension Father    Heart disease Paternal Grandmother    Cancer Paternal Grandmother    Diabetes Paternal Grandfather     Social History Social History   Tobacco Use   Smoking status: Never   Smokeless tobacco: Never  Vaping Use   Vaping status: Never Used  Substance Use Topics   Alcohol use: Not Currently   Drug use: Not Currently     Allergies   Codeine and Codeine   Review of Systems Review of Systems   Constitutional:  Negative for chills and fever.  HENT:  Negative for ear pain and sore throat.   Eyes:  Negative for pain and visual disturbance.  Respiratory:  Negative for cough and shortness of breath.   Cardiovascular:  Negative for chest pain and palpitations.  Gastrointestinal:  Positive for abdominal pain and nausea. Negative for constipation, diarrhea and vomiting.  Genitourinary:  Negative for dysuria and hematuria.  Musculoskeletal:  Negative for arthralgias and back pain.  Skin:  Negative for color change and rash.  Neurological:  Negative for seizures and syncope.  All other systems reviewed and are negative.    Physical Exam Triage Vital Signs ED Triage Vitals  Encounter Vitals Group     BP 07/27/23 1610 112/75     Systolic BP Percentile --      Diastolic BP Percentile --      Pulse Rate 07/27/23 1610 84     Resp 07/27/23 1610 16     Temp 07/27/23 1610 98.6 F (37 C)     Temp Source 07/27/23 1610 Oral     SpO2 07/27/23 1610 97 %     Weight --      Height --      Head Circumference --      Peak Flow --      Pain Score 07/27/23 1609 5     Pain Loc --      Pain Education --      Exclude from Growth Chart --    No data found.  Updated Vital Signs BP 112/75 (BP Location: Right Arm)   Pulse 84   Temp 98.6 F (37 C) (Oral)   Resp 16   LMP 07/05/2023   SpO2 97%   Visual Acuity Right Eye Distance:   Left Eye Distance:   Bilateral Distance:    Right Eye Near:   Left Eye Near:    Bilateral Near:     Physical Exam Vitals and nursing note reviewed.  Constitutional:      General: She is not in acute distress.    Appearance: She is well-developed. She is not ill-appearing or toxic-appearing.  HENT:     Head: Normocephalic and atraumatic.     Right Ear: Hearing, tympanic membrane, ear canal and external ear normal.     Left Ear: Hearing, tympanic membrane, ear canal and external ear normal.     Nose: Nose normal.     Mouth/Throat:     Lips: Pink.      Mouth: Mucous membranes are moist.  Pharynx: Uvula midline. No oropharyngeal exudate or posterior oropharyngeal erythema.     Tonsils: No tonsillar exudate.  Eyes:     Conjunctiva/sclera: Conjunctivae normal.     Pupils: Pupils are equal, round, and reactive to light.  Cardiovascular:     Rate and Rhythm: Normal rate and regular rhythm.     Heart sounds: S1 normal and S2 normal. No murmur heard. Pulmonary:     Effort: Pulmonary effort is normal. No respiratory distress.     Breath sounds: Normal breath sounds. No decreased breath sounds, wheezing, rhonchi or rales.  Abdominal:     Palpations: Abdomen is soft.     Tenderness: There is abdominal tenderness (Moderate) in the suprapubic area.  Musculoskeletal:        General: No swelling.     Cervical back: Neck supple.  Lymphadenopathy:     Head:     Right side of head: No submental, submandibular, tonsillar, preauricular or posterior auricular adenopathy.     Left side of head: No submental, submandibular, tonsillar, preauricular or posterior auricular adenopathy.     Cervical: No cervical adenopathy.     Right cervical: No superficial cervical adenopathy.    Left cervical: No superficial cervical adenopathy.  Skin:    General: Skin is warm and dry.     Capillary Refill: Capillary refill takes less than 2 seconds.     Findings: No rash.  Neurological:     Mental Status: She is alert and oriented to person, place, and time.  Psychiatric:        Mood and Affect: Mood normal.      UC Treatments / Results  Labs (all labs ordered are listed, but only abnormal results are displayed) Labs Reviewed  POCT URINALYSIS DIP (MANUAL ENTRY) - Normal    EKG   Radiology No results found.  Procedures Procedures (including critical care time)  Medications Ordered in UC Medications - No data to display  Initial Impression / Assessment and Plan / UC Course  I have reviewed the triage vital signs and the nursing  notes.  Pertinent labs & imaging results that were available during my care of the patient were reviewed by me and considered in my medical decision making (see chart for details).     Urinalysis is normal and no blood noted.  Abdominal x-ray showed: Some gas but no kidney stone.  It appears she is constipated based on the x-ray.  Radiology will review the films I will contact her if they get a different result stated.  Get plenty of fluids and rest.  Try to get empty of stool.  Discussed methods to get rid of stool or and constipation.  If symptoms persist may need to see her family doctor, she may need a CT scan.  If symptoms dramatically worsen go to an emergency room where they can do an emergency CT scan.  Follow-up if symptoms do not improve, worsen or new symptoms occur. Final Clinical Impressions(s) / UC Diagnoses   Final diagnoses:  Acute suprapubic pain  Right upper quadrant abdominal pain  Right lower quadrant abdominal pain  History of kidney stones  Urinary frequency  Group B streptococcal UTI  Constipation, unspecified constipation type     Discharge Instructions      Urinalysis is normal and no blood noted.  Abdominal x-ray is showing lots of stool in the colon and gas but no kidney stones.  It appears she is constipated based on the x-ray.  Radiology will review the  films and I will contact her if they get a different result than what I have just stated.  No stones seen.  Get plenty of fluids.  Try to get empty of stool.  Discussed methods to improve or resolve constipation.  If symptoms get dramatically worse go to an emergency room where they can do a CT scan.  If symptoms persist or do not resolve see your family practice or see a gastroenterologist.  Constipation Recipe:  Mix together: ? 1 cup apple sauce ? 1 cup oat bran or unprocessed wheat bran ?  cup prune juice This recipe helps to increase dietary fiber intake and promotes regular bowel function. You  may have a bloated feeling and more gas when adding fiber to your diet, but this should pass in a few weeks. Begin with 1-2 tablespoons each evening mixed with or followed by one 6-8 ounce cup of water or juice. After two weeks you will have softer and more regular bowel movements. If no change occurs, slowly increase the amount to 3-4 tablespoons. Plan to make this part of your daily routine for the rest of your lifetime.  You can store the mixture in your refrigerator or freezer. You can also freeze 1-2 tablespoon servings in sectioned ice cube trays or in foam plastic egg cartons and thaw as needed.      ED Prescriptions   None    PDMP not reviewed this encounter.   Prescilla Sours, FNP 07/27/23 1740

## 2023-07-27 NOTE — ED Triage Notes (Signed)
Pt reports lower abdominal pain symptoms are worse pt was seen on 02/18 for UTI. Pt is feeling nauseous.

## 2023-07-28 LAB — IRON,TIBC AND FERRITIN PANEL
Ferritin: 38 ng/mL (ref 15–150)
Iron Saturation: 45 % (ref 15–55)
Iron: 156 ug/dL (ref 27–159)
Total Iron Binding Capacity: 343 ug/dL (ref 250–450)
UIBC: 187 ug/dL (ref 131–425)

## 2023-07-28 LAB — VITAMIN D 25 HYDROXY (VIT D DEFICIENCY, FRACTURES): Vit D, 25-Hydroxy: 32.6 ng/mL (ref 30.0–100.0)

## 2023-07-28 NOTE — Progress Notes (Signed)
 Abdominal X-Ray IMPRESSION:  Unremarkable bowel gas pattern.  Patient updated via VM message

## 2023-08-02 ENCOUNTER — Other Ambulatory Visit: Payer: Medicaid Other

## 2023-08-03 DIAGNOSIS — F411 Generalized anxiety disorder: Secondary | ICD-10-CM | POA: Diagnosis not present

## 2023-08-04 DIAGNOSIS — Z419 Encounter for procedure for purposes other than remedying health state, unspecified: Secondary | ICD-10-CM | POA: Diagnosis not present

## 2023-08-09 ENCOUNTER — Other Ambulatory Visit (HOSPITAL_COMMUNITY)
Admission: RE | Admit: 2023-08-09 | Discharge: 2023-08-09 | Disposition: A | Discharge status: Home / Self Care | Source: Ambulatory Visit | Attending: Family Medicine | Admitting: Family Medicine

## 2023-08-09 ENCOUNTER — Ambulatory Visit: Payer: Medicaid Other | Admitting: Family Medicine

## 2023-08-09 VITALS — BP 114/69 | HR 86 | Ht 62.0 in | Wt 133.0 lb

## 2023-08-09 DIAGNOSIS — Z1151 Encounter for screening for human papillomavirus (HPV): Secondary | ICD-10-CM | POA: Diagnosis not present

## 2023-08-09 DIAGNOSIS — R42 Dizziness and giddiness: Secondary | ICD-10-CM | POA: Diagnosis not present

## 2023-08-09 DIAGNOSIS — Z01419 Encounter for gynecological examination (general) (routine) without abnormal findings: Secondary | ICD-10-CM | POA: Insufficient documentation

## 2023-08-09 DIAGNOSIS — Z0000 Encounter for general adult medical examination without abnormal findings: Secondary | ICD-10-CM

## 2023-08-09 NOTE — Progress Notes (Signed)
 ANNUAL EXAM Patient name: Tricia Allen MRN [id number]  Date of birth: 1995/07/29 Chief Complaint:   Annual Exam  History of Present Illness:   Tricia Allen is a 28 y.o.  [phone]  female  being seen today for a routine annual exam.  Current complaints: episodes of dizziness/lightheadedness 3-4 times a day with rapid heart rate and chest pressure. No nausea. Happens regardless of position. Appetite normal. No weight loss. Maybe some heat intolerance. Has had labs in the past - TSH, CBC, CMP all normal. Does have anxiety, currently untreated.  Patient's last menstrual period was 08/02/2023 (exact date).  Last pap due. Results were:  normal . H/O abnormal pap: yes Last mammogram: n/a     07/13/2023    8:40 AM 04/12/2023    3:48 PM 03/27/2022    9:26 AM  Depression screen PHQ 2/9  Decreased Interest 1 2 0  Down, Depressed, Hopeless 1 2 0  PHQ - 2 Score 2 4 0  Altered sleeping 0 1 0  Tired, decreased energy 1 2 2   Change in appetite 1 3 0  Feeling bad or failure about yourself  0 0 0  Trouble concentrating 1 3 0  Moving slowly or fidgety/restless 0 0 0  Suicidal thoughts 0 0 0  PHQ-9 Score 5 13 2   Difficult doing work/chores  Very difficult         07/13/2023    8:40 AM 04/12/2023    3:49 PM 03/27/2022    9:27 AM  GAD 7 : Generalized Anxiety Score  Nervous, Anxious, on Edge 2 3 0  Control/stop worrying 1 3 0  Worry too much - different things 2 3 0  Trouble relaxing 1 3 0  Restless 0 0 0  Easily annoyed or irritable 2 3 1   Afraid - awful might happen 1 2 0  Total GAD 7 Score 9 17 1   Anxiety Difficulty  Extremely difficult      Review of Systems:   Pertinent items are noted in HPI Denies any headaches, blurred vision, fatigue, shortness of breath, chest pain, abdominal pain, abnormal vaginal discharge/itching/odor/irritation, problems with periods, bowel movements, urination, or intercourse unless otherwise stated above. Pertinent History Reviewed:   Reviewed past medical,surgical, social and family history.  Reviewed problem list, medications and allergies. Physical Assessment:   Vitals:   08/09/23 0959  BP: 114/69  Pulse: 86  Weight: 133 lb (60.3 kg)  Height: 5\' 2"  (1.575 m)  Body mass index is 24.33 kg/m.        Physical Examination:   General appearance - well appearing, and in no distress  Mental status - alert, oriented to person, place, and time  Psych:  She has a normal mood and affect  Skin - warm and dry, normal color, no suspicious lesions noted  Chest - effort normal, all lung fields clear to auscultation bilaterally  Heart - normal rate and regular rhythm  Neck:  midline trachea, no thyromegaly or nodules  Breasts - breasts appear normal, no suspicious masses, no skin or nipple changes or axillary nodes  Abdomen - soft, nontender, nondistended, no masses or organomegaly  Pelvic - VULVA: normal appearing vulva with no masses, tenderness or lesions  VAGINA: normal appearing vagina with normal color and discharge, no lesions  CERVIX: normal appearing cervix without discharge or lesions, no CMT  Thin prep pap is done with HR HPV cotesting  UTERUS: uterus is felt to be normal size, shape, consistency and nontender  ADNEXA: No adnexal masses or tenderness noted.  Extremities:  No swelling or varicosities noted  Chaperone present for exam  Assessment & Plan:  1. Well woman exam with routine gynecological exam (Primary) - Cytology - PAP  2. Dizziness with tachycardia She has seen cardiology in the past and had a Zio patch last year. Also had TSH, CMP, CBC within the last 3months. I do not think that she needs to repeat the blood work - will reach out to cardiology about repeating the Zio.    No orders of the defined types were placed in this encounter.   Meds: No orders of the defined types were placed in this encounter.   Follow-up: No follow-ups on file.  Levie Heritage, DO 08/09/2023 11:17 AM

## 2023-08-14 ENCOUNTER — Other Ambulatory Visit (HOSPITAL_BASED_OUTPATIENT_CLINIC_OR_DEPARTMENT_OTHER): Payer: Self-pay

## 2023-08-14 DIAGNOSIS — R070 Pain in throat: Secondary | ICD-10-CM | POA: Diagnosis not present

## 2023-08-14 DIAGNOSIS — R6889 Other general symptoms and signs: Secondary | ICD-10-CM | POA: Diagnosis not present

## 2023-08-14 MED ORDER — VENTOLIN HFA 108 (90 BASE) MCG/ACT IN AERS
2.0000 | INHALATION_SPRAY | Freq: Four times a day (QID) | RESPIRATORY_TRACT | 0 refills | Status: DC | PRN
  Filled 2023-08-14: qty 18, 25d supply, fill #0

## 2023-08-15 LAB — CYTOLOGY - PAP
Chlamydia: NEGATIVE
Comment: NEGATIVE
Comment: NORMAL
Diagnosis: NEGATIVE
Neisseria Gonorrhea: NEGATIVE

## 2023-08-16 ENCOUNTER — Ambulatory Visit: Admitting: Family Medicine

## 2023-08-16 ENCOUNTER — Other Ambulatory Visit (HOSPITAL_BASED_OUTPATIENT_CLINIC_OR_DEPARTMENT_OTHER): Payer: Self-pay

## 2023-08-16 ENCOUNTER — Encounter: Payer: Self-pay | Admitting: Family Medicine

## 2023-08-16 VITALS — BP 100/60 | HR 113 | Temp 99.8°F | Resp 16 | Ht 62.0 in | Wt 133.4 lb

## 2023-08-16 DIAGNOSIS — R6889 Other general symptoms and signs: Secondary | ICD-10-CM | POA: Diagnosis not present

## 2023-08-16 DIAGNOSIS — J029 Acute pharyngitis, unspecified: Secondary | ICD-10-CM | POA: Diagnosis not present

## 2023-08-16 DIAGNOSIS — J0110 Acute frontal sinusitis, unspecified: Secondary | ICD-10-CM | POA: Insufficient documentation

## 2023-08-16 LAB — POCT RAPID STREP A (OFFICE): Rapid Strep A Screen: NEGATIVE

## 2023-08-16 LAB — POCT INFLUENZA A/B
Influenza A, POC: NEGATIVE
Influenza B, POC: NEGATIVE

## 2023-08-16 LAB — POC COVID19 BINAXNOW: SARS Coronavirus 2 Ag: NEGATIVE

## 2023-08-16 MED ORDER — AMOXICILLIN 875 MG PO TABS
875.0000 mg | ORAL_TABLET | Freq: Two times a day (BID) | ORAL | 0 refills | Status: AC
  Filled 2023-08-16: qty 20, 10d supply, fill #0

## 2023-08-16 NOTE — Assessment & Plan Note (Signed)
 Negative Flu and Covid. Negative strep test. Suspected bacterial sinusitis due to symptoms of frontal pain, fever, congestion and drainage. - Prescribe amoxicillin for potential bacterial sinusitis. - Advise increased fluid intake. - Recommend rest. - Continue antihistamine for nasal drainage. - Recommend Mucinex for congestion if needed. - Recommend Delsym for cough

## 2023-08-16 NOTE — Assessment & Plan Note (Signed)
 Strep test - negative - Try honey and lemon for sore throat - Gargle with warm salt water - Try hot tea to soothe the throat

## 2023-08-16 NOTE — Assessment & Plan Note (Signed)
 Advised on infection prevention and recovery practices. - Advise discarding toothbrush after antibiotics. - Emphasize good hand hygiene. - Provide work excuse note for absence on previous day and today. - Advise return to work on August 20, 2023. - Instruct to contact via MyChart if symptoms worsen or concerns arise.

## 2023-08-16 NOTE — Progress Notes (Signed)
 Subjective:  Patient ID: Tricia Allen, female    DOB: 30-Jun-1995  Age: 28 y.o. MRN: [id number]  Chief Complaint  Patient presents with   Sore Throat   Cough    Fever Body aches    Discussed the use of AI scribe software for clinical note transcription with the patient, who gave verbal consent to proceed.  HPI The patient presents with sore throat, body aches, and fever.  She began experiencing a sore throat on Saturday, described as feeling like 'knives were cutting' her throat when swallowing. By Monday, she developed body aches and fever. This morning, her fever recurred, accompanied by severe leg pain and tenderness in the back of her legs. The throat pain has since improved.  She has a non-productive cough and reports a sensation of a band around her head, with pain intensifying when bending over. No shortness of breath or ear pain is noted.  She experiences nausea and abdominal pain, with severe pain upon standing that subsides when lying down. No missed periods and regular bowel movements, although her stomach has been hurting.  No painful urination or urinary frequency, but her urine has appeared darker than usual, possibly due to dehydration.  She has a history of strep throat, having had it once as a baby and again a year or two ago, which she describes as a severe experience.      07/13/2023    8:40 AM 04/12/2023    3:48 PM 03/27/2022    9:26 AM  Depression screen PHQ 2/9  Decreased Interest 1 2 0  Down, Depressed, Hopeless 1 2 0  PHQ - 2 Score 2 4 0  Altered sleeping 0 1 0  Tired, decreased energy 1 2 2   Change in appetite 1 3 0  Feeling bad or failure about yourself  0 0 0  Trouble concentrating 1 3 0  Moving slowly or fidgety/restless 0 0 0  Suicidal thoughts 0 0 0  PHQ-9 Score 5 13 2   Difficult doing work/chores  Very difficult         07/13/2023    8:39 AM  Fall Risk   Falls in the past year? 0  Number falls in past yr: 0  Injury with Fall? 1   Risk for fall due to : No Fall Risks  Follow up Falls evaluation completed    Patient Care Team: Renne Crigler, FNP as PCP - General (Family Medicine) Lorriane Shire, MD as PCP - OBGYN (Obstetrics and Gynecology) Thomasene Ripple, DO as PCP - Cardiology (Cardiology) Judi Saa, DO as Consulting Physician (Family Medicine) Renne Crigler, FNP (Family Medicine)   Review of Systems  Constitutional:  Positive for chills, fatigue and fever. Negative for diaphoresis.  HENT:  Positive for postnasal drip, sinus pressure, sinus pain and sore throat. Negative for congestion and ear pain.   Respiratory:  Positive for cough. Negative for shortness of breath and wheezing.   Cardiovascular:  Negative for chest pain.  Gastrointestinal:  Positive for abdominal pain, diarrhea and nausea. Negative for constipation and vomiting.  Genitourinary:  Negative for dysuria, frequency and urgency.  Musculoskeletal:  Positive for back pain. Negative for arthralgias.  Skin: Negative.   Neurological:  Negative for weakness and headaches.  Psychiatric/Behavioral:  Negative for dysphoric mood. The patient is not nervous/anxious.     Current Outpatient Medications on File Prior to Visit  Medication Sig Dispense Refill   VENTOLIN HFA 108 (90 Base) MCG/ACT inhaler Inhale 2 puffs every 6 (  six) hours as needed for wheezing. 18 g 0   magnesium gluconate (MAGONATE) 500 MG tablet Take 500 mg by mouth 2 (two) times daily.     No current facility-administered medications on file prior to visit.   Past Medical History:  Diagnosis Date   Anxiety    Fatigue due to depression 04/12/2023   Left ovarian cyst    Past Surgical History:  Procedure Laterality Date   TONSILLECTOMY AND ADENOIDECTOMY      Family History  Problem Relation Age of Onset   Healthy Mother    Asthma Father    Hypertension Father    Heart disease Paternal Grandmother    Cancer Paternal Grandmother    Diabetes Paternal Grandfather     Social History   Socioeconomic History   Marital status: Single    Spouse name: Not on file   Number of children: Not on file   Years of education: Not on file   Highest education level: 12th grade  Occupational History   Not on file  Tobacco Use   Smoking status: Never   Smokeless tobacco: Never  Vaping Use   Vaping status: Never Used  Substance and Sexual Activity   Alcohol use: Not Currently   Drug use: Not Currently   Sexual activity: Not Currently    Birth control/protection: None  Other Topics Concern   Not on file  Social History Narrative   ** Merged History Encounter **    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 boyfriend and two kids   Caffeine One soda a day   Social Drivers of Health   Financial Resource Strain: Low Risk  (06/22/2023)   Overall Financial Resource Strain (CARDIA)    Difficulty of Paying Living Expenses: Not hard at all  Food Insecurity: No Food Insecurity (06/22/2023)   Hunger Vital Sign    Worried About Running Out of Food in the Last Year: Never true    Ran Out of Food in the Last Year: Never true  Transportation Needs: No Transportation Needs (06/22/2023)   PRAPARE - Administrator, Civil Service (Medical): No    Lack of Transportation (Non-Medical): No  Physical Activity: Inactive (06/22/2023)   Exercise Vital Sign    Days of Exercise per Week: 0 days    Minutes of Exercise per Session: 0 min  Stress: Stress Concern Present (06/22/2023)   [employer] of Occupational Health - Occupational Stress Questionnaire    Feeling of Stress : Rather much  Social Connections: Moderately Isolated (06/22/2023)   Social Connection and Isolation Panel [NHANES]    Frequency of Communication with Friends and Family: More than three times a week    Frequency of Social Gatherings with Friends and Family: Three times a week    Attends Religious Services: Never    Active Member of Clubs or Organizations: No    Attends Tax inspector Meetings: Never    Marital Status: Living with partner    Objective:  BP 100/60   Pulse (!) 113   Temp 99.8 F (37.7 C) (Oral)   Resp 16   Ht 5\' 2"  (1.575 m)   Wt 133 lb 6.4 oz (60.5 kg)   LMP 08/02/2023 (Exact Date)   SpO2 98%   BMI 24.40 kg/m      08/16/2023    8:14 AM 08/09/2023    9:59 AM 07/27/2023    4:10 PM  BP/Weight  Systolic BP 100 114 112  Diastolic BP  60 69 75  Wt. (Lbs) 133.4 133   BMI 24.4 kg/m2 24.33 kg/m2     Physical Exam Vitals reviewed.  Constitutional:      General: She is not in acute distress.    Appearance: Normal appearance. She is not ill-appearing.  HENT:     Right Ear: Tympanic membrane normal.     Left Ear: Tympanic membrane normal.     Nose:     Right Turbinates: Not swollen.     Left Turbinates: Not swollen.     Right Sinus: Frontal sinus tenderness present.     Left Sinus: Frontal sinus tenderness present.     Mouth/Throat:     Pharynx: Uvula midline. Posterior oropharyngeal erythema present.     Tonsils: No tonsillar abscesses.  Eyes:     Conjunctiva/sclera: Conjunctivae normal.  Neck:     Vascular: No carotid bruit.  Cardiovascular:     Rate and Rhythm: Normal rate and regular rhythm.     Heart sounds: Normal heart sounds. No murmur heard. Pulmonary:     Effort: Pulmonary effort is normal.     Breath sounds: Normal breath sounds. No wheezing.  Abdominal:     General: Bowel sounds are normal.     Palpations: Abdomen is soft.     Tenderness: There is no abdominal tenderness.  Skin:    General: Skin is warm.  Neurological:     Mental Status: She is alert. Mental status is at baseline.  Psychiatric:        Mood and Affect: Mood normal.        Behavior: Behavior normal.      Lab Results  Component Value Date   WBC 7.9 06/18/2023   HGB 14.0 06/18/2023   HCT 42.0 06/18/2023   PLT 314 06/18/2023   GLUCOSE 101 (H) 06/18/2023   ALT 11 04/16/2023   AST 12 04/16/2023   NA 139 06/18/2023   K 3.6 06/18/2023    CL 105 06/18/2023   CREATININE 0.69 06/18/2023   BUN 7 06/18/2023   CO2 23 06/18/2023   TSH 1.34 04/16/2023   HGBA1C 5.1 06/22/2023      Assessment & Plan:    Acute non-recurrent frontal sinusitis Assessment & Plan: Negative Flu and Covid. Negative strep test. Suspected bacterial sinusitis due to symptoms of frontal pain, fever, congestion and drainage. - Prescribe amoxicillin for potential bacterial sinusitis. - Advise increased fluid intake. - Recommend rest. - Continue antihistamine for nasal drainage. - Recommend Mucinex for congestion if needed. - Recommend Delsym for cough  Orders: -     Amoxicillin; Take 1 tablet (875 mg total) by mouth 2 (two) times daily for 10 days.  Dispense: 20 tablet; Refill: 0  Pharyngitis, unspecified etiology Assessment & Plan: Strep test - negative - Try honey and lemon for sore throat - Gargle with warm salt water - Try hot tea to soothe the throat   Orders: -     POCT rapid strep A  Flu-like symptoms Assessment & Plan:  Advised on infection prevention and recovery practices. - Advise discarding toothbrush after antibiotics. - Emphasize good hand hygiene. - Provide work excuse note for absence on previous day and today. - Advise return to work on August 20, 2023. - Instruct to contact via MyChart if symptoms worsen or concerns arise.  Orders: -     POC COVID-19 BinaxNow -     POCT Influenza A/B     Meds ordered this encounter  Medications   amoxicillin (AMOXIL) 875  MG tablet    Sig: Take 1 tablet (875 mg total) by mouth 2 (two) times daily for 10 days.    Dispense:  20 tablet    Refill:  0    Orders Placed This Encounter  Procedures   Rapid Strep A   POC COVID-19   POCT Influenza A/B     Follow-up: Return if symptoms worsen or fail to improve.  An After Visit Summary was printed and given to the patient.  Total time spent on today's visit was 30 minutes, including both face-to-face time and nonface-to-face time  personally spent on review of chart (labs and imaging), discussing labs and goals, discussing further work-up, treatment options, referrals to specialist if needed, reviewing outside records if pertinent, answering patient's questions, and coordinating care.    Lajuana Matte, FNP [facility] [phone]

## 2023-08-17 ENCOUNTER — Other Ambulatory Visit: Payer: Self-pay

## 2023-08-17 ENCOUNTER — Ambulatory Visit: Attending: Cardiology

## 2023-08-17 DIAGNOSIS — R42 Dizziness and giddiness: Secondary | ICD-10-CM

## 2023-08-17 DIAGNOSIS — R55 Syncope and collapse: Secondary | ICD-10-CM

## 2023-08-17 DIAGNOSIS — F411 Generalized anxiety disorder: Secondary | ICD-10-CM | POA: Diagnosis not present

## 2023-08-17 NOTE — Progress Notes (Signed)
 Zio order placed per Dr. Mallory Shirk request.

## 2023-08-17 NOTE — Progress Notes (Unsigned)
 Enrolled for [employer] to mail a ZIO XT long term holter monitor to the patients address on file.

## 2023-08-20 ENCOUNTER — Encounter: Payer: Self-pay | Admitting: Family Medicine

## 2023-08-22 DIAGNOSIS — J069 Acute upper respiratory infection, unspecified: Secondary | ICD-10-CM | POA: Diagnosis not present

## 2023-08-23 ENCOUNTER — Encounter: Payer: Self-pay | Admitting: Family Medicine

## 2023-08-23 ENCOUNTER — Ambulatory Visit: Admitting: Family Medicine

## 2023-08-23 VITALS — BP 108/70 | HR 100 | Temp 98.6°F | Resp 16 | Ht 62.0 in | Wt 132.4 lb

## 2023-08-23 DIAGNOSIS — R21 Rash and other nonspecific skin eruption: Secondary | ICD-10-CM | POA: Diagnosis not present

## 2023-08-23 NOTE — Assessment & Plan Note (Signed)
 Acute - started yesterday. - Possible rash due to EBV - Stop Amoxicillin  - Continue to take Zyrtec and Cortizone cream as needed for itching  - Order Mono panel - If symptoms persist, recommend steroid injection or prednisone taper.

## 2023-08-23 NOTE — Progress Notes (Addendum)
 Subjective:  Patient ID: Tricia Allen, female    DOB: May 14, 1996  Age: 28 y.o. MRN: [id number]  Chief Complaint  Patient presents with   Rash     Discussed the use of AI scribe software for clinical note transcription with the patient, who gave verbal consent to proceed.  HPI Tricia Allen, 28 year old female patient contacted me yesterday in reference to waking up with a rash all over her body. These consisted of some flat and some raised bumps, some are itchy and some are tender to touch.  I advised her to take an antihistamine like Benadryl or Zyrtec and cortisone cream for the itching and to stop the antibiotic that I prescribed to her for her sinus infection on 08/16/23.  She states that she has not changed any of her laundry detergents, body wash, or lotions. She has not been in contact with any insects or outdoor allergens.  She also reports that she has not eaten anything recently that could have caused an allergic reaction.  She states there is no swelling of the face or lips and no trouble swallowing.  I advised patient that this could be a rash as a result of having mono instead of a sinus infection as I prescribed amoxicillin for this. I explained to the patient that the Amoxicillin are not recommended for people with mononucleosis (caused by the Epstein-Barr virus or cytomegalovirus). Some people with mononucleosis who take these antibiotics may develop a rash, but it doesn't necessarily mean they are allergic to the antibiotic. The patient stated that she went to the Urgent Care yesterday at lunch and her mono spot test was negative. I advised her that it has been 12 days since symptom onset and they we need to perform a blood test to be certain.   Patient presents today with a red rash all over her body. Patient states in some places it hurts to touch. Patient denies any itching today. She also reports a low grade fever of 99.5 and heaviness in her lower extremities from her  knees down. She also reports two separate episodes of shortness of breath two days ago that lasted about 5 minutes each. Today she is wearing a heart monitor ordered by her OB/GYN doctor.      07/13/2023    8:40 AM 04/12/2023    3:48 PM 03/27/2022    9:26 AM  Depression screen PHQ 2/9  Decreased Interest 1 2 0  Down, Depressed, Hopeless 1 2 0  PHQ - 2 Score 2 4 0  Altered sleeping 0 1 0  Tired, decreased energy 1 2 2   Change in appetite 1 3 0  Feeling bad or failure about yourself  0 0 0  Trouble concentrating 1 3 0  Moving slowly or fidgety/restless 0 0 0  Suicidal thoughts 0 0 0  PHQ-9 Score 5 13 2   Difficult doing work/chores  Very difficult         07/13/2023    8:39 AM  Fall Risk   Falls in the past year? 0  Number falls in past yr: 0  Injury with Fall? 1  Risk for fall due to : No Fall Risks  Follow up Falls evaluation completed    Patient Care Team: Janece Means, FNP as PCP - General (Family Medicine) Kiki Pelton, MD as PCP - OBGYN (Obstetrics and Gynecology) Tobb, Kardie, DO as PCP - Cardiology (Cardiology) Isidro Margo, DO as Consulting Physician (Family Medicine) Janece Means, FNP (  Family Medicine)   Review of Systems  Constitutional:  Positive for fever (99.5). Negative for chills, diaphoresis and fatigue.  HENT:  Negative for congestion, ear pain and sinus pain.   Respiratory:  Positive for shortness of breath (2 days ago - 5 min each). Negative for cough.   Cardiovascular:  Negative for chest pain.  Gastrointestinal:  Negative for abdominal pain, constipation, nausea and vomiting.  Genitourinary:  Negative for dysuria.  Musculoskeletal:  Negative for arthralgias.       Leg heaviness  Skin:  Positive for rash.  Neurological:  Positive for light-headedness. Negative for weakness and headaches.  Psychiatric/Behavioral:  Negative for dysphoric mood. The patient is not nervous/anxious.     Current Outpatient Medications on File Prior to Visit   Medication Sig Dispense Refill   amoxicillin (AMOXIL) 875 MG tablet Take 1 tablet (875 mg total) by mouth 2 (two) times daily for 10 days. 20 tablet 0   magnesium gluconate (MAGONATE) 500 MG tablet Take 500 mg by mouth 2 (two) times daily.     VENTOLIN HFA 108 (90 Base) MCG/ACT inhaler Inhale 2 puffs every 6 (six) hours as needed for wheezing. 18 g 0   No current facility-administered medications on file prior to visit.   Past Medical History:  Diagnosis Date   Anxiety    Fatigue due to depression 04/12/2023   Left ovarian cyst    Past Surgical History:  Procedure Laterality Date   TONSILLECTOMY AND ADENOIDECTOMY      Family History  Problem Relation Age of Onset   Healthy Mother    Asthma Father    Hypertension Father    Heart disease Paternal Grandmother    Cancer Paternal Grandmother    Diabetes Paternal Grandfather    Social History   Socioeconomic History   Marital status: Single    Spouse name: Not on file   Number of children: Not on file   Years of education: Not on file   Highest education level: 12th grade  Occupational History   Not on file  Tobacco Use   Smoking status: Never   Smokeless tobacco: Never  Vaping Use   Vaping status: Never Used  Substance and Sexual Activity   Alcohol use: Not Currently   Drug use: Not Currently   Sexual activity: Not Currently    Birth control/protection: None  Other Topics Concern   Not on file  Social History Narrative   ** Merged History Encounter **    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 boyfriend and 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   Caffeine One soda a day   Social Drivers of Health   Financial Resource Strain: Low Risk  (06/22/2023)   Overall Financial Resource Strain (CARDIA)    Difficulty of Paying Living Expenses: Not hard at all  Food Insecurity: No Food Insecurity (06/22/2023)   Hunger Vital Sign    Worried About Running Out of Food in the Last Year: Never true    Ran Out of Food in the Last Year: Never true   Transportation Needs: No Transportation Needs (06/22/2023)   PRAPARE - Administrator, Civil Service (Medical): No    Lack of Transportation (Non-Medical): No  Physical Activity: Inactive (06/22/2023)   Exercise Vital Sign    Days of Exercise per Week: 0 days    Minutes of Exercise per Session: 0 min  Stress: Stress Concern Present (06/22/2023)   [employer] of Occupational Health - Occupational Stress Questionnaire  Feeling of Stress : Rather much  Social Connections: Moderately Isolated (06/22/2023)   Social Connection and Isolation Panel [NHANES]    Frequency of Communication with Friends and Family: More than three times a week    Frequency of Social Gatherings with Friends and Family: Three times a week    Attends Religious Services: Never    Active Member of Clubs or Organizations: No    Attends Engineer, structural: Never    Marital Status: Living with partner    Objective:  BP 108/70 (BP Location: Left Arm, Patient Position: Sitting, Cuff Size: Normal)   Pulse 100   Temp 98.6 F (37 C) (Temporal)   Resp 16   Ht 5\' 2"  (1.575 m)   Wt 132 lb 6.4 oz (60.1 kg)   LMP 08/02/2023 (Exact Date)   SpO2 98%   BMI 24.22 kg/m      08/23/2023    8:27 AM 08/23/2023    7:48 AM 08/16/2023    8:14 AM  BP/Weight  Systolic BP 108 100 100  Diastolic BP 70 60 60  Wt. (Lbs)  132.4 133.4  BMI  24.22 kg/m2 24.4 kg/m2    Physical Exam Vitals reviewed.  Constitutional:      General: She is not in acute distress.    Appearance: Normal appearance. She is not ill-appearing.  Eyes:     Conjunctiva/sclera: Conjunctivae normal.  Cardiovascular:     Rate and Rhythm: Normal rate and regular rhythm.     Heart sounds: Normal heart sounds. No murmur heard. Pulmonary:     Effort: Pulmonary effort is normal.     Breath sounds: Normal breath sounds. No wheezing.  Musculoskeletal:        General: Normal range of motion.  Skin:    General: Skin is warm.     Findings:  Rash present. Rash is macular and urticarial.  Neurological:     Mental Status: She is alert. Mental status is at baseline.  Psychiatric:        Mood and Affect: Mood normal.        Behavior: Behavior normal.        Lab Results  Component Value Date   WBC 7.9 06/18/2023   HGB 14.0 06/18/2023   HCT 42.0 06/18/2023   PLT 314 06/18/2023   GLUCOSE 101 (H) 06/18/2023   ALT 11 04/16/2023   AST 12 04/16/2023   NA 139 06/18/2023   K 3.6 06/18/2023   CL 105 06/18/2023   CREATININE 0.69 06/18/2023   BUN 7 06/18/2023   CO2 23 06/18/2023   TSH 1.34 04/16/2023   HGBA1C 5.1 06/22/2023    Assessment & Plan:   Rash Assessment & Plan: Acute - started yesterday. - Possible rash due to EBV - Stop Amoxicillin  - Continue to take Zyrtec and Cortizone cream as needed for itching  - Order Mono panel - If symptoms persist, recommend steroid injection or prednisone taper.  Orders: -     EPSTEIN-BARR VIRUS (EBV) Antibody Profile     No orders of the defined types were placed in this encounter.   Orders Placed This Encounter  Procedures   EPSTEIN-BARR VIRUS (EBV) Antibody Profile     Follow-up: Return if symptoms worsen or fail to improve.    An After Visit Summary was printed and given to the patient.  Total time spent on today's visit was 32 minutes, including both face-to-face time and nonface-to-face time personally spent on review of chart, discussing further work-up, treatment  options, referrals to specialist if needed, and answering patient's questions.   Delford Felling, FNP [facility] [phone]

## 2023-08-23 NOTE — Progress Notes (Deleted)
  Tricia Allen [address] [place] [address] Phone: [phone] Subjective:    I'm seeing this patient by the request  of:  Tricia Crigler, FNP  CC:   [id number]  Tricia Allen is a 28 y.o. female coming in with complaint of back and neck pain. OMT on 07/13/2023. Patient states   Medications patient has been prescribed:   Taking:         Reviewed prior external information including notes and imaging from previsou exam, outside providers and external EMR if available.   As well as notes that were available from care everywhere and other healthcare systems.  Past medical history, social, surgical and family history all reviewed in electronic medical record.  No pertanent information unless stated regarding to the chief complaint.   Past Medical History:  Diagnosis Date   Anxiety    Fatigue due to depression 04/12/2023   Left ovarian cyst     Allergies  Allergen Reactions   Codeine Other (See Comments)    Hallucinations   Codeine Other (See Comments)    Hallucinations     Review of Systems:  No headache, visual changes, nausea, vomiting, diarrhea, constipation, dizziness, abdominal pain, skin rash, fevers, chills, night sweats, weight loss, swollen lymph nodes, body aches, joint swelling, chest pain, shortness of breath, mood changes. POSITIVE muscle aches  Objective  Last menstrual period 08/02/2023, not currently breastfeeding.   General: No apparent distress alert and oriented x3 mood and affect normal, dressed appropriately.  HEENT: Pupils equal, extraocular movements intact  Respiratory: Patient's speak in full sentences and does not appear short of breath  Cardiovascular: No lower extremity edema, non tender, no erythema  Gait MSK:  Back   Osteopathic findings  C2 flexed rotated and side bent right C6 flexed rotated and side bent left T3 extended rotated and side bent right inhaled rib T9 extended rotated  and side bent left L2 flexed rotated and side bent right Sacrum right on right       Assessment and Plan:  No problem-specific Assessment & Plan notes found for this encounter.    Nonallopathic problems  Decision today to treat with OMT was based on Physical Exam  After verbal consent patient was treated with HVLA, ME, FPR techniques in cervical, rib, thoracic, lumbar, and sacral  areas  Patient tolerated the procedure well with improvement in symptoms  Patient given exercises, stretches and lifestyle modifications  See medications in patient instructions if given  Patient will follow up in 4-8 weeks             Note: This dictation was prepared with Dragon dictation along with smaller phrase technology. Any transcriptional errors that result from this process are unintentional.

## 2023-08-24 ENCOUNTER — Encounter (HOSPITAL_BASED_OUTPATIENT_CLINIC_OR_DEPARTMENT_OTHER): Payer: Self-pay | Admitting: Emergency Medicine

## 2023-08-24 ENCOUNTER — Other Ambulatory Visit: Payer: Self-pay

## 2023-08-24 ENCOUNTER — Emergency Department (HOSPITAL_BASED_OUTPATIENT_CLINIC_OR_DEPARTMENT_OTHER)
Admission: EM | Admit: 2023-08-24 | Discharge: 2023-08-24 | Disposition: A | Discharge status: Home / Self Care | Attending: Emergency Medicine | Admitting: Emergency Medicine

## 2023-08-24 ENCOUNTER — Ambulatory Visit: Payer: Medicaid Other | Admitting: Family Medicine

## 2023-08-24 DIAGNOSIS — R21 Rash and other nonspecific skin eruption: Secondary | ICD-10-CM | POA: Insufficient documentation

## 2023-08-24 DIAGNOSIS — R531 Weakness: Secondary | ICD-10-CM | POA: Diagnosis not present

## 2023-08-24 LAB — COMPREHENSIVE METABOLIC PANEL
ALT: 13 U/L (ref 0–44)
AST: 14 U/L — ABNORMAL LOW (ref 15–41)
Albumin: 4.2 g/dL (ref 3.5–5.0)
Alkaline Phosphatase: 38 U/L (ref 38–126)
Anion gap: 8 (ref 5–15)
BUN: 10 mg/dL (ref 6–20)
CO2: 24 mmol/L (ref 22–32)
Calcium: 9 mg/dL (ref 8.9–10.3)
Chloride: 104 mmol/L (ref 98–111)
Creatinine, Ser: 0.63 mg/dL (ref 0.44–1.00)
GFR, Estimated: >60 mL/min (ref >60)
Glucose, Bld: 89 mg/dL (ref 70–99)
Potassium: 3.9 mmol/L (ref 3.5–5.1)
Sodium: 136 mmol/L (ref 135–145)
Total Bilirubin: 0.6 mg/dL (ref 0.0–1.2)
Total Protein: 7.8 g/dL (ref 6.5–8.1)

## 2023-08-24 LAB — CBC WITH DIFFERENTIAL/PLATELET
Abs Immature Granulocytes: 0.09 10*3/uL — ABNORMAL HIGH (ref 0.00–0.07)
Basophils Absolute: 0 10*3/uL (ref 0.0–0.1)
Basophils Relative: 0 %
Eosinophils Absolute: 0 10*3/uL (ref 0.0–0.5)
Eosinophils Relative: 0 %
HCT: 34 % — ABNORMAL LOW (ref 36.0–46.0)
Hemoglobin: 11.8 g/dL — ABNORMAL LOW (ref 12.0–15.0)
Immature Granulocytes: 1 %
Lymphocytes Relative: 21 %
Lymphs Abs: 1.4 10*3/uL (ref 0.7–4.0)
MCH: 31.5 pg (ref 26.0–34.0)
MCHC: 34.7 g/dL (ref 30.0–36.0)
MCV: 90.7 fL (ref 80.0–100.0)
Monocytes Absolute: 0.4 10*3/uL (ref 0.1–1.0)
Monocytes Relative: 6 %
Neutro Abs: 4.6 10*3/uL (ref 1.7–7.7)
Neutrophils Relative %: 72 %
Platelets: 339 10*3/uL (ref 150–400)
RBC: 3.75 MIL/uL — ABNORMAL LOW (ref 3.87–5.11)
RDW: 12 % (ref 11.5–15.5)
WBC: 6.4 10*3/uL (ref 4.0–10.5)
nRBC: 0 % (ref 0.0–0.2)

## 2023-08-24 LAB — CK: Total CK: 52 U/L (ref 38–234)

## 2023-08-24 LAB — URINALYSIS, ROUTINE W REFLEX MICROSCOPIC
Bilirubin Urine: NEGATIVE
Glucose, UA: NEGATIVE mg/dL
Hgb urine dipstick: NEGATIVE
Ketones, ur: NEGATIVE mg/dL
Nitrite: NEGATIVE
Protein, ur: NEGATIVE mg/dL
Specific Gravity, Urine: 1.01 (ref 1.005–1.030)
pH: 6 (ref 5.0–8.0)

## 2023-08-24 LAB — EPSTEIN-BARR VIRUS (EBV) ANTIBODY PROFILE
EBV NA IgG: >600 U/mL — ABNORMAL HIGH (ref 0.0–17.9)
EBV VCA IgG: 48.7 U/mL — ABNORMAL HIGH (ref 0.0–17.9)
EBV VCA IgM: >160 U/mL — ABNORMAL HIGH (ref 0.0–35.9)

## 2023-08-24 LAB — PREGNANCY, URINE: Preg Test, Ur: NEGATIVE

## 2023-08-24 LAB — URINALYSIS, MICROSCOPIC (REFLEX)

## 2023-08-24 LAB — MAGNESIUM: Magnesium: 2.1 mg/dL (ref 1.7–2.4)

## 2023-08-24 LAB — LIPASE, BLOOD: Lipase: 30 U/L (ref 11–51)

## 2023-08-24 NOTE — ED Provider Notes (Signed)
 Emergency Department Provider Note   I have reviewed the triage vital signs and the nursing notes.   HISTORY  Chief Complaint Rash   HPI Tricia Allen is a 28 y.o. female with past history reviewed below presents emergency department for evaluation of rash to the extremities, muscle aches, intermittent weakness in the legs.  Symptoms have been progressing over the past week.  She was started on amoxicillin with presumed sinus issues.  This was discontinued after the rash began with thinking that it could be reaction to an underlying viral type process.  Lab work to this effect was sent and results are pending.  The rash to the legs has almost completely resolved with patient taking Zyrtec.  She continues to have a faint, residual rash to the upper extremities.  No itching or pain.  She occasionally has muscle soreness and tightness in and around her neck muscles in her legs but notices that she often feels weakness in the bilateral lower extremities seeming to extend from the knees downward.  Symptoms seem worse later in the day.  Past Medical History:  Diagnosis Date   Anxiety    Fatigue due to depression 04/12/2023   Left ovarian cyst     Review of Systems  Constitutional: No fever/chills Cardiovascular: Denies chest pain. Respiratory: Denies shortness of breath. Gastrointestinal: No abdominal pain.  No nausea, no vomiting.  No diarrhea.  No constipation. Genitourinary: Negative for dysuria. Musculoskeletal: Negative for back pain. Skin: Negative for rash. Neurological: Negative for headaches  ____________________________________________   PHYSICAL EXAM:  VITAL SIGNS: ED Triage Vitals  Encounter Vitals Group     BP 08/24/23 1026 111/82     Pulse Rate 08/24/23 1026 (!) 101     Resp 08/24/23 1026 16     Temp 08/24/23 1026 98.1 F (36.7 C)     Temp src --      SpO2 08/24/23 1026 98 %     Weight 08/24/23 1022 132 lb 7.9 oz (60.1 kg)     Height 08/24/23 1022 5'  2" (1.575 m)   Constitutional: Alert and oriented. Well appearing and in no acute distress. Eyes: Conjunctivae are normal. Head: Atraumatic. Nose: No congestion/rhinnorhea. Mouth/Throat: Mucous membranes are moist.  Neck: No stridor.   Cardiovascular: Normal rate, regular rhythm. Good peripheral circulation. Grossly normal heart sounds.   Respiratory: Normal respiratory effort.  No retractions. Lungs CTAB. Gastrointestinal: No distention.  Musculoskeletal: No gross deformities of extremities. Neurologic:  Normal speech and language.  Skin:  Skin is warm, dry and intact. Finer, lacy erythematous rash to the upper extremities. No petechiae.    ____________________________________________   LABS (all labs ordered are listed, but only abnormal results are displayed)  Labs Reviewed  URINALYSIS, ROUTINE W REFLEX MICROSCOPIC - Abnormal; Notable for the following components:      Result Value   Leukocytes,Ua SMALL (*)    All other components within normal limits  URINALYSIS, MICROSCOPIC (REFLEX) - Abnormal; Notable for the following components:   Bacteria, UA FEW (*)    All other components within normal limits  COMPREHENSIVE METABOLIC PANEL - Abnormal; Notable for the following components:   AST 14 (*)    All other components within normal limits  CBC WITH DIFFERENTIAL/PLATELET - Abnormal; Notable for the following components:   RBC 3.75 (*)    Hemoglobin 11.8 (*)    HCT 34.0 (*)    Abs Immature Granulocytes 0.09 (*)    All other components within normal limits  PREGNANCY,  URINE  LIPASE, BLOOD  MAGNESIUM  CK    ____________________________________________   PROCEDURES  Procedure(s) performed:   Procedures  None  ____________________________________________   INITIAL IMPRESSION / ASSESSMENT AND PLAN / ED COURSE  Pertinent labs & imaging results that were available during my care of the patient were reviewed by me and considered in my medical decision making (see  chart for details).   This patient is Presenting for Evaluation of rash/weakness, which does require a range of treatment options, and is a complaint that involves a moderate risk of morbidity and mortality.  The Differential Diagnoses include Viral process, drug rash, DRES, SJS/TEN, etc.  I decided to review pertinent External Data, and in summary EBV positive from PCP this week.   Clinical Laboratory Tests Ordered, included CK normal.  Magnesium and potassium normal.  No acute kidney injury.  UA without infection.  Pregnancy negative.  Medical Decision Making: Summary:  Patient presents to the emergency department with rash which is resolving.  No findings on exam to suspect SJS or other severe/emergent rash.  Seems most consistent with EBV.  She has discontinued antibiotics over the past week.  She is describing some new weakness and muscle aches which may be consistent with viral process.  Plan for screening blood work and reassess.  Reevaluation with update and discussion with labs are overall reassuring.  No evidence of rhabdomyolysis.  No acute kidney injury or electrolyte disturbance.  Plan for continued supportive care at home with close PCP follow-up.  Patient's presentation is most consistent with acute, uncomplicated illness.   Disposition: discharge  ____________________________________________  FINAL CLINICAL IMPRESSION(S) / ED DIAGNOSES  Final diagnoses:  Rash  Weakness    Note:  This document was prepared using Dragon voice recognition software and may include unintentional dictation errors.  Alona Bene, MD, [facility]    Raja Liska, Arlyss Repress, MD 08/24/23 [phone]

## 2023-08-24 NOTE — Discharge Instructions (Signed)
 Please drink plenty of fluids and continue your Zyrtec.  You may use over-the-counter hydrocortisone cream on the rash if it becomes itchy or worsens.  You should return to the emergency department with any fever, sores developing in your mouth, sudden worsening rash all over.  Please follow-up with your family doctor in the next week to make sure your symptoms are resolving.

## 2023-08-24 NOTE — ED Triage Notes (Addendum)
 Rash on arms and legs no itching states hurts to touch  and her neck hurts all started wed am  saw the dr , was tested for mono no reults yet ,has been nauseated and tired

## 2023-08-31 DIAGNOSIS — F411 Generalized anxiety disorder: Secondary | ICD-10-CM | POA: Diagnosis not present

## 2023-09-04 ENCOUNTER — Encounter: Payer: Self-pay | Admitting: Family Medicine

## 2023-09-07 DIAGNOSIS — M6281 Muscle weakness (generalized): Secondary | ICD-10-CM | POA: Diagnosis not present

## 2023-09-07 DIAGNOSIS — M545 Low back pain, unspecified: Secondary | ICD-10-CM | POA: Diagnosis not present

## 2023-09-07 DIAGNOSIS — R293 Abnormal posture: Secondary | ICD-10-CM | POA: Diagnosis not present

## 2023-09-07 DIAGNOSIS — M2560 Stiffness of unspecified joint, not elsewhere classified: Secondary | ICD-10-CM | POA: Diagnosis not present

## 2023-09-07 DIAGNOSIS — M4143 Neuromuscular scoliosis, cervicothoracic region: Secondary | ICD-10-CM | POA: Diagnosis not present

## 2023-09-10 ENCOUNTER — Telehealth: Payer: Self-pay | Admitting: Family Medicine

## 2023-09-10 NOTE — Telephone Encounter (Signed)
 [facility] SERVICES - START OF CARE 07/23/2023

## 2023-09-12 ENCOUNTER — Telehealth (INDEPENDENT_AMBULATORY_CARE_PROVIDER_SITE_OTHER): Payer: Medicaid Other | Admitting: Neurology

## 2023-09-12 ENCOUNTER — Encounter: Payer: Self-pay | Admitting: Neurology

## 2023-09-12 VITALS — Ht 62.0 in | Wt 133.0 lb

## 2023-09-12 DIAGNOSIS — R519 Headache, unspecified: Secondary | ICD-10-CM | POA: Diagnosis not present

## 2023-09-12 DIAGNOSIS — R531 Weakness: Secondary | ICD-10-CM

## 2023-09-12 DIAGNOSIS — M542 Cervicalgia: Secondary | ICD-10-CM

## 2023-09-12 NOTE — Progress Notes (Signed)
 Virtual Visit via Video Note The purpose of this virtual visit is to provide medical care while limiting exposure to the novel coronavirus.    Consent was obtained for video visit:  Yes.   Answered questions that patient had about telehealth interaction:  Yes.     Pt location: Home Physician Location: office Name of referring provider:  Renne Crigler, FNP I connected with Tricia Allen at patients initiation/request on 09/12/2023 at  2:30 PM EDT by video enabled telemedicine application and verified that I am speaking with the correct person using two identifiers. Pt MRN:  [id number] Pt DOB:  07/01/1995 Video Participants:  Tricia Allen   History of Present Illness:  The patient had a virtual video visit on 09/12/2023. She was last seen in the neurology clinic 3 months ago for dizziness and right-sided weakness. She reported waking up with right leg heaviness and significant neck pain. Her neurological exam was normal. MRI brain no acute changes, there is note of low lying tonsils which would not typically cause these symptoms. Records and images available were reviewed. She had an EMG/NCV of the right arm and leg in 07/2023 which was normal. MRI cervical spine without contrast done 07/2023 showed small central to right paracentral disc protrusion at C6-7 without significant stenosis, The ventral right C7 nerve root could potentially be affected; mild noncompressive disc bulging at C3-4 throughout C5-6 without significant stenosis or neural impingement.   She has been doing PT and reports the right side is better, it still happens but not as much. She is not having a lot of neck pain, but now she has been having pain in her tailbone region and PT is working on this. No bowel/bladder dysfunction, no falls. She is still getting dizzy and had questions about her iron levels. She completed a heart monitor which did captured the dizzy episodes, results unavailable for review. She reports  daily headaches, no nausea/vomiting. She does not like taking medication and only takes Tylenol as needed (not daily). No loss of consciousness. She is sleeping 7-8 hours at night.     History on Initial Assessment 07/02/2023: This is a 28 year old right-handed woman with a history of anxiety, depression, presenting for dizziness and right-sided weakness. She states she started having dizziness a couple of months after the delivery of her baby in May 2024. She was dizzy almost daily, feeling lightheaded like she was going to pass out. Vision gets blurred, walking makes it worse. She has noticed bright lights and a lot of stimuli can also trigger it. She feels off balance, no spinning sensation, no nausea/vomiting. She started seeing Cardiology in January 2024 for intermittent palpitations leading to lightheadedness and dizziness. Zio patch and echocardiogram in 07/2022 were normal. The other week she was having it all day and had to lay down, but sometimes dizziness does not resolve even when supine. She has headaches but it is not always associated with the dizziness. She notes headaches started a few months ago with pressure in the frontal regions and a heaviness in the back of her head. Pain in the back of her head occurs when there is neck pain. No nausea/vomiting, there is occasional sensitivity to lights and sounds. Tylenol or Ibuprofen may help, she also takes Excedrin. She has been using a "headache cap" which also helps. Pain occurs a couple of times a week, they do not wake her from sleep. On her PCP appointment on 06/21/22, she reported new onset numbness and  tingling of the right arm and leg. She woke up in the middle of the night on 1/10 with her right leg feeling very heavy. Right arm was also affected. It lasted for a couple of days. She was also having bad neck pain that time. No bowel/bladder dysfunction. She denies any head injuries, falls, or heavy lifting. Last weekend, she had neck pain and  right leg heaviness on and off throughout the day. When symptoms occur, the back of her head through the neck really hurts. No facial symptoms but sometimes she feels like she cannot swallow. Vision is blurred, she has floaters. Recent eye exam was normal. She gets an average of 8 hours of sleep.   Diagnostic Data: I personally reviewed MRI brain with and without contrast done 05/2023 which did not show any acute changes. 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  EMG/NCV of the right arm and leg in 07/2023 normal.   MRI cervical spine without contrast done 07/2023 showed small central to right paracentral disc protrusion at C6-7 without significant stenosis, The ventral right C7 nerve root could potentially be affected; mild noncompressive disc bulging at C3-4 throughout C5-6 without significant stenosis or neural impingement.    Lab Results  Component Value Date   HGBA1C 5.1 06/22/2023      Current Outpatient Medications on File Prior to Visit  Medication Sig Dispense Refill   magnesium gluconate (MAGONATE) 500 MG tablet Take 500 mg by mouth 2 (two) times daily.     VENTOLIN HFA 108 (90 Base) MCG/ACT inhaler Inhale 2 puffs every 6 (six) hours as needed for wheezing. 18 g 0   No current facility-administered medications on file prior to visit.     Observations/Objective:   Vitals:   09/12/23 1433  Weight: 133 lb (60.3 kg)  Height: 5\' 2"  (1.575 m)   GEN:  The patient appears stated age and is in NAD.  Neurological examination: Patient is awake, alert. No aphasia or dysarthria. Intact fluency and comprehension. Cranial nerves: Extraocular movements intact. No facial asymmetry. Motor: moves all extremities symmetrically, at least anti-gravity x 4.    Assessment and Plan:   This is a 28 yo RH woman with a history of anxiety, depression, who presented for dizziness and right-sided weakness. MRI brain  no acute changes, there is note of low lying tonsils which would not typically cause these symptoms. Nerve conduction test normal. Cervical MRI showed disc protrusion at C6-7, ventral right C7 nerve root could potentially be affected. There was also mild disc bulges throughout the cervical region. She reports neck pain is better, right-sided weakness still occurs but less than before, continue with PT. I suspect this is musculoskeletal rather than central. We discussed dizziness is also unlikely to be neurological, follow-up on cardiac monitor and anemia. She reports daily headaches, we discussed starting a daily preventative medication, she is not interested at this time. She knows to minimize headache rescue medications to 2-3 a week to avoid rebound headaches. Follow-up in 6 months, call for any changes.    Follow Up Instructions:    -I discussed the assessment and treatment plan with the patient. The patient was provided an opportunity to ask questions and all were answered. The patient agreed with the plan and demonstrated an understanding of the instructions.   The patient was advised to call back or seek an in-person evaluation if the symptoms worsen or if the condition fails  to improve as anticipated.    Van Clines, MD

## 2023-09-12 NOTE — Patient Instructions (Signed)
 Good to see you doing better.  Follow-up on cardiac monitor results, continue working with PCP on anemia.  Keep a calendar of headaches, we can discuss daily preventative medication if you wish  Continue with physical therapy  Follow-up in 6 months, call for any changes.

## 2023-09-13 ENCOUNTER — Encounter: Payer: Self-pay | Admitting: Cardiology

## 2023-09-14 ENCOUNTER — Ambulatory Visit: Admitting: Family Medicine

## 2023-09-14 ENCOUNTER — Encounter: Payer: Self-pay | Admitting: Family Medicine

## 2023-09-14 VITALS — BP 100/72 | HR 94 | Temp 98.3°F | Resp 16 | Ht 62.0 in | Wt 135.8 lb

## 2023-09-14 DIAGNOSIS — R233 Spontaneous ecchymoses: Secondary | ICD-10-CM | POA: Diagnosis not present

## 2023-09-14 DIAGNOSIS — M4143 Neuromuscular scoliosis, cervicothoracic region: Secondary | ICD-10-CM | POA: Diagnosis not present

## 2023-09-14 DIAGNOSIS — D508 Other iron deficiency anemias: Secondary | ICD-10-CM | POA: Diagnosis not present

## 2023-09-14 DIAGNOSIS — R293 Abnormal posture: Secondary | ICD-10-CM | POA: Diagnosis not present

## 2023-09-14 DIAGNOSIS — M545 Low back pain, unspecified: Secondary | ICD-10-CM | POA: Diagnosis not present

## 2023-09-14 DIAGNOSIS — M6281 Muscle weakness (generalized): Secondary | ICD-10-CM | POA: Diagnosis not present

## 2023-09-14 DIAGNOSIS — M2560 Stiffness of unspecified joint, not elsewhere classified: Secondary | ICD-10-CM | POA: Diagnosis not present

## 2023-09-14 NOTE — Assessment & Plan Note (Signed)
 Significant fluctuation in iron levels with low ferritin. Possible dietary influences on iron metabolism. - Review dietary intake for iron sources. - Include iron studies in blood work. - Consider hematology referral for iron metabolism evaluation if needed.

## 2023-09-14 NOTE — Assessment & Plan Note (Signed)
 Recurrent bruising without trauma or medication use. Differential includes blood disorders and nutritional deficiencies. Normal liver and kidney function. Recent mononucleosis. - Order PT, INR, PTT, and peripheral blood smear. - Recheck liver and kidney function tests. - Evaluate nutritional levels: B12, folate, zinc, vitamin K, and vitamin C. - Refer to hematology if initial tests are inconclusive.

## 2023-09-14 NOTE — Progress Notes (Signed)
 Subjective:  Patient ID: Tricia Allen, female    DOB: 06/03/1996  Age: 28 y.o. MRN: [id number]  Chief Complaint  Patient presents with   Bleeding/Bruising    Unexplained bruising   Discussed the use of AI scribe software for clinical note transcription with the patient, who gave verbal consent to proceed.  History of Present Illness   Tricia Allen is a 28 year old female who presents with unexplained bruising to bilateral lower extremities. Patient stated that it happens randomly. She also stated that when the bruising occurs she also has some dull aching pain with it.   She experiences unexplained bruising primarily on her left leg and the top of her back. These bruises appear in clusters and occur approximately every couple of weeks. No known injuries or trauma could account for the bruising. No excessive menstrual bleeding, postpartum hemorrhage, or any history of concussions with bleeding.  She has experienced fluctuations in her iron levels, noting a significant increase from very low to borderline high without recent iron supplementation. She previously required iron infusions when her levels were low.  She does not regularly take ibuprofen, only using it occasionally, and denies the use of steroids, blood thinners, or antidepressants. Her current supplement intake includes magnesium, and she does not take ashwagandha.  Her recent medical history includes a past infection with mononucleosis.         07/13/2023    8:40 AM 04/12/2023    3:48 PM 03/27/2022    9:26 AM  Depression screen PHQ 2/9  Decreased Interest 1 2 0  Down, Depressed, Hopeless 1 2 0  PHQ - 2 Score 2 4 0  Altered sleeping 0 1 0  Tired, decreased energy 1 2 2   Change in appetite 1 3 0  Feeling bad or failure about yourself  0 0 0  Trouble concentrating 1 3 0  Moving slowly or fidgety/restless 0 0 0  Suicidal thoughts 0 0 0  PHQ-9 Score 5 13 2   Difficult doing work/chores  Very difficult          09/12/2023    2:33 PM  Fall Risk   Falls in the past year? 0  Number falls in past yr: 0  Injury with Fall? 0  Follow up Falls evaluation completed    Patient Care Team: Renne Crigler, FNP as PCP - General (Family Medicine) Lorriane Shire, MD as PCP - OBGYN (Obstetrics and Gynecology) Thomasene Ripple, DO as PCP - Cardiology (Cardiology) Judi Saa, DO as Consulting Physician (Family Medicine) Renne Crigler, FNP (Family Medicine) Van Clines, MD as Consulting Physician (Neurology)   Review of Systems  Constitutional:  Negative for chills, diaphoresis, fatigue and fever.  HENT:  Negative for congestion, ear pain and sinus pain.   Eyes: Negative.   Respiratory:  Negative for cough and shortness of breath.   Cardiovascular:  Negative for chest pain.  Gastrointestinal:  Negative for abdominal pain, constipation, nausea and vomiting.  Endocrine: Negative.   Genitourinary:  Negative for dysuria, frequency and urgency.  Musculoskeletal:  Negative for arthralgias.  Allergic/Immunologic: Negative.   Neurological:  Negative for weakness and headaches.  Hematological:  Bruises/bleeds easily.  Psychiatric/Behavioral:  Negative for dysphoric mood. The patient is not nervous/anxious.     Current Outpatient Medications on File Prior to Visit  Medication Sig Dispense Refill   magnesium gluconate (MAGONATE) 500 MG tablet Take 500 mg by mouth 2 (two) times daily.     VENTOLIN HFA 108 (  90 Base) MCG/ACT inhaler Inhale 2 puffs every 6 (six) hours as needed for wheezing. 18 g 0   No current facility-administered medications on file prior to visit.   Past Medical History:  Diagnosis Date   Anxiety    Fatigue due to depression 04/12/2023   Left ovarian cyst    Past Surgical History:  Procedure Laterality Date   TONSILLECTOMY AND ADENOIDECTOMY      Family History  Problem Relation Age of Onset   Healthy Mother    Asthma Father    Hypertension Father    Heart disease  Paternal Grandmother    Cancer Paternal Grandmother    Diabetes Paternal Grandfather    Social History   Socioeconomic History   Marital status: Single    Spouse name: Not on file   Number of children: Not on file   Years of education: Not on file   Highest education level: 12th grade  Occupational History   Not on file  Tobacco Use   Smoking status: Never   Smokeless tobacco: Never  Vaping Use   Vaping status: Never Used  Substance and Sexual Activity   Alcohol use: Not Currently   Drug use: Never   Sexual activity: Not Currently    Birth control/protection: None  Other Topics Concern   Not on file  Social History Narrative   ** Merged History Encounter **    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 boyfriend and two kids   Caffeine One soda a day   Social Drivers of Health   Financial Resource Strain: Low Risk  (06/22/2023)   Overall Financial Resource Strain (CARDIA)    Difficulty of Paying Living Expenses: Not hard at all  Food Insecurity: No Food Insecurity (06/22/2023)   Hunger Vital Sign    Worried About Running Out of Food in the Last Year: Never true    Ran Out of Food in the Last Year: Never true  Transportation Needs: No Transportation Needs (06/22/2023)   PRAPARE - Administrator, Civil Service (Medical): No    Lack of Transportation (Non-Medical): No  Physical Activity: Inactive (06/22/2023)   Exercise Vital Sign    Days of Exercise per Week: 0 days    Minutes of Exercise per Session: 0 min  Stress: Stress Concern Present (06/22/2023)   [employer] of Occupational Health - Occupational Stress Questionnaire    Feeling of Stress : Rather much  Social Connections: Moderately Isolated (06/22/2023)   Social Connection and Isolation Panel [NHANES]    Frequency of Communication with Friends and Family: More than three times a week    Frequency of Social Gatherings with Friends and Family: Three times a week    Attends Religious Services:  Never    Active Member of Clubs or Organizations: No    Attends Banker Meetings: Never    Marital Status: Living with partner    Objective:  BP 100/72   Pulse 94   Temp 98.3 F (36.8 C) (Temporal)   Resp 16   Ht 5\' 2"  (1.575 m)   Wt 135 lb 12.8 oz (61.6 kg)   LMP 08/29/2023 (Exact Date)   SpO2 94%   BMI 24.84 kg/m      09/14/2023    8:44 AM 09/12/2023    2:33 PM 08/24/2023   10:26 AM  BP/Weight  Systolic BP 100  401  Diastolic BP 72  82  Wt. (Lbs) 135.8 133   BMI 24.84 kg/m2  24.33 kg/m2     Physical Exam Constitutional:      General: She is not in acute distress.    Appearance: Normal appearance. She is not ill-appearing.  Eyes:     Conjunctiva/sclera: Conjunctivae normal.  Cardiovascular:     Rate and Rhythm: Normal rate and regular rhythm.     Heart sounds: Normal heart sounds. No murmur heard. Pulmonary:     Effort: Pulmonary effort is normal.     Breath sounds: Normal breath sounds. No wheezing.  Musculoskeletal:        General: Normal range of motion.     Cervical back: Normal range of motion.  Skin:    General: Skin is warm.  Neurological:     Mental Status: She is alert. Mental status is at baseline.  Psychiatric:        Mood and Affect: Mood normal.        Behavior: Behavior normal.    Lab Results  Component Value Date   WBC 6.4 08/24/2023   HGB 11.8 (L) 08/24/2023   HCT 34.0 (L) 08/24/2023   PLT 339 08/24/2023   GLUCOSE 89 08/24/2023   ALT 13 08/24/2023   AST 14 (L) 08/24/2023   NA 136 08/24/2023   K 3.9 08/24/2023   CL 104 08/24/2023   CREATININE 0.63 08/24/2023   BUN 10 08/24/2023   CO2 24 08/24/2023   TSH 1.34 04/16/2023   HGBA1C 5.1 06/22/2023      Assessment & Plan:      Abnormal bruising Assessment & Plan: Recurrent bruising without trauma or medication use. Differential includes blood disorders and nutritional deficiencies. Normal liver and kidney function. Recent mononucleosis. - Order PT, INR, PTT, and  peripheral blood smear. - Recheck liver and kidney function tests. - Evaluate nutritional levels: B12, folate, zinc, vitamin K, and vitamin C. - Refer to hematology if initial tests are inconclusive.  Orders: -     Protime-INR -     Pathologist smear review -     APTT -     CBC with Differential/Platelet -     Comprehensive metabolic panel with GFR -     U98 and Folate Panel -     Zinc -     Vitamin C -     Vitamin K1, Serum  Other iron deficiency anemia Assessment & Plan: Significant fluctuation in iron levels with low ferritin. Possible dietary influences on iron metabolism. - Review dietary intake for iron sources. - Include iron studies in blood work. - Consider hematology referral for iron metabolism evaluation if needed.     No orders of the defined types were placed in this encounter.   Orders Placed This Encounter  Procedures   Protime-INR   Pathologist smear review   APTT   CBC with Differential   Comprehensive metabolic panel with GFR   B12 and Folate Panel   Zinc   Vitamin C   Vitamin K1, Serum     Follow-up: Return if symptoms worsen or fail to improve.  An After Visit Summary was printed and given to the patient.  Total time spent on today's visit was 32 minutes, including both face-to-face time and nonface-to-face time personally spent on review of chart (labs and imaging), discussing labs and goals, discussing further work-up, treatment options, referrals to specialist if needed, reviewing outside records if pertinent, answering patient's questions, and coordinating care.    Lajuana Matte, FNP [facility] [phone]

## 2023-09-15 DIAGNOSIS — R42 Dizziness and giddiness: Secondary | ICD-10-CM | POA: Diagnosis not present

## 2023-09-15 DIAGNOSIS — Z419 Encounter for procedure for purposes other than remedying health state, unspecified: Secondary | ICD-10-CM | POA: Diagnosis not present

## 2023-09-15 DIAGNOSIS — R55 Syncope and collapse: Secondary | ICD-10-CM

## 2023-09-17 ENCOUNTER — Encounter: Payer: Self-pay | Admitting: Cardiology

## 2023-09-19 ENCOUNTER — Encounter (HOSPITAL_BASED_OUTPATIENT_CLINIC_OR_DEPARTMENT_OTHER): Payer: Self-pay

## 2023-09-19 ENCOUNTER — Ambulatory Visit (HOSPITAL_BASED_OUTPATIENT_CLINIC_OR_DEPARTMENT_OTHER)
Admission: RE | Admit: 2023-09-19 | Discharge: 2023-09-19 | Disposition: A | Discharge status: Home / Self Care | Source: Ambulatory Visit | Attending: Nurse Practitioner | Admitting: Nurse Practitioner

## 2023-09-19 VITALS — BP 115/74 | HR 85 | Temp 98.7°F | Resp 20

## 2023-09-19 DIAGNOSIS — N898 Other specified noninflammatory disorders of vagina: Secondary | ICD-10-CM | POA: Diagnosis not present

## 2023-09-19 LAB — POCT URINALYSIS DIP (MANUAL ENTRY)
Bilirubin, UA: NEGATIVE
Glucose, UA: NEGATIVE mg/dL
Ketones, POC UA: NEGATIVE mg/dL
Leukocytes, UA: NEGATIVE
Nitrite, UA: NEGATIVE
Protein Ur, POC: NEGATIVE mg/dL
Spec Grav, UA: <=1.005 — AB (ref 1.010–1.025)
Urobilinogen, UA: 0.2 U/dL
pH, UA: 5.5 (ref 5.0–8.0)

## 2023-09-19 LAB — POCT URINE PREGNANCY: Preg Test, Ur: NEGATIVE

## 2023-09-19 NOTE — ED Provider Notes (Signed)
 [name]    CSN: [id number] Arrival date & time: 09/19/23  1801      History   Chief Complaint Chief Complaint  Patient presents with   Vaginal Discharge    Entered by patient    HPI Trinidy Masterson is a 28 y.o. female.   Patient presents requesting evaluation for the new onset of vaginal irritation that has been ongoing for the past several days.  Patient states that initially when the symptoms started she did a 3 day yeast vaginal suppository course without any improvement.  There is no abnormal vaginal discharge states that her vaginal opening only burns.  She also reports increased urinary frequency.  No reported rash or lesions, concern for foreign body.  She is sexually active with 1 female partner.  They do not use protection and and rely on the "pullout" method for contraception.  She is not concerned for sexually transmitted infections.  No reported fever, abdominal pain, vomiting, or diarrhea.  She did have a course of antibiotics approximately 1 month ago that was stopped after the infection was attributed to mono.  The history is provided by the patient.  Vaginal Discharge Associated symptoms: no abdominal pain, no dysuria, no fever and no vomiting     Past Medical History:  Diagnosis Date   Anxiety    Fatigue due to depression 04/12/2023   Left ovarian cyst     Patient Active Problem List   Diagnosis Date Noted   Abnormal bruising 09/14/2023   Rash 08/23/2023   Acute non-recurrent frontal sinusitis 08/16/2023   Pharyngitis 08/16/2023   Flu-like symptoms 08/16/2023   Urinary tract infection without hematuria 07/14/2023   Urinary incontinence without sensory awareness 07/14/2023   Elevated blood sugar 06/22/2023   Vitamin D deficiency 04/20/2023   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   Dizziness 04/16/2023   Numbness and tingling of right leg 04/12/2023   Acute pain of left knee 04/12/2023   Myalgia 04/12/2023   Fatigue due to depression  04/12/2023   GAD (generalized anxiety disorder) 04/12/2023   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 Authorizing Provider  magnesium gluconate (MAGONATE) 500 MG tablet Take 500 mg by mouth 2 (two) times daily.    [provider]  VENTOLIN HFA 108 (90 Base) MCG/ACT inhaler Inhale 2 puffs every 6 (six) hours as needed for wheezing. 08/14/23       Family History Family History  Problem Relation Age of Onset   Healthy Mother    Asthma Father    Hypertension Father    Heart disease Paternal Grandmother    Cancer Paternal  Grandmother    Diabetes Paternal Grandfather     Social History Social History   Tobacco Use   Smoking status: Never   Smokeless tobacco: Never  Vaping Use   Vaping status: Never Used  Substance Use Topics   Alcohol use: Not Currently   Drug use: Never     Allergies   Codeine and Codeine   Review of Systems Review of Systems  Constitutional:  Negative for chills, fatigue and fever.  HENT:  Negative for congestion, rhinorrhea and sore throat.   Eyes:  Negative for pain and redness.  Respiratory:  Negative for cough and shortness of breath.   Cardiovascular:  Negative for chest pain and palpitations.  Gastrointestinal: Negative.  Negative for abdominal pain, diarrhea and vomiting.  Genitourinary:  Positive for  frequency and vaginal pain. Negative for difficulty urinating, dysuria, genital sores, menstrual problem, pelvic pain, urgency, vaginal bleeding and vaginal discharge.  Musculoskeletal:  Negative for back pain and myalgias.  Skin:  Negative for rash.  Neurological:  Negative for dizziness and headaches.     Physical Exam Triage Vital Signs ED Triage Vitals  Encounter Vitals Group     BP 09/19/23 1829 115/74     Systolic BP Percentile --      Diastolic BP Percentile --      Pulse Rate 09/19/23 1829 85     Resp 09/19/23 1829 20     Temp 09/19/23 1829 98.7 F (37.1 C)     Temp Source 09/19/23 1829 Oral     SpO2 09/19/23 1829 98 %     Weight --      Height --      Head Circumference --      Peak Flow --      Pain Score 09/19/23 1834 0     Pain Loc --      Pain Education --      Exclude from Growth Chart --    No data found.  Updated Vital Signs BP 115/74 (BP Location: Right Arm)   Pulse 85   Temp 98.7 F (37.1 C) (Oral)   Resp 20   LMP 08/29/2023 (Exact Date)   SpO2 98%   Physical Exam Vitals and nursing note reviewed.  Constitutional:      Appearance: Normal appearance.  HENT:     Head: Normocephalic.  Cardiovascular:     Rate and Rhythm: Normal rate and regular rhythm.     Heart sounds: Normal heart sounds.  Pulmonary:     Effort: Pulmonary effort is normal.     Breath sounds: Normal breath sounds.  Abdominal:     General: Bowel sounds are normal.  Genitourinary:    Comments: Pelvic exam deferred in the absence of rash, lesions, concern for foreign body, or vaginal bleeding. Skin:    General: Skin is warm and dry.  Neurological:     General: No focal deficit present.     Mental Status: She is alert and oriented to person, place, and time.  Psychiatric:        Mood and Affect: Mood normal.        Behavior: Behavior normal.        Thought Content: Thought content normal.        Judgment: Judgment normal.    UC Treatments / Results  Labs (all labs  ordered are listed, but only abnormal results are displayed) Labs Reviewed  POCT URINALYSIS DIP (MANUAL ENTRY) - Abnormal; Notable for the following components:  Result Value   Color, UA light yellow (*)    Spec Grav, UA <=1.005 (*)    Blood, UA trace-lysed (*)    All other components within normal limits  POCT URINE PREGNANCY - Normal  CERVICOVAGINAL ANCILLARY ONLY    EKG   Radiology No results found.  Procedures Procedures (including critical care time)  Medications Ordered in UC Medications - No data to display  Initial Impression / Assessment and Plan / UC Course  I have reviewed the triage vital signs and the nursing notes.  Pertinent labs & imaging results that were available during my care of the patient were reviewed by me and considered in my medical decision making (see chart for details).    Patient presents for several days of vaginal irritation.  Urinalysis did not demonstrate concerns for an infection.  Pending vaginal swab testing for gonorrhea, chlamydia, trichomonas, bacterial vaginosis, and yeast.  Notified patient that we will let her know of any positive results that would require treatment.     Final Clinical Impressions(s) / UC Diagnoses   Final diagnoses:  Vaginal irritation     Discharge Instructions      We will notify you of any positive testing results and any necessary changes to your treatment plan.    ED Prescriptions   None    PDMP not reviewed this encounter.   Genene Kennel, FNP 09/19/23 1905

## 2023-09-19 NOTE — Discharge Instructions (Addendum)
 We will notify you of any positive testing results and any necessary changes to your treatment plan.

## 2023-09-19 NOTE — ED Triage Notes (Signed)
 Vaginal discharge and irritation since Friday night. Also experiencing urinary urgency. Sexually active with one partner x 2 years.

## 2023-09-20 ENCOUNTER — Telehealth (HOSPITAL_COMMUNITY): Payer: Self-pay

## 2023-09-20 LAB — CERVICOVAGINAL ANCILLARY ONLY
Bacterial Vaginitis (gardnerella): POSITIVE — AB
Candida Glabrata: NEGATIVE
Candida Vaginitis: POSITIVE — AB
Chlamydia: NEGATIVE
Comment: NEGATIVE
Comment: NEGATIVE
Comment: NEGATIVE
Comment: NEGATIVE
Comment: NEGATIVE
Comment: NORMAL
Neisseria Gonorrhea: NEGATIVE
Trichomonas: NEGATIVE

## 2023-09-20 MED ORDER — FLUCONAZOLE 150 MG PO TABS
150.0000 mg | ORAL_TABLET | Freq: Once | ORAL | 0 refills | Status: AC
Start: 2023-09-20 — End: 2023-09-20

## 2023-09-20 MED ORDER — METRONIDAZOLE 500 MG PO TABS
500.0000 mg | ORAL_TABLET | Freq: Two times a day (BID) | ORAL | 0 refills | Status: DC
Start: 2023-09-20 — End: 2023-09-24

## 2023-09-20 NOTE — Telephone Encounter (Signed)
 Per protocol, pt requires tx with metronidazole and Diflucan.  Rx sent to pharmacy on file.

## 2023-09-24 ENCOUNTER — Telehealth (HOSPITAL_COMMUNITY): Payer: Self-pay

## 2023-09-24 ENCOUNTER — Ambulatory Visit: Attending: Cardiovascular Disease | Admitting: Cardiovascular Disease

## 2023-09-24 VITALS — BP 122/78 | HR 92 | Ht 62.0 in | Wt 134.0 lb

## 2023-09-24 DIAGNOSIS — R072 Precordial pain: Secondary | ICD-10-CM

## 2023-09-24 MED ORDER — METRONIDAZOLE 0.75 % VA GEL: 1.0000 | Freq: Every day | VAGINAL | 0 refills | Status: AC

## 2023-09-24 NOTE — Telephone Encounter (Signed)
 Pt requests metrogel  be sent in instead of PO.

## 2023-09-24 NOTE — Progress Notes (Signed)
  Cardiology Office Note:  .   Date:  09/24/2023  ID:  Tricia Allen, DOB [birth year], MRN [id number] PCP: Janece Means, FNP  [facility] Providers Cardiologist:  Jerryl Morin, DO    History of Present Illness: .   Tricia Allen is a 28 y.o. female with generally good health who presents with complaints of chest discomfort at rest.  The discomfort described as a heaviness in her left upper chest and to the right of her sternum.  It does not happen when she exercises.  It is not precipitated by meals and is not associated with coughing or deep breathing.  Currently she has no active complaints.  Palpitations have not bothered her recently.  Also complains of bruises that appear on her lower extremities without any previous trauma.  Her PCP has just performed labs that showed normal platelets and normal coagulation studies.    Studies Reviewed: .       09/14/2023 monitor showed normal findings.  During symptoms the rhythm was sinus tachycardia. 07/27/2022 monitor showed normal findings symptoms are sometimes associated with rare PVCs. 07/27/2022 echo was a normal study.  Personally reviewed the ECG from 06/18/2023 showing mild sinus tachycardia, otherwise normal  Risk Assessment/Calculations:             Physical Exam:   VS:  BP 122/78 (BP Location: Left Arm, Patient Position: Sitting)   Pulse 92   Ht 5\' 2"  (1.575 m)   Wt 134 lb (60.8 kg)   LMP 08/29/2023 (Exact Date)   SpO2 97%   BMI 24.51 kg/m    Wt Readings from Last 3 Encounters:  09/24/23 134 lb (60.8 kg)  09/14/23 135 lb 12.8 oz (61.6 kg)  09/12/23 133 lb (60.3 kg)    GEN: Well nourished, well developed in no acute distress NECK: No JVD; No carotid bruits CARDIAC: RRR, no murmurs, rubs, gallops RESPIRATORY:  Clear to auscultation without rales, wheezing or rhonchi  ABDOMEN: Soft, non-tender, non-distended EXTREMITIES:  No edema; No deformity   ASSESSMENT AND PLAN: .   Chest pain complaints occur  at rest and do not occur during physical exertion.  They do not sound cardiac.  Recent electrocardiogram and echo and monitors performed within the last year are all normal.  Symptoms are likely musculoskeletal.  No additional cardiac workup planned at this time.  Bruising on her lower extremities, not associated with any trauma that she recollects, normal platelet count (272K) and normal PT/APTT on labs recently ordered by her PCP.  Routine chemistries, CBC, vitamin C, folic acid, B12 and zinc  levels are also all normal.       Dispo: Follow-up as needed with Dr. Emmette Harms  Signed, Luana Rumple, MD

## 2023-09-24 NOTE — Patient Instructions (Signed)
 Medication Instructions:  No changes *If you need a refill on your cardiac medications before your next appointment, please call your pharmacy* Follow-Up: At [facility], you and your health needs are our priority.  As part of our continuing mission to provide you with exceptional heart care, our providers are all part of one team.  This team includes your primary Cardiologist (physician) and Advanced Practice Providers or APPs (Physician Assistants and Nurse Practitioners) who all work together to provide you with the care you need, when you need it.  Your next appointment:   Follow up as needed with Dr Emmette Harms  We recommend signing up for the patient portal called "MyChart".  Sign up information is provided on this After Visit Summary.  MyChart is used to connect with patients for Virtual Visits (Telemedicine).  Patients are able to view lab/test results, encounter notes, upcoming appointments, etc.  Non-urgent messages can be sent to your provider as well.   To learn more about what you can do with MyChart, go to [email or web address].        1st Floor: - Lobby - Registration  - Pharmacy  - Lab - Cafe  2nd Floor: - PV Lab - Diagnostic Testing (echo, CT, nuclear med)  3rd Floor: - Vacant  4th Floor: - TCTS (cardiothoracic surgery) - AFib Clinic - Structural Heart Clinic - Vascular Surgery  - Vascular Ultrasound  5th Floor: - HeartCare Cardiology (general and EP) - Clinical Pharmacy for coumadin, hypertension, lipid, weight-loss medications, and med management appointments    Valet parking services will be available as well.

## 2023-09-27 LAB — COMPREHENSIVE METABOLIC PANEL WITH GFR
ALT: 8 IU/L (ref 0–32)
AST: 15 IU/L (ref 0–40)
Albumin: 4.9 g/dL (ref 4.0–5.0)
Alkaline Phosphatase: 52 IU/L (ref 44–121)
BUN/Creatinine Ratio: 10 (ref 9–23)
BUN: 8 mg/dL (ref 6–20)
Bilirubin Total: 0.6 mg/dL (ref 0.0–1.2)
CO2: 21 mmol/L (ref 20–29)
Calcium: 9.7 mg/dL (ref 8.7–10.2)
Chloride: 102 mmol/L (ref 96–106)
Creatinine, Ser: 0.77 mg/dL (ref 0.57–1.00)
Globulin, Total: 2.4 g/dL (ref 1.5–4.5)
Glucose: 82 mg/dL (ref 70–99)
Potassium: 4.4 mmol/L (ref 3.5–5.2)
Sodium: 138 mmol/L (ref 134–144)
Total Protein: 7.3 g/dL (ref 6.0–8.5)
eGFR: 108 mL/min/{1.73_m2} (ref >59)

## 2023-09-27 LAB — CBC WITH DIFFERENTIAL/PLATELET
Basophils Absolute: 0 10*3/uL (ref 0.0–0.2)
Basos: 0 %
EOS (ABSOLUTE): 0.1 10*3/uL (ref 0.0–0.4)
Eos: 1 %
Hematocrit: 38.4 % (ref 34.0–46.6)
Hemoglobin: 12.9 g/dL (ref 11.1–15.9)
Immature Grans (Abs): 0 10*3/uL (ref 0.0–0.1)
Immature Granulocytes: 0 %
Lymphocytes Absolute: 1.7 10*3/uL (ref 0.7–3.1)
Lymphs: 34 %
MCH: 31.6 pg (ref 26.6–33.0)
MCHC: 33.6 g/dL (ref 31.5–35.7)
MCV: 94 fL (ref 79–97)
Monocytes Absolute: 0.4 10*3/uL (ref 0.1–0.9)
Monocytes: 7 %
Neutrophils Absolute: 2.8 10*3/uL (ref 1.4–7.0)
Neutrophils: 58 %
Platelets: 272 10*3/uL (ref 150–450)
RBC: 4.08 x10E6/uL (ref 3.77–5.28)
RDW: 12.2 % (ref 11.7–15.4)
WBC: 4.9 10*3/uL (ref 3.4–10.8)

## 2023-09-27 LAB — ZINC: Zinc: 66 ug/dL (ref 44–115)

## 2023-09-27 LAB — B12 AND FOLATE PANEL
Folate: 17.5 ng/mL (ref >3.0)
Vitamin B-12: 459 pg/mL (ref 232–1245)

## 2023-09-27 LAB — APTT: aPTT: 28 s (ref 24–33)

## 2023-09-27 LAB — VITAMIN K1, SERUM: VITAMIN K1: 0.66 ng/mL (ref 0.10–2.20)

## 2023-09-27 LAB — PROTIME-INR
INR: 1 (ref 0.9–1.2)
Prothrombin Time: 11.1 s (ref 9.1–12.0)

## 2023-09-27 LAB — VITAMIN C: Vitamin C: 0.8 mg/dL (ref 0.4–2.0)

## 2023-09-28 DIAGNOSIS — F411 Generalized anxiety disorder: Secondary | ICD-10-CM | POA: Diagnosis not present

## 2023-09-28 DIAGNOSIS — M2560 Stiffness of unspecified joint, not elsewhere classified: Secondary | ICD-10-CM | POA: Diagnosis not present

## 2023-09-28 DIAGNOSIS — M4143 Neuromuscular scoliosis, cervicothoracic region: Secondary | ICD-10-CM | POA: Diagnosis not present

## 2023-09-28 DIAGNOSIS — R293 Abnormal posture: Secondary | ICD-10-CM | POA: Diagnosis not present

## 2023-09-28 DIAGNOSIS — M6281 Muscle weakness (generalized): Secondary | ICD-10-CM | POA: Diagnosis not present

## 2023-09-28 DIAGNOSIS — M545 Low back pain, unspecified: Secondary | ICD-10-CM | POA: Diagnosis not present

## 2023-10-12 DIAGNOSIS — M2560 Stiffness of unspecified joint, not elsewhere classified: Secondary | ICD-10-CM | POA: Diagnosis not present

## 2023-10-12 DIAGNOSIS — R293 Abnormal posture: Secondary | ICD-10-CM | POA: Diagnosis not present

## 2023-10-12 DIAGNOSIS — M4143 Neuromuscular scoliosis, cervicothoracic region: Secondary | ICD-10-CM | POA: Diagnosis not present

## 2023-10-12 DIAGNOSIS — M6281 Muscle weakness (generalized): Secondary | ICD-10-CM | POA: Diagnosis not present

## 2023-10-12 DIAGNOSIS — F411 Generalized anxiety disorder: Secondary | ICD-10-CM | POA: Diagnosis not present

## 2023-10-12 DIAGNOSIS — M545 Low back pain, unspecified: Secondary | ICD-10-CM | POA: Diagnosis not present

## 2023-10-15 DIAGNOSIS — Z419 Encounter for procedure for purposes other than remedying health state, unspecified: Secondary | ICD-10-CM | POA: Diagnosis not present

## 2023-10-17 NOTE — Progress Notes (Unsigned)
 [facility] [address] [place] [address] Phone: [phone] Subjective:   IBryan Caprio, am serving as a scribe for Dr. Ronnell Coins.  I'm seeing this patient by the request  of:  Janece Means, FNP  CC: Low back and neck pain follow-up  AOZ:[id number]  Tricia Allen is a 28 y.o. female coming in with complaint of back and neck pain. OMT on 07/11/2023. Patient states same per usual. No new concerns.  Medications patient has been prescribed:   Taking:         Reviewed prior external information including notes and imaging from previsou exam, outside providers and external EMR if available.   As well as notes that were available from care everywhere and other healthcare systems.  Past medical history, social, surgical and family history all reviewed in electronic medical record.  No pertanent information unless stated regarding to the chief complaint.   Past Medical History:  Diagnosis Date   Anxiety    Fatigue due to depression 04/12/2023   Left ovarian cyst     Allergies  Allergen Reactions   Codeine Other (See Comments)    Hallucinations   Codeine Other (See Comments)    Hallucinations     Review of Systems:  No headache, visual changes, nausea, vomiting, diarrhea, constipation, dizziness, skin rash, fevers, chills, night sweats, weight loss, swollen lymph nodes, body aches, joint swelling, chest pain, shortness of breath, mood changes. POSITIVE muscle aches, abdominal pain  Objective  Blood pressure 108/64, pulse 95, height 5\' 2"  (1.575 m), last menstrual period 09/27/2023, SpO2 98%, not currently breastfeeding.   General: No apparent distress alert and oriented x3 mood and affect normal, dressed appropriately.  HEENT: Pupils equal, extraocular movements intact  Respiratory: Patient's speak in full sentences and does not appear short of breath  Cardiovascular: No lower extremity edema, non tender, no erythema   Gait normal.  Abdominal exam is unremarkable for any masses or voluntary or involuntary guarding noted. MSK:  Back does have some loss of lordosis noted.  Some tenderness to palpation in the paraspinal musculature.  Tightness with Veldon German right greater than left.  Osteopathic findings  C2 flexed rotated and side bent right C6 flexed rotated and side bent left T3 extended rotated and side bent right inhaled rib T11 extended rotated and side bent left L3 flexed rotated and side bent right Sacrum right on right       Assessment and Plan:  Myalgia Continue to have mild GERD noted.  Will examine the emergency room with potential chest discomfort can fluctuate.  Will get similar laboratory workup noted.  Do believe there are some underlying generalized anxiety disorder that is also contributing.  Awaiting genetic testing on this to see which type of medication she would respond to.  Refill Cymbalta as well as Flexeril  could be beneficial with patient chronic pains.  Patient will consider it.  Follow-up with me again 6 to 8 weeks.  Right upper quadrant pain Will get GGT and lipase.  Has had them previously and were not abnormal.  Need to consider the possibility of an abdominal ultrasound.  Worsening pain discussed about seeking medical attention immediately.    Nonallopathic problems  Decision today to treat with OMT was based on Physical Exam  After verbal consent patient was treated with HVLA, ME, FPR techniques in cervical, rib, thoracic, lumbar, and sacral  areas  Patient tolerated the procedure well with improvement in symptoms  Patient given exercises,  stretches and lifestyle modifications  See medications in patient instructions if given  Patient will follow up in 4-8 weeks    The above documentation has been reviewed and is accurate and complete Shahan Starks M Shaguana Love, DO          Note: This dictation was prepared with Dragon dictation along with smaller phrase technology.  Any transcriptional errors that result from this process are unintentional.

## 2023-10-18 ENCOUNTER — Encounter (HOSPITAL_BASED_OUTPATIENT_CLINIC_OR_DEPARTMENT_OTHER): Payer: Self-pay | Admitting: Emergency Medicine

## 2023-10-18 ENCOUNTER — Emergency Department (HOSPITAL_BASED_OUTPATIENT_CLINIC_OR_DEPARTMENT_OTHER)
Admission: EM | Admit: 2023-10-18 | Discharge: 2023-10-18 | Disposition: A | Discharge status: Home / Self Care | Attending: Emergency Medicine | Admitting: Emergency Medicine

## 2023-10-18 ENCOUNTER — Emergency Department (HOSPITAL_BASED_OUTPATIENT_CLINIC_OR_DEPARTMENT_OTHER)

## 2023-10-18 ENCOUNTER — Other Ambulatory Visit: Payer: Self-pay

## 2023-10-18 DIAGNOSIS — R0789 Other chest pain: Secondary | ICD-10-CM | POA: Insufficient documentation

## 2023-10-18 DIAGNOSIS — R55 Syncope and collapse: Secondary | ICD-10-CM | POA: Diagnosis not present

## 2023-10-18 DIAGNOSIS — R42 Dizziness and giddiness: Secondary | ICD-10-CM | POA: Diagnosis not present

## 2023-10-18 DIAGNOSIS — R079 Chest pain, unspecified: Secondary | ICD-10-CM | POA: Diagnosis not present

## 2023-10-18 LAB — CBC
HCT: 36.8 % (ref 36.0–46.0)
Hemoglobin: 12.6 g/dL (ref 12.0–15.0)
MCH: 31.7 pg (ref 26.0–34.0)
MCHC: 34.2 g/dL (ref 30.0–36.0)
MCV: 92.5 fL (ref 80.0–100.0)
Platelets: 251 10*3/uL (ref 150–400)
RBC: 3.98 MIL/uL (ref 3.87–5.11)
RDW: 11.9 % (ref 11.5–15.5)
WBC: 5.7 10*3/uL (ref 4.0–10.5)
nRBC: 0 % (ref 0.0–0.2)

## 2023-10-18 LAB — BASIC METABOLIC PANEL WITH GFR
Anion gap: 12 (ref 5–15)
BUN: 9 mg/dL (ref 6–20)
CO2: 23 mmol/L (ref 22–32)
Calcium: 9.8 mg/dL (ref 8.9–10.3)
Chloride: 102 mmol/L (ref 98–111)
Creatinine, Ser: 0.74 mg/dL (ref 0.44–1.00)
GFR, Estimated: >60 mL/min (ref >60)
Glucose, Bld: 137 mg/dL — ABNORMAL HIGH (ref 70–99)
Potassium: 3.8 mmol/L (ref 3.5–5.1)
Sodium: 138 mmol/L (ref 135–145)

## 2023-10-18 LAB — TROPONIN T, HIGH SENSITIVITY
Troponin T High Sensitivity: <15 ng/L (ref <19)
Troponin T High Sensitivity: <15 ng/L (ref <19)

## 2023-10-18 LAB — PREGNANCY, URINE: Preg Test, Ur: NEGATIVE

## 2023-10-18 NOTE — ED Triage Notes (Signed)
 Lightheadedness, off balance , near syncope with ambulation , shortness of breath , Hx anxiety . Chest pain under her right ribs . Not on anxiety meds .

## 2023-10-18 NOTE — ED Notes (Signed)
 Pt alert and oriented X 4 at the time of discharge. RR even and unlabored. No acute distress noted. Pt verbalized understanding of discharge instructions as discussed. Pt ambulatory to lobby at time of discharge.

## 2023-10-18 NOTE — ED Provider Notes (Addendum)
 [facility] Provider Note   CSN: [id number] Arrival date & time: 10/18/23  1358     History  Chief Complaint  Patient presents with   Near Syncope    Tricia Allen is a 28 y.o. female.  Patient has had similar symptoms in the past but here today's had some chest discomfort substernal earlier in the day but the main concern was that she felt as if she was going to pass out.  Had some tingling sensation in her face as well.  Felt a little off balance felt lightheaded.  Patient has had chest discomfort in the past has seen cardiology for this recently in April.  They decided no additional workup from the workups have done in the past.  Patient without chest pain now but she did have chest pain a few hours ago.  No nausea no vomiting.  No room spinning.  Past medical history is significant for anxiety left ovarian cyst fatigue due to depression.  Patient is never used tobacco products.       Home Medications Prior to Admission medications   Medication Sig Start Date End Date Taking? Authorizing Provider  magnesium gluconate (MAGONATE) 500 MG tablet Take 500 mg by mouth 2 (two) times daily.    [provider]  VENTOLIN  HFA 108 (90 Base) MCG/ACT inhaler Inhale 2 puffs every 6 (six) hours as needed for wheezing. 08/14/23         Allergies    Codeine and Codeine    Review of Systems   Review of Systems  Constitutional:  Negative for chills and fever.  HENT:  Negative for ear pain and sore throat.   Eyes:  Negative for pain and visual disturbance.  Respiratory:  Negative for cough and shortness of breath.   Cardiovascular:  Positive for chest pain. Negative for palpitations.  Gastrointestinal:  Negative for abdominal pain and vomiting.  Genitourinary:  Negative for dysuria and hematuria.  Musculoskeletal:  Negative for arthralgias and back pain.  Skin:  Negative for color change and rash.  Neurological:  Positive for  light-headedness and numbness. Negative for seizures, syncope and headaches.  All other systems reviewed and are negative.   Physical Exam Updated Vital Signs BP 107/66   Pulse 84   Temp 98.8 F (37.1 C) (Oral)   Resp 14   Wt 60.3 kg   LMP 09/27/2023 (Exact Date)   SpO2 100%   BMI 24.33 kg/m  Physical Exam Vitals and nursing note reviewed.  Constitutional:      General: She is not in acute distress.    Appearance: Normal appearance. She is well-developed. She is not ill-appearing.  HENT:     Head: Normocephalic and atraumatic.  Eyes:     Extraocular Movements: Extraocular movements intact.     Conjunctiva/sclera: Conjunctivae normal.     Pupils: Pupils are equal, round, and reactive to light.  Cardiovascular:     Rate and Rhythm: Normal rate and regular rhythm.     Heart sounds: No murmur heard. Pulmonary:     Effort: Pulmonary effort is normal. No respiratory distress.     Breath sounds: Normal breath sounds. No wheezing.  Chest:     Chest wall: No tenderness.  Abdominal:     Palpations: Abdomen is soft.     Tenderness: There is no abdominal tenderness.  Musculoskeletal:        General: No swelling.     Cervical back: Normal range of motion and  neck supple.     Right lower leg: No edema.     Left lower leg: No edema.  Skin:    General: Skin is warm and dry.     Capillary Refill: Capillary refill takes less than 2 seconds.  Neurological:     General: No focal deficit present.     Mental Status: She is alert and oriented to person, place, and time.     Cranial Nerves: No cranial nerve deficit.     Sensory: No sensory deficit.     Motor: No weakness.  Psychiatric:        Mood and Affect: Mood normal.     ED Results / Procedures / Treatments   Labs (all labs ordered are listed, but only abnormal results are displayed) Labs Reviewed  BASIC METABOLIC PANEL WITH GFR - Abnormal; Notable for the following components:      Result Value   Glucose, Bld 137 (*)     All other components within normal limits  CBC  PREGNANCY, URINE  TROPONIN T, HIGH SENSITIVITY  TROPONIN T, HIGH SENSITIVITY    EKG EKG Interpretation Date/Time:  Thursday Oct 18 2023 14:12:25 EDT Ventricular Rate:  92 PR Interval:  143 QRS Duration:  83 QT Interval:  330 QTC Calculation: 409 R Axis:   62  Text Interpretation: Sinus rhythm Confirmed by Jette Lewan [phone]) on 10/18/2023 5:17:02 PM  Radiology CT Head Wo Contrast Result Date: 10/18/2023 CLINICAL DATA:  Syncope/presyncope, cerebrovascular cause suspected. Lightheaded. EXAM: CT HEAD WITHOUT CONTRAST TECHNIQUE: Contiguous axial images were obtained from the base of the skull through the vertex without intravenous contrast. RADIATION DOSE REDUCTION: This exam was performed according to the departmental dose-optimization program which includes automated exposure control, adjustment of the mA and/or kV according to patient size and/or use of iterative reconstruction technique. COMPARISON:  02/25/2023 FINDINGS: Brain: No acute intracranial abnormality. Specifically, no hemorrhage, hydrocephalus, mass lesion, acute infarction, or significant intracranial injury. Vascular: No hyperdense vessel or unexpected calcification. Skull: No acute calvarial abnormality. Sinuses/Orbits: No acute findings Other: None IMPRESSION: No acute intracranial abnormality. Electronically Signed   By: Janeece Mechanic M.D.   On: 10/18/2023 18:40   DG Chest 2 View Result Date: 10/18/2023 CLINICAL DATA:  Chest pain. EXAM: CHEST - 2 VIEW COMPARISON:  March 20, 2023. FINDINGS: The heart size and mediastinal contours are within normal limits. Both lungs are clear. The visualized skeletal structures are unremarkable. IMPRESSION: No active cardiopulmonary disease. Electronically Signed   By: Rosalene Colon M.D.   On: 10/18/2023 15:22    Procedures Procedures    Medications Ordered in ED Medications - No data to display  ED Course/ Medical Decision  Making/ A&P                                 Medical Decision Making Amount and/or Complexity of Data Reviewed Labs: ordered. Radiology: ordered.   Patient with any chest pain now but did have some chest pain a few hours ago so delta troponin is important.  Clinically not really concerned about pulmonary embolus does not have similar symptoms associated with that but pulse is up a little bit at 103 oxygen levels are very good.  Patient's had some of the symptoms in the past so that also makes pulmonary embolus unlikely.  Patient has not had a head CT recently.  And she has felt off balance felt lightheaded.  That was her main  concern.  Was not overly concerned about the chest pain.    Basic metabolic panel normal.  CBC is normal initial troponin less than 15 pregnancy test negative and two-view chest without any acute findings.  An EKG was sinus rhythm.  Head CT without any acute findings.  Delta troponin less than 15.  No significant acute cardiac event no evidence of any significant abnormalities in the head CT.  Patient stable for discharge home.  Will give work note and have her follow-up with her primary care doctor. Final Clinical Impression(s) / ED Diagnoses Final diagnoses:  Near syncope  Atypical chest pain    Rx / DC Orders ED Discharge Orders     None         Nicklas Barns, MD 10/18/23 1727    Nicklas Barns, MD 10/18/23 Larita Pluck

## 2023-10-18 NOTE — Discharge Instructions (Signed)
 Workup here today without any acute lab abnormalities.  Your cardiac enzymes were normal x 2.  No evidence of acute cardiac event.  Head CT was negative.  Would recommend following back up with primary care doctor for consideration for additional workup.  Return for any new or worse symptoms.  Work note provided.

## 2023-10-19 ENCOUNTER — Encounter: Payer: Self-pay | Admitting: Family Medicine

## 2023-10-19 ENCOUNTER — Ambulatory Visit: Admitting: Family Medicine

## 2023-10-19 ENCOUNTER — Ambulatory Visit (INDEPENDENT_AMBULATORY_CARE_PROVIDER_SITE_OTHER): Admitting: Family Medicine

## 2023-10-19 VITALS — BP 100/62 | HR 71 | Temp 98.3°F | Resp 16 | Ht 62.0 in | Wt 130.8 lb

## 2023-10-19 VITALS — BP 108/64 | HR 95 | Ht 62.0 in

## 2023-10-19 DIAGNOSIS — R1011 Right upper quadrant pain: Secondary | ICD-10-CM

## 2023-10-19 DIAGNOSIS — M2550 Pain in unspecified joint: Secondary | ICD-10-CM

## 2023-10-19 DIAGNOSIS — M9902 Segmental and somatic dysfunction of thoracic region: Secondary | ICD-10-CM | POA: Diagnosis not present

## 2023-10-19 DIAGNOSIS — R42 Dizziness and giddiness: Secondary | ICD-10-CM

## 2023-10-19 DIAGNOSIS — M9908 Segmental and somatic dysfunction of rib cage: Secondary | ICD-10-CM | POA: Diagnosis not present

## 2023-10-19 DIAGNOSIS — R739 Hyperglycemia, unspecified: Secondary | ICD-10-CM | POA: Diagnosis not present

## 2023-10-19 DIAGNOSIS — M9904 Segmental and somatic dysfunction of sacral region: Secondary | ICD-10-CM

## 2023-10-19 DIAGNOSIS — F411 Generalized anxiety disorder: Secondary | ICD-10-CM | POA: Diagnosis not present

## 2023-10-19 DIAGNOSIS — M9903 Segmental and somatic dysfunction of lumbar region: Secondary | ICD-10-CM

## 2023-10-19 DIAGNOSIS — M9901 Segmental and somatic dysfunction of cervical region: Secondary | ICD-10-CM

## 2023-10-19 DIAGNOSIS — M791 Myalgia, unspecified site: Secondary | ICD-10-CM

## 2023-10-19 LAB — COMPREHENSIVE METABOLIC PANEL WITH GFR
ALT: 10 U/L (ref 0–35)
AST: 14 U/L (ref 0–37)
Albumin: 5 g/dL (ref 3.5–5.2)
Alkaline Phosphatase: 37 U/L — ABNORMAL LOW (ref 39–117)
BUN: 11 mg/dL (ref 6–23)
CO2: 27 meq/L (ref 19–32)
Calcium: 9.4 mg/dL (ref 8.4–10.5)
Chloride: 102 meq/L (ref 96–112)
Creatinine, Ser: 0.77 mg/dL (ref 0.40–1.20)
GFR: 105.65 mL/min (ref >60.00)
Glucose, Bld: 81 mg/dL (ref 70–99)
Potassium: 3.9 meq/L (ref 3.5–5.1)
Sodium: 137 meq/L (ref 135–145)
Total Bilirubin: 0.9 mg/dL (ref 0.2–1.2)
Total Protein: 7.7 g/dL (ref 6.0–8.3)

## 2023-10-19 LAB — IBC PANEL
Iron: 105 ug/dL (ref 42–145)
Saturation Ratios: 26 % (ref 20.0–50.0)
TIBC: 404.6 ug/dL (ref 250.0–450.0)
Transferrin: 289 mg/dL (ref 212.0–360.0)

## 2023-10-19 LAB — GAMMA GT: GGT: 6 U/L — ABNORMAL LOW (ref 7–51)

## 2023-10-19 LAB — MAGNESIUM: Magnesium: 2 mg/dL (ref 1.5–2.5)

## 2023-10-19 LAB — LIPASE: Lipase: 31 U/L (ref 11.0–59.0)

## 2023-10-19 NOTE — Assessment & Plan Note (Signed)
 Continue to have mild GERD noted.  Will examine the emergency room with potential chest discomfort can fluctuate.  Will get similar laboratory workup noted.  Do believe there are some underlying generalized anxiety disorder that is also contributing.  Awaiting genetic testing on this to see which type of medication she would respond to.  Refill Cymbalta as well as Flexeril  could be beneficial with patient chronic pains.  Patient will consider it.  Follow-up with me again 6 to 8 weeks.

## 2023-10-19 NOTE — Assessment & Plan Note (Signed)
 Will get GGT and lipase.  Has had them previously and were not abnormal.  Need to consider the possibility of an abdominal ultrasound.  Worsening pain discussed about seeking medical attention immediately.

## 2023-10-19 NOTE — Patient Instructions (Signed)
 Labs today After testing cross reference with cymbalta or effexor Happy Clovis Dar to your [place] player See you again in 6 weeks

## 2023-10-19 NOTE — Progress Notes (Signed)
 Subjective:  Patient ID: Tricia Allen, female    DOB: [birth year]  Age: 28 y.o. MRN: [id number]  Chief Complaint  Patient presents with   Dizziness   Anxiety   Discussed the use of AI scribe software for clinical note transcription with the patient, who gave verbal consent to proceed.  History of Present Illness   Tricia Allen is a 28 year old female who presents with dizziness, balance issues, and facial flushing.  She experiences episodes of facial flushing, describing her face as feeling 'on fire' without any apparent trigger. These episodes occur randomly and are accompanied by dizziness and lightheadedness. She feels off balance, as if walking tilted or sideways, and has difficulty focusing her eyes, which exacerbates the dizziness. These symptoms have been persistent and troubling, affecting her daily activities, including walking and driving.  She visited the emergency room the previous day due to these symptoms, where a CT scan and blood work were performed, both of which returned normal results. Despite this, her symptoms persist, and she continues to feel dizzy, especially when looking down or moving her head while driving.  She is currently taking magnesium glycinate once daily in the morning, although the recommended dose is twice daily.  She also experiences intermittent sharp pain under her right rib, which radiates to her upper back. This pain is not constant but occurs sporadically, sometimes waking her at night. She initially thought it might be gas-related and has been taking Gas-X daily without relief.  Additionally, she noted a blood sugar level of 137 mg/dL at the hospital after consuming only water and a small amount of food, which she found concerning given her normal A1c levels. She is not currently monitoring her blood sugar regularly.  No history of diabetes and has normal A1c levels. She experiences dizziness, difficulty focusing, and balance  issues, but no other significant symptoms were noted in the review of systems.          07/13/2023    8:40 AM 04/12/2023    3:48 PM 03/27/2022    9:26 AM  Depression screen PHQ 2/9  Decreased Interest 1 2 0  Down, Depressed, Hopeless 1 2 0  PHQ - 2 Score 2 4 0  Altered sleeping 0 1 0  Tired, decreased energy 1 2 2   Change in appetite 1 3 0  Feeling bad or failure about yourself  0 0 0  Trouble concentrating 1 3 0  Moving slowly or fidgety/restless 0 0 0  Suicidal thoughts 0 0 0  PHQ-9 Score 5 13 2   Difficult doing work/chores  Very difficult         09/12/2023    2:33 PM  Fall Risk   Falls in the past year? 0  Number falls in past yr: 0  Injury with Fall? 0  Follow up Falls evaluation completed    Patient Care Team: Janece Means, FNP as PCP - General (Family Medicine) Kiki Pelton, MD as PCP - OBGYN (Obstetrics and Gynecology) Jerryl Morin, DO as PCP - Cardiology (Cardiology) Isidro Margo, DO as Consulting Physician (Family Medicine) Janece Means, FNP (Family Medicine) Jhonny Moss, MD as Consulting Physician (Neurology)   Review of Systems  Constitutional:  Negative for chills, diaphoresis, fatigue and fever.  HENT:  Negative for congestion, ear pain and sinus pain.   Eyes: Negative.   Respiratory:  Negative for cough and shortness of breath.   Cardiovascular:  Negative for chest pain.  Gastrointestinal:  Negative  for abdominal pain, constipation, nausea and vomiting.  Endocrine: Negative.   Genitourinary:  Negative for dysuria.  Musculoskeletal:  Negative for arthralgias.  Allergic/Immunologic: Negative.   Neurological:  Positive for dizziness. Negative for weakness and headaches.  Hematological: Negative.   Psychiatric/Behavioral:  Negative for dysphoric mood. The patient is not nervous/anxious.     Current Outpatient Medications on File Prior to Visit  Medication Sig Dispense Refill   magnesium gluconate (MAGONATE) 500 MG tablet Take 500 mg  by mouth 2 (two) times daily.     VENTOLIN  HFA 108 (90 Base) MCG/ACT inhaler Inhale 2 puffs every 6 (six) hours as needed for wheezing. 18 g 0   No current facility-administered medications on file prior to visit.   Past Medical History:  Diagnosis Date   Anxiety    Fatigue due to depression 04/12/2023   Left ovarian cyst    Past Surgical History:  Procedure Laterality Date   TONSILLECTOMY AND ADENOIDECTOMY      Family History  Problem Relation Age of Onset   Healthy Mother    Asthma Father    Hypertension Father    Heart disease Paternal Grandmother    Cancer Paternal Grandmother    Diabetes Paternal Grandfather    Social History   Socioeconomic History   Marital status: Single    Spouse name: Not on file   Number of children: Not on file   Years of education: Not on file   Highest education level: 12th grade  Occupational History   Not on file  Tobacco Use   Smoking status: Never   Smokeless tobacco: Never  Vaping Use   Vaping status: Never Used  Substance and Sexual Activity   Alcohol use: Not Currently   Drug use: Never   Sexual activity: Not Currently    Birth control/protection: None  Other Topics Concern   Not on file  Social History Narrative   ** Merged History Encounter **    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 boyfriend and two kids   Caffeine  One soda a day   Social Drivers of Health   Financial Resource Strain: Low Risk  (06/22/2023)   Overall Financial Resource Strain (CARDIA)    Difficulty of Paying Living Expenses: Not hard at all  Food Insecurity: No Food Insecurity (06/22/2023)   Hunger Vital Sign    Worried About Running Out of Food in the Last Year: Never true    Ran Out of Food in the Last Year: Never true  Transportation Needs: No Transportation Needs (06/22/2023)   PRAPARE - Administrator, Civil Service (Medical): No    Lack of Transportation (Non-Medical): No  Physical Activity: Inactive (06/22/2023)   Exercise  Vital Sign    Days of Exercise per Week: 0 days    Minutes of Exercise per Session: 0 min  Stress: Stress Concern Present (06/22/2023)   [employer] of Occupational Health - Occupational Stress Questionnaire    Feeling of Stress : Rather much  Social Connections: Moderately Isolated (06/22/2023)   Social Connection and Isolation Panel [NHANES]    Frequency of Communication with Friends and Family: More than three times a week    Frequency of Social Gatherings with Friends and Family: Three times a week    Attends Religious Services: Never    Active Member of Clubs or Organizations: No    Attends Banker Meetings: Never    Marital Status: Living with partner    Objective:  BP 100/62   Pulse 71   Temp 98.3 F (36.8 C) (Temporal)   Resp 16   Ht 5\' 2"  (1.575 m)   Wt 130 lb 12.8 oz (59.3 kg)   LMP 09/27/2023 (Exact Date)   SpO2 98%   BMI 23.92 kg/m      10/19/2023    8:05 AM 10/18/2023    7:15 PM 10/18/2023    7:00 PM  BP/Weight  Systolic BP 100 103 107  Diastolic BP 62 70 63  Wt. (Lbs) 130.8    BMI 23.92 kg/m2      Physical Exam Vitals reviewed. Exam conducted with a chaperone present.  Constitutional:      General: She is not in acute distress.    Appearance: Normal appearance. She is not ill-appearing.  Eyes:     Conjunctiva/sclera: Conjunctivae normal.  Cardiovascular:     Rate and Rhythm: Normal rate and regular rhythm.     Heart sounds: Normal heart sounds. No murmur heard. Pulmonary:     Effort: Pulmonary effort is normal.     Breath sounds: Normal breath sounds. No wheezing.  Chest:  Breasts:    Breasts are symmetrical.     Right: Normal.     Left: Normal.  Abdominal:     General: Bowel sounds are normal.     Palpations: Abdomen is soft.     Tenderness: There is no abdominal tenderness.  Genitourinary:    Vagina: Normal.     Cervix: Normal.     Uterus: Normal.      Adnexa: Right adnexa normal.     Rectum: Normal.  Neurological:      Mental Status: She is alert. Mental status is at baseline.  Psychiatric:        Mood and Affect: Mood normal.        Behavior: Behavior normal.    Lab Results  Component Value Date   WBC 5.7 10/18/2023   HGB 12.6 10/18/2023   HCT 36.8 10/18/2023   PLT 251 10/18/2023   GLUCOSE 137 (H) 10/18/2023   ALT 8 09/14/2023   AST 15 09/14/2023   NA 138 10/18/2023   K 3.8 10/18/2023   CL 102 10/18/2023   CREATININE 0.74 10/18/2023   BUN 9 10/18/2023   CO2 23 10/18/2023   TSH 1.34 04/16/2023   INR 1.0 09/14/2023   HGBA1C 5.1 06/22/2023      Assessment & Plan:  Dizziness Assessment & Plan: Intermittent dizziness and balance issues with vertigo and difficulty focusing. CT scan and blood work normal at ED. Differential includes vestibular dysfunction or neurological issues. Possible hypermagnesemia due to supplementation discussed. - Check magnesium levels to rule out hypermagnesemia. - Refer to an endocrinologist for further evaluation of dizziness and potential endocrine causes.  Orders: -     Magnesium -     Ambulatory referral to Endocrinology  Elevated serum glucose Assessment & Plan: Elevated blood glucose level of 137 mg/dL with normal Z6X. Insulin dysregulation and pituitary gland role discussed. Considered continuous glucose monitoring. - Refer to an endocrinologist for further evaluation of elevated blood glucose and potential endocrine causes.  Orders: -     Ambulatory referral to Endocrinology  GAD (generalized anxiety disorder) Assessment & Plan: Stable - Taking magnesium 500 mg TWICE A DAY  - check levels and sent genesight testing    Right upper quadrant pain Assessment & Plan: Intermittent sharp pain under right rib, radiating to back. Differential includes hepatic or gastrointestinal causes. Gas-X ineffective. - Consider further  imaging or evaluation if symptoms persist.        No orders of the defined types were placed in this encounter.   Orders  Placed This Encounter  Procedures   Magnesium   Ambulatory referral to Endocrinology     Follow-up: No follow-ups on file.  An After Visit Summary was printed and given to the patient.  Total time spent on today's visit was 32 minutes, including both face-to-face time and nonface-to-face time personally spent on review of chart (labs and imaging), discussing labs and goals, discussing further work-up, treatment options, referrals to specialist if needed, and answering patient's questions.  Delford Felling, FNP [facility] [phone]

## 2023-10-19 NOTE — Assessment & Plan Note (Signed)
 Stable - Taking magnesium 500 mg TWICE A DAY  - check levels and sent genesight testing

## 2023-10-19 NOTE — Assessment & Plan Note (Signed)
 Intermittent dizziness and balance issues with vertigo and difficulty focusing. CT scan and blood work normal at ED. Differential includes vestibular dysfunction or neurological issues. Possible hypermagnesemia due to supplementation discussed. - Check magnesium levels to rule out hypermagnesemia. - Refer to an endocrinologist for further evaluation of dizziness and potential endocrine causes.

## 2023-10-19 NOTE — Assessment & Plan Note (Signed)
 Elevated blood glucose level of 137 mg/dL with normal J4N. Insulin dysregulation and pituitary gland role discussed. Considered continuous glucose monitoring. - Refer to an endocrinologist for further evaluation of elevated blood glucose and potential endocrine causes.

## 2023-10-19 NOTE — Assessment & Plan Note (Signed)
 Intermittent sharp pain under right rib, radiating to back. Differential includes hepatic or gastrointestinal causes. Gas-X ineffective. - Consider further imaging or evaluation if symptoms persist.

## 2023-10-22 LAB — HEPATITIS PANEL(REFL)
HEPATITIS C ANTIBODY REFILL$(REFL): NONREACTIVE
Hep B Core Total Ab: NONREACTIVE
Hep B S Ab: REACTIVE — AB
Hepatitis A AB,Total: REACTIVE — AB
Hepatitis B Surface Ag: NONREACTIVE

## 2023-10-22 LAB — REFLEX TIQ

## 2023-10-22 LAB — INSULIN, RANDOM: Insulin: 7.3 u[IU]/mL

## 2023-10-23 DIAGNOSIS — M6281 Muscle weakness (generalized): Secondary | ICD-10-CM | POA: Diagnosis not present

## 2023-10-23 DIAGNOSIS — M545 Low back pain, unspecified: Secondary | ICD-10-CM | POA: Diagnosis not present

## 2023-10-23 DIAGNOSIS — M2560 Stiffness of unspecified joint, not elsewhere classified: Secondary | ICD-10-CM | POA: Diagnosis not present

## 2023-10-23 DIAGNOSIS — M4143 Neuromuscular scoliosis, cervicothoracic region: Secondary | ICD-10-CM | POA: Diagnosis not present

## 2023-10-23 DIAGNOSIS — R293 Abnormal posture: Secondary | ICD-10-CM | POA: Diagnosis not present

## 2023-10-23 LAB — FERRITIN: Ferritin: 21 ng/mL (ref 10.0–291.0)

## 2023-10-23 LAB — VITAMIN B12: Vitamin B-12: 274 pg/mL (ref 211–911)

## 2023-10-24 ENCOUNTER — Ambulatory Visit: Payer: Self-pay | Admitting: Family Medicine

## 2023-10-26 ENCOUNTER — Ambulatory Visit: Admitting: Family Medicine

## 2023-10-26 ENCOUNTER — Encounter: Payer: Self-pay | Admitting: Family Medicine

## 2023-10-26 ENCOUNTER — Encounter: Payer: Self-pay | Admitting: Advanced Practice Midwife

## 2023-10-26 ENCOUNTER — Other Ambulatory Visit (HOSPITAL_BASED_OUTPATIENT_CLINIC_OR_DEPARTMENT_OTHER): Payer: Self-pay

## 2023-10-26 VITALS — BP 112/64 | HR 104 | Temp 98.2°F | Ht 62.0 in | Wt 131.0 lb

## 2023-10-26 DIAGNOSIS — E162 Hypoglycemia, unspecified: Secondary | ICD-10-CM | POA: Diagnosis not present

## 2023-10-26 DIAGNOSIS — F321 Major depressive disorder, single episode, moderate: Secondary | ICD-10-CM | POA: Diagnosis not present

## 2023-10-26 DIAGNOSIS — E538 Deficiency of other specified B group vitamins: Secondary | ICD-10-CM | POA: Diagnosis not present

## 2023-10-26 DIAGNOSIS — D508 Other iron deficiency anemias: Secondary | ICD-10-CM | POA: Diagnosis not present

## 2023-10-26 MED ORDER — CYANOCOBALAMIN 1000 MCG/ML IJ SOLN
1000.0000 ug | INTRAMUSCULAR | 3 refills | Status: DC
  Filled 2023-10-26: qty 1, 30d supply, fill #0
  Filled 2023-11-21: qty 1, 30d supply, fill #1
  Filled 2023-12-25: qty 1, 30d supply, fill #2
  Filled 2024-01-24: qty 1, 30d supply, fill #3

## 2023-10-26 MED ORDER — DULOXETINE HCL 20 MG PO CPEP
20.0000 mg | ORAL_CAPSULE | Freq: Every day | ORAL | 0 refills | Status: DC
  Filled 2023-10-26: qty 30, 30d supply, fill #0

## 2023-10-26 MED ORDER — CYANOCOBALAMIN 1000 MCG/ML IJ SOLN
1000.0000 ug | Freq: Once | INTRAMUSCULAR | Status: AC
  Administered 2023-10-26: 1000 ug via INTRAMUSCULAR

## 2023-10-26 NOTE — Progress Notes (Signed)
 Subjective:  Patient ID: Tricia Allen, female    DOB: 25-Jul-1995  Age: 28 y.o. MRN: [id number]  Chief Complaint  Patient presents with   Genesight Testing   hypogycemia   B12 Deficiency     Discussed the use of AI scribe software for clinical note transcription with the patient, who gave verbal consent to proceed.  History of Present Illness   Tricia Allen is a 28 year old female who presents with concerns about her mental health medication management and blood sugar fluctuations.   Patient presents today for [facility] testing results. States at the age of 43 she took lexapro, amitriptyline and abilify. She is uncertain about a formal diagnosis of bipolar disorder and has difficulty finding a psychiatrist who accepts her insurance. States she took the medications for approx 1 year and reports she felt like a zombie while on them. Lexapro initially worsened her symptoms, causing increased crying and suicidal thoughts, which improved after dosage adjustments and the addition of other medications. She is unsure about the sequence of her medication regimen. Once she stopped did not have any more issues. Since having her second child her anxiety has worsened. Her father has a history of anxiety as well but is untreated.   She experiences significant fluctuations in blood sugar levels, with episodes of both hypoglycemia and hyperglycemia. Her insulin  levels have been checked, and she notes that her iron  levels fluctuate, being high during menstruation and low at other times.  She is interested in resuming B12 injections, which she has received in the past. Her previous doctor administered these injections, but she is open to bringing the vials to the clinic for administration. Recent tests showed low B12 levels.  She is considering trying Cymbalta for her mental health concerns but is cautious about potential withdrawal symptoms and side effects. She has not tried Cymbalta before  and is interested in starting at a low dose.     Genesight Results as follows:  Antidepressants Use as Directed: Amitriptyline Desipramine Desvenlafaxin Doxepin Duloxetine Fluvoxamine Imipramine Nortriptyline Vortiozetine  Moderate Gene-Drug Interaction: Clomipramine Fluoxetine Levomilnacipran Mirtazapine Selegiline Trazodone Venlafaxine Vilazodone Parozetine Citalopram Escitalopram Sertraline  Significant GeneDrug Interaction: Bupropion   Anxiolytics and Hypnotics Use as Directed: Lorazepam Oxazepam Propranolol Temazepam  Moderate GeneDrug Interaction: Aiprazolam Buspirone Chlordiazepoxide Clonazepam Eszopiclone Zolpidem  Lemborexant Suvorexant  Significant GeneDrug Interaction: Diazepam     10/26/2023    8:08 AM 07/13/2023    8:40 AM 04/12/2023    3:48 PM 03/27/2022    9:26 AM  Depression screen PHQ 2/9  Decreased Interest 1 1 2  0  Down, Depressed, Hopeless 1 1 2  0  PHQ - 2 Score 2 2 4  0  Altered sleeping 3 0 1 0  Tired, decreased energy 3 1 2 2   Change in appetite 2 1 3  0  Feeling bad or failure about yourself  2 0 0 0  Trouble concentrating 1 1 3  0  Moving slowly or fidgety/restless 1 0 0 0  Suicidal thoughts 0 0 0 0  PHQ-9 Score 14 5 13 2   Difficult doing work/chores Not difficult at all  Very difficult         09/12/2023    2:33 PM  Fall Risk   Falls in the past year? 0  Number falls in past yr: 0  Injury with Fall? 0  Follow up Falls evaluation completed    Patient Care Team: Janece Means, FNP as PCP - General (Family Medicine) Kiki Pelton, MD as PCP -  OBGYN (Obstetrics and Gynecology) Tobb, Kardie, DO as PCP - Cardiology (Cardiology) Isidro Margo, DO as Consulting Physician (Family Medicine) Janece Means, FNP (Family Medicine) Jhonny Moss, MD as Consulting Physician (Neurology)   Review of Systems  Constitutional:  Negative for chills, fatigue and fever.  HENT:  Negative for congestion, ear pain,  rhinorrhea and sore throat.   Respiratory:  Negative for cough and shortness of breath.   Cardiovascular:  Negative for chest pain.  Gastrointestinal:  Negative for abdominal pain, constipation, diarrhea, nausea and vomiting.  Genitourinary:  Negative for dysuria and urgency.  Musculoskeletal:  Negative for back pain and myalgias.  Neurological:  Positive for dizziness and headaches. Negative for weakness and light-headedness.  Psychiatric/Behavioral:  Negative for dysphoric mood. The patient is not nervous/anxious.     Current Outpatient Medications on File Prior to Visit  Medication Sig Dispense Refill   magnesium gluconate (MAGONATE) 500 MG tablet Take 500 mg by mouth 2 (two) times daily.     VENTOLIN  HFA 108 (90 Base) MCG/ACT inhaler Inhale 2 puffs every 6 (six) hours as needed for wheezing. 18 g 0   No current facility-administered medications on file prior to visit.   Past Medical History:  Diagnosis Date   Anxiety    Fatigue due to depression 04/12/2023   Left ovarian cyst    Past Surgical History:  Procedure Laterality Date   TONSILLECTOMY AND ADENOIDECTOMY      Family History  Problem Relation Age of Onset   Healthy Mother    Asthma Father    Hypertension Father    Heart disease Paternal Grandmother    Cancer Paternal Grandmother    Diabetes Paternal Grandfather    Social History   Socioeconomic History   Marital status: Single    Spouse name: Not on file   Number of children: Not on file   Years of education: Not on file   Highest education level: 12th grade  Occupational History   Not on file  Tobacco Use   Smoking status: Never   Smokeless tobacco: Never  Vaping Use   Vaping status: Never Used  Substance and Sexual Activity   Alcohol use: Not Currently   Drug use: Never   Sexual activity: Not Currently    Birth control/protection: None  Other Topics Concern   Not on file  Social History Narrative   ** Merged History Encounter **    Right hand    Dental assistance   Lives with boyfriend and two kids   Caffeine  One soda a day   Social Drivers of Health   Financial Resource Strain: Low Risk  (06/22/2023)   Overall Financial Resource Strain (CARDIA)    Difficulty of Paying Living Expenses: Not hard at all  Food Insecurity: No Food Insecurity (06/22/2023)   Hunger Vital Sign    Worried About Running Out of Food in the Last Year: Never true    Ran Out of Food in the Last Year: Never true  Transportation Needs: No Transportation Needs (06/22/2023)   PRAPARE - Administrator, Civil Service (Medical): No    Lack of Transportation (Non-Medical): No  Physical Activity: Inactive (06/22/2023)   Exercise Vital Sign    Days of Exercise per Week: 0 days    Minutes of Exercise per Session: 0 min  Stress: Stress Concern Present (06/22/2023)   [employer] of Occupational Health - Occupational Stress Questionnaire    Feeling of Stress : Rather much  Social Connections: Moderately  Isolated (06/22/2023)   Social Connection and Isolation Panel [NHANES]    Frequency of Communication with Friends and Family: More than three times a week    Frequency of Social Gatherings with Friends and Family: Three times a week    Attends Religious Services: Never    Active Member of Clubs or Organizations: No    Attends Banker Meetings: Never    Marital Status: Living with partner    Objective:  BP 112/64   Pulse (!) 104   Temp 98.2 F (36.8 C)   Ht 5\' 2"  (1.575 m)   Wt 131 lb (59.4 kg)   LMP 09/27/2023 (Exact Date)   SpO2 100%   BMI 23.96 kg/m      10/26/2023    8:02 AM 10/19/2023    2:03 PM 10/19/2023    8:05 AM  BP/Weight  Systolic BP 112 108 100  Diastolic BP 64 64 62  Wt. (Lbs) 131  130.8  BMI 23.96 kg/m2  23.92 kg/m2    Physical Exam Vitals reviewed.  Constitutional:      General: She is not in acute distress.    Appearance: Normal appearance.  HENT:     Left Ear: There is impacted cerumen.  Eyes:      Conjunctiva/sclera: Conjunctivae normal.  Cardiovascular:     Rate and Rhythm: Normal rate and regular rhythm.     Heart sounds: Normal heart sounds. No murmur heard. Pulmonary:     Effort: Pulmonary effort is normal.     Breath sounds: Normal breath sounds. No wheezing.  Abdominal:     General: Bowel sounds are normal.     Palpations: Abdomen is soft.     Tenderness: There is no abdominal tenderness.  Musculoskeletal:        General: Normal range of motion.  Skin:    Findings: Bruising (BLE - improved) present.  Neurological:     Mental Status: She is alert. Mental status is at baseline.  Psychiatric:        Mood and Affect: Mood normal.        Behavior: Behavior normal.     Lab Results  Component Value Date   WBC 5.7 10/18/2023   HGB 12.6 10/18/2023   HCT 36.8 10/18/2023   PLT 251 10/18/2023   GLUCOSE 81 10/19/2023   ALT 10 10/19/2023   AST 14 10/19/2023   NA 137 10/19/2023   K 3.9 10/19/2023   CL 102 10/19/2023   CREATININE 0.77 10/19/2023   BUN 11 10/19/2023   CO2 27 10/19/2023   TSH 1.34 04/16/2023   INR 1.0 09/14/2023   HGBA1C 5.1 06/22/2023      Assessment & Plan:  Hypoglycemia Assessment & Plan: She experiences significant blood sugar fluctuations without diabetes diagnosis. Discussed CGM use for monitoring. - Provide two Libre sensors for 28-day continuous glucose monitoring. - Instruct on Libre sensor use for blood glucose monitoring.   B12 deficiency Assessment & Plan: Low B12 levels with interest in resuming injections. Plan for biweekly injections initially, then adjust based on response. - Send prescription for B12 vials to pharmacy. - Administer B12 injections biweekly for first two doses, then adjust frequency based on response. - Schedule nurse visits for B12 injections, preferably on Fridays.   Orders: -     Cyanocobalamin ; Inject 1 mL (1,000 mcg total) into the muscle every 30 (thirty) days for 4 doses.  Dispense: 1 mL; Refill: 3 -      Cyanocobalamin   Other iron  deficiency  anemia Assessment & Plan: Significant fluctuation in iron  levels with low ferritin. Possible dietary influences on iron  metabolism or menstrual cycle. - Advised continuation of iron  supplements. - Continue iron  supplementation as previously advised. - Consider hematology referral for iron  metabolism evaluation if needed.   Moderate major depression (HCC) Assessment & Plan: She expressed concern about bipolar disorder diagnosis and potential adverse effects of antidepressants. Discussed medication options and the importance of psychiatric consultation. - Genesight testing results helpful in deciding right medication yet patient would still like to be seen by a specialist due to her past history of SI - Prescribe Cymbalta 20 mg daily and monitor response. - Advise contacting insurance for psychiatrists accepting her plan. - Advise bringing genetic testing results to psychiatric appointments.  Orders: -     Ambulatory referral to Psychiatry -     DULoxetine HCl; Take 1 capsule (20 mg total) by mouth daily.  Dispense: 30 capsule; Refill: 0      Meds ordered this encounter  Medications   DULoxetine (CYMBALTA) 20 MG capsule    Sig: Take 1 capsule (20 mg total) by mouth daily.    Dispense:  30 capsule    Refill:  0   cyanocobalamin  (VITAMIN B12) 1000 MCG/ML injection    Sig: Inject 1 mL (1,000 mcg total) into the muscle every 30 (thirty) days for 4 doses.    Dispense:  1 mL    Refill:  3   cyanocobalamin  (VITAMIN B12) injection 1,000 mcg    Orders Placed This Encounter  Procedures   Ambulatory referral to Psychiatry     Follow-up: Return in about 1 week (around 11/02/2023) for nurse visit - B12 injection.   An After Visit Summary was printed and given to the patient.  Delford Felling, FNP [facility] [phone]

## 2023-10-27 DIAGNOSIS — E538 Deficiency of other specified B group vitamins: Secondary | ICD-10-CM | POA: Insufficient documentation

## 2023-10-27 DIAGNOSIS — E162 Hypoglycemia, unspecified: Secondary | ICD-10-CM | POA: Insufficient documentation

## 2023-10-27 NOTE — Assessment & Plan Note (Signed)
 Significant fluctuation in iron  levels with low ferritin. Possible dietary influences on iron  metabolism or menstrual cycle. - Advised continuation of iron  supplements. - Continue iron  supplementation as previously advised. - Consider hematology referral for iron  metabolism evaluation if needed.

## 2023-10-27 NOTE — Assessment & Plan Note (Signed)
 She expressed concern about bipolar disorder diagnosis and potential adverse effects of antidepressants. Discussed medication options and the importance of psychiatric consultation. - Genesight testing results helpful in deciding right medication yet patient would still like to be seen by a specialist due to her past history of SI - Prescribe Cymbalta 20 mg daily and monitor response. - Advise contacting insurance for psychiatrists accepting her plan. - Advise bringing genetic testing results to psychiatric appointments.

## 2023-10-27 NOTE — Assessment & Plan Note (Signed)
 She experiences significant blood sugar fluctuations without diabetes diagnosis. Discussed CGM use for monitoring. - Provide two Libre sensors for 28-day continuous glucose monitoring. - Instruct on Libre sensor use for blood glucose monitoring.

## 2023-10-27 NOTE — Assessment & Plan Note (Signed)
 Low B12 levels with interest in resuming injections. Plan for biweekly injections initially, then adjust based on response. - Send prescription for B12 vials to pharmacy. - Administer B12 injections biweekly for first two doses, then adjust frequency based on response. - Schedule nurse visits for B12 injections, preferably on Fridays.

## 2023-10-31 ENCOUNTER — Encounter: Payer: Self-pay | Admitting: Family Medicine

## 2023-11-01 ENCOUNTER — Ambulatory Visit: Admitting: Family Medicine

## 2023-11-01 VITALS — BP 108/65 | HR 88 | Ht 62.0 in | Wt 132.0 lb

## 2023-11-01 DIAGNOSIS — O209 Hemorrhage in early pregnancy, unspecified: Secondary | ICD-10-CM

## 2023-11-01 NOTE — Progress Notes (Signed)
   Subjective:    Patient ID: Tricia Allen, female    DOB: 1995-08-15, 28 y.o.   MRN: [id number]  HPI  Patient seen - early pregnant, seven days late for her last menses. Had positive pregnancy test at home. Did have some light bleeding a couple of days ago. Is having some significant cramping. This is an unplanned pregnancy. She is unsure of whether she wants to continue the pregnancy.   Review of Systems     Objective:   Physical Exam Vitals reviewed.  Constitutional:      Appearance: Normal appearance.  Skin:    Capillary Refill: Capillary refill takes less than 2 seconds.  Neurological:     General: No focal deficit present.     Mental Status: She is alert.  Psychiatric:        Mood and Affect: Mood normal.        Behavior: Behavior normal.        Thought Content: Thought content normal.        Judgment: Judgment normal.       Assessment & Plan:  1. Bleeding in early pregnancy (Primary) Check HCG. Discussed options of medication abortion and local providers. Discussed process of medication abortion, things to watch out for. If she proceeds, would be willing to prescribe pain medicine and nausea medication. Will call with results of HCG. - Beta hCG quant (ref lab)  15 min spent in above counseling

## 2023-11-01 NOTE — Patient Instructions (Addendum)
 [facility] Address: [address], [place], [place] [address] Phone: [phone]  [facility] Address: [address] [place]. [address] [place], [place] [address] Phone: [phone]

## 2023-11-02 ENCOUNTER — Ambulatory Visit: Payer: Self-pay | Admitting: Family Medicine

## 2023-11-02 LAB — BETA HCG QUANT (REF LAB): hCG Quant: 5182 m[IU]/mL

## 2023-11-04 DIAGNOSIS — O26891 Other specified pregnancy related conditions, first trimester: Secondary | ICD-10-CM | POA: Diagnosis not present

## 2023-11-04 DIAGNOSIS — R42 Dizziness and giddiness: Secondary | ICD-10-CM | POA: Diagnosis not present

## 2023-11-04 DIAGNOSIS — R109 Unspecified abdominal pain: Secondary | ICD-10-CM | POA: Diagnosis not present

## 2023-11-04 DIAGNOSIS — O26899 Other specified pregnancy related conditions, unspecified trimester: Secondary | ICD-10-CM | POA: Diagnosis not present

## 2023-11-04 DIAGNOSIS — Z3A01 Less than 8 weeks gestation of pregnancy: Secondary | ICD-10-CM | POA: Diagnosis not present

## 2023-11-04 DIAGNOSIS — R1030 Lower abdominal pain, unspecified: Secondary | ICD-10-CM | POA: Diagnosis not present

## 2023-11-12 ENCOUNTER — Encounter: Payer: Self-pay | Admitting: Family Medicine

## 2023-11-12 ENCOUNTER — Encounter (HOSPITAL_BASED_OUTPATIENT_CLINIC_OR_DEPARTMENT_OTHER): Payer: Self-pay

## 2023-11-12 ENCOUNTER — Other Ambulatory Visit: Payer: Self-pay | Admitting: Obstetrics and Gynecology

## 2023-11-12 ENCOUNTER — Other Ambulatory Visit (HOSPITAL_BASED_OUTPATIENT_CLINIC_OR_DEPARTMENT_OTHER): Payer: Self-pay

## 2023-11-12 DIAGNOSIS — O219 Vomiting of pregnancy, unspecified: Secondary | ICD-10-CM

## 2023-11-12 MED ORDER — DOXYLAMINE-PYRIDOXINE 10-10 MG PO TBEC
2.0000 | DELAYED_RELEASE_TABLET | Freq: Every day | ORAL | 3 refills | Status: DC
  Filled 2023-11-12: qty 60, 30d supply, fill #0

## 2023-11-13 ENCOUNTER — Other Ambulatory Visit (HOSPITAL_BASED_OUTPATIENT_CLINIC_OR_DEPARTMENT_OTHER): Payer: Self-pay

## 2023-11-13 ENCOUNTER — Encounter: Payer: Self-pay | Admitting: Advanced Practice Midwife

## 2023-11-13 DIAGNOSIS — J3489 Other specified disorders of nose and nasal sinuses: Secondary | ICD-10-CM | POA: Diagnosis not present

## 2023-11-13 DIAGNOSIS — R0981 Nasal congestion: Secondary | ICD-10-CM | POA: Diagnosis not present

## 2023-11-13 DIAGNOSIS — R051 Acute cough: Secondary | ICD-10-CM | POA: Diagnosis not present

## 2023-11-13 DIAGNOSIS — J029 Acute pharyngitis, unspecified: Secondary | ICD-10-CM | POA: Diagnosis not present

## 2023-11-13 MED ORDER — IBUPROFEN 600 MG PO TABS
600.0000 mg | ORAL_TABLET | Freq: Four times a day (QID) | ORAL | 0 refills | Status: DC | PRN
  Filled 2023-11-13: qty 90, 23d supply, fill #0

## 2023-11-13 MED ORDER — PROMETHAZINE-DM 6.25-15 MG/5ML PO SYRP
5.0000 mL | ORAL_SOLUTION | ORAL | 0 refills | Status: DC | PRN
  Filled 2023-11-13: qty 120, 4d supply, fill #0

## 2023-11-13 MED ORDER — IPRATROPIUM BROMIDE 0.03 % NA SOLN
2.0000 | Freq: Three times a day (TID) | NASAL | 0 refills | Status: DC
  Filled 2023-11-13: qty 30, 28d supply, fill #0

## 2023-11-14 ENCOUNTER — Other Ambulatory Visit (HOSPITAL_BASED_OUTPATIENT_CLINIC_OR_DEPARTMENT_OTHER): Payer: Self-pay

## 2023-11-15 DIAGNOSIS — Z419 Encounter for procedure for purposes other than remedying health state, unspecified: Secondary | ICD-10-CM | POA: Diagnosis not present

## 2023-11-21 ENCOUNTER — Encounter (HOSPITAL_BASED_OUTPATIENT_CLINIC_OR_DEPARTMENT_OTHER): Payer: Self-pay

## 2023-11-21 ENCOUNTER — Other Ambulatory Visit (HOSPITAL_BASED_OUTPATIENT_CLINIC_OR_DEPARTMENT_OTHER): Payer: Self-pay

## 2023-11-21 ENCOUNTER — Ambulatory Visit (HOSPITAL_BASED_OUTPATIENT_CLINIC_OR_DEPARTMENT_OTHER)
Admission: RE | Admit: 2023-11-21 | Discharge: 2023-11-21 | Disposition: A | Discharge status: Home / Self Care | Source: Ambulatory Visit | Attending: Family Medicine | Admitting: Family Medicine

## 2023-11-21 VITALS — BP 100/70 | HR 95 | Temp 99.0°F | Resp 18

## 2023-11-21 DIAGNOSIS — J0140 Acute pansinusitis, unspecified: Secondary | ICD-10-CM

## 2023-11-21 DIAGNOSIS — Z3A01 Less than 8 weeks gestation of pregnancy: Secondary | ICD-10-CM

## 2023-11-21 DIAGNOSIS — R519 Headache, unspecified: Secondary | ICD-10-CM

## 2023-11-21 MED ORDER — CEPHALEXIN 500 MG PO CAPS
500.0000 mg | ORAL_CAPSULE | Freq: Three times a day (TID) | ORAL | 0 refills | Status: DC
  Filled 2023-11-21: qty 21, 7d supply, fill #0

## 2023-11-21 MED ORDER — IPRATROPIUM BROMIDE 0.03 % NA SOLN: NASAL | 0 refills | Status: DC

## 2023-11-21 MED ORDER — CEPHALEXIN 500 MG PO CAPS: 500.0000 mg | ORAL_CAPSULE | Freq: Three times a day (TID) | ORAL | 0 refills | Status: AC

## 2023-11-21 MED ORDER — IPRATROPIUM BROMIDE 0.03 % NA SOLN
NASAL | 0 refills | Status: DC
  Filled 2023-11-21: qty 30, fill #0

## 2023-11-21 NOTE — Discharge Instructions (Addendum)
 Patient is 7 weeks and 6 days pregnant.  She has symptoms that could be consistent with a sinus infection.  But she does not have a fever.  Encouraged to use Rhinocort AQ nasal spray, 2 sprays into each nostril once or twice daily as needed for congestion.  Encouraged to do saline rinses twice daily.  Did provide cephalexin , 500 mg, 1 pill 3 times daily for 7 days.  Encouraged to hold off on the cephalexin  and only use it if her symptoms get worse or she has a persistent fever.  Due to pregnancy she should only use acetaminophen  OTC, as needed for headache.  Follow-up with primary care as needed.  Return here if needed.

## 2023-11-21 NOTE — ED Triage Notes (Signed)
 Pt started getting sick last Monday, she had a headache and sinus pressure, chills, and cough.

## 2023-11-21 NOTE — ED Provider Notes (Addendum)
 [facility]    CSN: [id number] Arrival date & time: 11/21/23  1718      History   Chief Complaint Chief Complaint  Patient presents with   Headache    Sinus pressure - Entered by patient    HPI Tricia Allen is a 28 y.o. female.   Patient is 7 weeks and 6 days pregnant.  She has not yet had a ultrasound to confirm intrauterine pregnancy.  She denies any bleeding or spotting.  She has some intermittent nausea and has actually been provided Diclegis  to help with the nausea.  She reports that she has had symptoms since 11/17/2023.  She has runny nose, congestion, facial pressure and pain, right frontal and occipital headache.  She feels like she has a significant sinus infection.   Headache Associated symptoms: congestion, drainage, fever and sinus pressure   Associated symptoms: no abdominal pain, no back pain, no cough, no diarrhea, no ear pain, no eye pain, no nausea, no seizures, no sore throat and no vomiting     Past Medical History:  Diagnosis Date   Anxiety    Fatigue due to depression 04/12/2023   Left ovarian cyst     Patient Active Problem List   Diagnosis Date Noted   B12 deficiency 10/27/2023   Hypoglycemia 10/27/2023   Elevated serum glucose 10/19/2023   Right upper quadrant pain 10/19/2023   Abnormal bruising 09/14/2023   Rash 08/23/2023   Acute non-recurrent frontal sinusitis 08/16/2023   Pharyngitis 08/16/2023   Flu-like symptoms 08/16/2023   Urinary tract infection without hematuria 07/14/2023   Urinary incontinence without sensory awareness 07/14/2023   Elevated blood sugar 06/22/2023   Vitamin D  deficiency 04/20/2023   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   Dizziness 04/16/2023   Numbness and tingling of right leg 04/12/2023   Acute pain of left knee 04/12/2023   Myalgia 04/12/2023   Fatigue due to depression 04/12/2023   GAD (generalized anxiety disorder) 04/12/2023   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 Authorizing Provider  Doxylamine -Pyridoxine  (DICLEGIS ) 10-10 MG TBEC Take 2 tablets by mouth at bedtime. If symptoms persist, add one tablet in the morning and one in the afternoon 11/12/23   Alto Atta, Jodelle Mungo, MD  cephALEXin  (KEFLEX ) 500 MG capsule Take 1 capsule (500 mg total) by mouth 3 (three) times daily for 7 days. 11/21/23 11/28/23  Guss Legacy, FNP  cyanocobalamin  (VITAMIN B12) 1000 MCG/ML injection Inject 1 mL (1,000 mcg total) into the muscle every 30 (thirty) days for 4 doses. 10/26/23 01/25/24  Janece Means, FNP  ipratropium (ATROVENT ) 0.03 % nasal spray 2  sprays into each nostril twice daily for nasal congestion 11/21/23   Guss Legacy, FNP  magnesium gluconate (MAGONATE) 500 MG tablet Take 500 mg by mouth 2 (two) times daily.    [provider]  VENTOLIN  HFA 108 (90 Base) MCG/ACT inhaler Inhale 2 puffs every 6 (six) hours as needed for wheezing. 08/14/23       Family History Family History  Problem Relation Age of Onset   Healthy Mother    Asthma Father    Hypertension Father    Heart disease Paternal Grandmother    Cancer Paternal Grandmother    Diabetes Paternal Grandfather     Social History Social History   Tobacco Use   Smoking status: Never   Smokeless tobacco: Never  Vaping Use   Vaping status: Never Used  Substance Use Topics   Alcohol use:  Not Currently   Drug use: Never     Allergies   Codeine and Codeine   Review of Systems Review of Systems  Constitutional:  Positive for fever. Negative for chills.  HENT:  Positive for congestion, postnasal drip, rhinorrhea, sinus pressure and sinus pain. Negative for ear pain and sore throat.   Eyes:  Negative for pain and visual disturbance.  Respiratory:  Negative for cough and shortness of breath.   Cardiovascular:  Negative for chest pain and palpitations.  Gastrointestinal:  Negative for abdominal pain, constipation, diarrhea, nausea and vomiting.  Genitourinary:  Negative for dysuria and hematuria.  Musculoskeletal:  Negative for arthralgias and back pain.  Skin:  Negative for color change and rash.  Neurological:  Positive for headaches. Negative for seizures and syncope.  All other systems reviewed and are negative.    Physical Exam Triage Vital Signs ED Triage Vitals  Encounter Vitals Group     BP 11/21/23 1725 100/70     Girls Systolic BP Percentile --      Girls Diastolic BP Percentile --      Boys Systolic BP Percentile --      Boys Diastolic BP Percentile --      Pulse Rate 11/21/23 1725 95     Resp 11/21/23 1725 18     Temp 11/21/23 1725 99 F (37.2 C)     Temp Source 11/21/23 1725 Oral     SpO2 11/21/23 1725 97 %     Weight --      Height --      Head Circumference --      Peak Flow --      Pain Score 11/21/23 1724 10     Pain Loc --      Pain Education --      Exclude from Growth Chart --    No data found.  Updated Vital Signs BP 100/70 (BP Location: Right Arm)   Pulse 95   Temp 99 F (37.2 C) (Oral)   Resp 18   LMP 09/27/2023   SpO2 97%   Visual Acuity Right Eye Distance:   Left Eye Distance:   Bilateral Distance:    Right Eye Near:   Left Eye Near:    Bilateral Near:     Physical Exam Vitals and nursing note reviewed.  Constitutional:      General: She is not in acute distress.    Appearance: She is well-developed. She is  not ill-appearing or toxic-appearing.  HENT:     Head: Normocephalic and atraumatic.     Right Ear: Hearing, tympanic membrane, ear canal and external ear normal.  Left Ear: Hearing, tympanic membrane, ear canal and external ear normal.     Nose: Mucosal edema and congestion present.     Right Sinus: Maxillary sinus tenderness and frontal sinus tenderness present.     Left Sinus: No maxillary sinus tenderness or frontal sinus tenderness.     Mouth/Throat:     Lips: Pink.     Mouth: Mucous membranes are moist.     Pharynx: Uvula midline. No oropharyngeal exudate or posterior oropharyngeal erythema.     Tonsils: No tonsillar exudate.   Eyes:     Conjunctiva/sclera: Conjunctivae normal.     Pupils: Pupils are equal, round, and reactive to light.    Cardiovascular:     Rate and Rhythm: Normal rate and regular rhythm.     Heart sounds: S1 normal and S2 normal. No murmur heard. Pulmonary:     Effort: Pulmonary effort is normal. No respiratory distress.     Breath sounds: Normal breath sounds. No decreased breath sounds, wheezing, rhonchi or rales.  Abdominal:     General: Bowel sounds are normal.     Palpations: Abdomen is soft.     Tenderness: There is no abdominal tenderness.   Musculoskeletal:        General: No swelling.     Cervical back: Neck supple.  Lymphadenopathy:     Head:     Right side of head: No submental, submandibular, tonsillar, preauricular or posterior auricular adenopathy.     Left side of head: No submental, submandibular, tonsillar, preauricular or posterior auricular adenopathy.     Cervical: No cervical adenopathy.     Right cervical: No superficial cervical adenopathy.    Left cervical: No superficial cervical adenopathy.   Skin:    General: Skin is warm and dry.     Capillary Refill: Capillary refill takes less than 2 seconds.     Findings: No rash.   Neurological:     Mental Status: She is alert and oriented to person, place, and time.    Psychiatric:        Mood and Affect: Mood normal.      UC Treatments / Results  Labs (all labs ordered are listed, but only abnormal results are displayed) Labs Reviewed - No data to display  EKG   Radiology No results found.  Procedures Procedures (including critical care time)  Medications Ordered in UC Medications - No data to display  Initial Impression / Assessment and Plan / UC Course  I have reviewed the triage vital signs and the nursing notes.  Pertinent labs & imaging results that were available during my care of the patient were reviewed by me and considered in my medical decision making (see chart for details).  Plan of Care: Sinus headache and sinus infection: Encouraged sinus rinses with saline.  Rhinocort AQ, 1 to 2 sprays into each nostril once or twice daily as needed for nasal congestion.  Encouraged to try to see if this can resolve without antibiotics.  Provided cephalexin  500 mg 1 pill 3 times a day for 7 days if needed for the sinus infection.  Follow-up with primary care or return here as needed.  Follow-up with obstetrics as planned.  I reviewed the plan of care with the patient and/or the patient's guardian.  The patient and/or guardian had time to ask questions and acknowledged that the questions were answered.  I provided instruction on symptoms or reasons to return here or to go to an ER, if symptoms/condition did not improve,  worsened or if new symptoms occurred.  Final Clinical Impressions(s) / UC Diagnoses   Final diagnoses:  Acute non-recurrent pansinusitis  Sinus headache  Less than [redacted] weeks gestation of pregnancy     Discharge Instructions      Patient is 7 weeks and 6 days pregnant.  She has symptoms that could be consistent with a sinus infection.  But she does not have a fever.  Encouraged to use Rhinocort AQ nasal spray, 2 sprays into each nostril once or twice daily as needed for congestion.  Encouraged to do saline rinses twice  daily.  Did provide cephalexin , 500 mg, 1 pill 3 times daily for 7 days.  Encouraged to hold off on the cephalexin  and only use it if her symptoms get worse or she has a persistent fever.  Due to pregnancy she should only use acetaminophen  OTC, as needed for headache.  Follow-up with primary care as needed.  Return here if needed.     ED Prescriptions     Medication Sig Dispense Auth. Provider   ipratropium (ATROVENT ) 0.03 % nasal spray  (Status: Discontinued) 2 sprays into each nostril twice daily for nasal congestion 30 mL Guss Legacy, FNP   cephALEXin  (KEFLEX ) 500 MG capsule  (Status: Discontinued) Take 1 capsule (500 mg total) by mouth 3 (three) times daily for 7 days. 21 capsule Guss Legacy, FNP   ipratropium (ATROVENT ) 0.03 % nasal spray 2 sprays into each nostril twice daily for nasal congestion 30 mL Guss Legacy, FNP   cephALEXin  (KEFLEX ) 500 MG capsule Take 1 capsule (500 mg total) by mouth 3 (three) times daily for 7 days. 21 capsule Guss Legacy, FNP      PDMP not reviewed this encounter.   Guss Legacy, FNP 11/21/23 Romona Cobb    Guss Legacy, FNP 11/21/23 [phone]

## 2023-11-22 ENCOUNTER — Other Ambulatory Visit (HOSPITAL_BASED_OUTPATIENT_CLINIC_OR_DEPARTMENT_OTHER): Payer: Self-pay

## 2023-11-22 DIAGNOSIS — R519 Headache, unspecified: Secondary | ICD-10-CM | POA: Diagnosis not present

## 2023-11-22 DIAGNOSIS — H5213 Myopia, bilateral: Secondary | ICD-10-CM | POA: Diagnosis not present

## 2023-11-22 DIAGNOSIS — J00 Acute nasopharyngitis [common cold]: Secondary | ICD-10-CM | POA: Diagnosis not present

## 2023-11-22 DIAGNOSIS — B974 Respiratory syncytial virus as the cause of diseases classified elsewhere: Secondary | ICD-10-CM | POA: Diagnosis not present

## 2023-11-23 ENCOUNTER — Ambulatory Visit

## 2023-11-27 ENCOUNTER — Ambulatory Visit

## 2023-11-27 ENCOUNTER — Other Ambulatory Visit: Payer: Self-pay

## 2023-11-27 DIAGNOSIS — O0990 Supervision of high risk pregnancy, unspecified, unspecified trimester: Secondary | ICD-10-CM | POA: Insufficient documentation

## 2023-11-27 DIAGNOSIS — Z3A08 8 weeks gestation of pregnancy: Secondary | ICD-10-CM

## 2023-11-27 DIAGNOSIS — O0991 Supervision of high risk pregnancy, unspecified, first trimester: Secondary | ICD-10-CM | POA: Diagnosis not present

## 2023-11-27 NOTE — Progress Notes (Signed)
 New OB Intake  I explained I am completing New OB Intake today. We discussed EDD of 07/03/2024, by Last Menstrual Period. Pt is G3P2002. I reviewed her allergies, medications and Medical/Surgical/OB history.    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   Dizziness 04/16/2023   Numbness and tingling of right leg 04/12/2023   Acute pain of left knee 04/12/2023   Myalgia 04/12/2023   Fatigue due to depression 04/12/2023   GAD (generalized anxiety disorder) 04/12/2023   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  Patient also informed that the ultrasound is not being completed with the intent of assessing for fetal or placental anomalies or any pelvic abnormalities. Explained that the purpose of today's ultrasound is to assess for viability.  Patient acknowledges the purpose of the exam and the limitations of the study.     Delivery Plans Plans to deliver at [facility]. Discussed the nature of our  practice with multiple providers including residents and students. Due to the size of the practice, the delivering provider may not be the same as those providing prenatal care.   MyChart/Babyscripts MyChart access verified. I explained pt will have some visits in office and some virtually. Babyscripts app discussed and ordered.   Blood Pressure Cuff Blood pressure cuff offered, patient has one from last delivery in 2024.  Discussed to be used for virtual visits and or if needed BP checks weekly.  Anatomy US  Explained first scheduled US  will be around 19 weeks.   Last Pap Diagnosis  Date Value Ref Range Status  08/09/2023   Final   - Negative for intraepithelial lesion or malignancy (NILM)    First visit review I reviewed new OB appt with patient. Explained pt will be seen by Dr. Erik at first visit. Discussed [place] genetic screening with patient and significant other. Routine prenatal labs ordered.    Erminio DELENA Rumps, [place] 11/27/2023  1:36 PM

## 2023-11-28 ENCOUNTER — Ambulatory Visit: Payer: Self-pay | Admitting: Family Medicine

## 2023-11-28 LAB — CBC/D/PLT+RPR+RH+ABO+RUBIGG...
Antibody Screen: NEGATIVE
Basophils Absolute: 0 10*3/uL (ref 0.0–0.2)
Basos: 0 %
EOS (ABSOLUTE): 0 10*3/uL (ref 0.0–0.4)
Eos: 0 %
HCV Ab: NONREACTIVE
HIV Screen 4th Generation wRfx: NONREACTIVE
Hematocrit: 37.3 % (ref 34.0–46.6)
Hemoglobin: 12.4 g/dL (ref 11.1–15.9)
Hepatitis B Surface Ag: NEGATIVE
Immature Grans (Abs): 0 10*3/uL (ref 0.0–0.1)
Immature Granulocytes: 0 %
Lymphocytes Absolute: 2.1 10*3/uL (ref 0.7–3.1)
Lymphs: 24 %
MCH: 31.5 pg (ref 26.6–33.0)
MCHC: 33.2 g/dL (ref 31.5–35.7)
MCV: 95 fL (ref 79–97)
Monocytes Absolute: 0.4 10*3/uL (ref 0.1–0.9)
Monocytes: 5 %
Neutrophils Absolute: 6.2 10*3/uL (ref 1.4–7.0)
Neutrophils: 71 %
Platelets: 351 10*3/uL (ref 150–450)
RBC: 3.94 x10E6/uL (ref 3.77–5.28)
RDW: 11.6 % — ABNORMAL LOW (ref 11.7–15.4)
RPR Ser Ql: NONREACTIVE
Rh Factor: POSITIVE
Rubella Antibodies, IGG: 7.19 {index} (ref >0.99)
WBC: 8.8 10*3/uL (ref 3.4–10.8)

## 2023-11-28 LAB — HCV INTERPRETATION

## 2023-11-29 LAB — CULTURE, OB URINE

## 2023-11-29 LAB — URINE CULTURE, OB REFLEX: Organism ID, Bacteria: NO GROWTH

## 2023-11-30 ENCOUNTER — Ambulatory Visit: Admitting: Family Medicine

## 2023-12-04 ENCOUNTER — Ambulatory Visit (INDEPENDENT_AMBULATORY_CARE_PROVIDER_SITE_OTHER)

## 2023-12-04 ENCOUNTER — Other Ambulatory Visit: Payer: Self-pay

## 2023-12-04 DIAGNOSIS — Z3A10 10 weeks gestation of pregnancy: Secondary | ICD-10-CM

## 2023-12-04 DIAGNOSIS — Z3687 Encounter for antenatal screening for uncertain dates: Secondary | ICD-10-CM

## 2023-12-04 DIAGNOSIS — O3680X0 Pregnancy with inconclusive fetal viability, not applicable or unspecified: Secondary | ICD-10-CM

## 2023-12-04 DIAGNOSIS — O0990 Supervision of high risk pregnancy, unspecified, unspecified trimester: Secondary | ICD-10-CM

## 2023-12-06 ENCOUNTER — Encounter: Payer: Self-pay | Admitting: Family Medicine

## 2023-12-11 ENCOUNTER — Encounter: Payer: Self-pay | Admitting: Family Medicine

## 2023-12-11 DIAGNOSIS — Z20828 Contact with and (suspected) exposure to other viral communicable diseases: Secondary | ICD-10-CM

## 2023-12-13 ENCOUNTER — Ambulatory Visit: Payer: Self-pay | Admitting: Family Medicine

## 2023-12-13 LAB — PARVOVIRUS B19 ANTIBODY, IGG AND IGM
Parvovirus B19 IgG: 6.7 {index} — ABNORMAL HIGH (ref 0.0–0.8)
Parvovirus B19 IgM: 0.7 {index} (ref 0.0–0.8)

## 2023-12-14 DIAGNOSIS — F331 Major depressive disorder, recurrent, moderate: Secondary | ICD-10-CM | POA: Diagnosis not present

## 2023-12-15 DIAGNOSIS — F331 Major depressive disorder, recurrent, moderate: Secondary | ICD-10-CM | POA: Diagnosis not present

## 2023-12-15 DIAGNOSIS — Z419 Encounter for procedure for purposes other than remedying health state, unspecified: Secondary | ICD-10-CM | POA: Diagnosis not present

## 2023-12-17 ENCOUNTER — Ambulatory Visit: Admitting: Obstetrics and Gynecology

## 2023-12-17 ENCOUNTER — Other Ambulatory Visit (HOSPITAL_BASED_OUTPATIENT_CLINIC_OR_DEPARTMENT_OTHER): Payer: Self-pay

## 2023-12-17 VITALS — BP 105/69 | HR 88 | Wt 136.0 lb

## 2023-12-17 DIAGNOSIS — F321 Major depressive disorder, single episode, moderate: Secondary | ICD-10-CM | POA: Diagnosis not present

## 2023-12-17 DIAGNOSIS — Z8759 Personal history of other complications of pregnancy, childbirth and the puerperium: Secondary | ICD-10-CM

## 2023-12-17 DIAGNOSIS — Z3A11 11 weeks gestation of pregnancy: Secondary | ICD-10-CM

## 2023-12-17 DIAGNOSIS — E538 Deficiency of other specified B group vitamins: Secondary | ICD-10-CM | POA: Diagnosis not present

## 2023-12-17 DIAGNOSIS — F411 Generalized anxiety disorder: Secondary | ICD-10-CM | POA: Diagnosis not present

## 2023-12-17 DIAGNOSIS — Q9359 Other deletions of part of a chromosome: Secondary | ICD-10-CM

## 2023-12-17 DIAGNOSIS — Z3481 Encounter for supervision of other normal pregnancy, first trimester: Secondary | ICD-10-CM | POA: Diagnosis not present

## 2023-12-17 DIAGNOSIS — O0990 Supervision of high risk pregnancy, unspecified, unspecified trimester: Secondary | ICD-10-CM

## 2023-12-17 MED ORDER — METOCLOPRAMIDE HCL 10 MG PO TABS
10.0000 mg | ORAL_TABLET | Freq: Four times a day (QID) | ORAL | 2 refills | Status: DC | PRN
  Filled 2023-12-17: qty 30, 8d supply, fill #0

## 2023-12-17 NOTE — Patient Instructions (Signed)
 SABRA

## 2023-12-17 NOTE — Progress Notes (Signed)
 Subjective:   Tricia Allen is a 28 y.o. G3P2002 at [redacted]w[redacted]d by LMP c/w 9wk US  being seen today for her first obstetrical visit.  Her obstetrical history is significant for pre-eclampsia. Patient does not intend to breast feed. Pregnancy history fully reviewed. tSVDx2, pelvis proven to 3530g  Patient reports frequent headaches despite taking magnesium and tylenol  prn. Needing tylenol  almost daily.  HISTORY: OB History  Gravida Para Term Preterm AB Living  3 2 2  0 0 2  SAB IAB Ectopic Multiple Live Births  0 0 0 0 2    # Outcome Date GA Lbr Len/2nd Weight Sex Type Anes PTL Lv  3 Current           2 Term 10/17/22 [redacted]w[redacted]d / 01:05 7 lb 12.5 oz (3.53 kg) M Vag-Spont EPI N LIV     Name: RAVYNN, HOGATE     Apgar1: 8  Apgar5: 9  1 Term 05/08/17 [redacted]w[redacted]d  7 lb 7 oz (3.374 kg) M Vag-Spont EPI N LIV     Last pap smear: Lab Results  Component Value Date   DIAGPAP  08/09/2023    - Negative for intraepithelial lesion or malignancy (NILM)    Past Medical History:  Diagnosis Date   Anxiety    Fatigue due to depression 04/12/2023   Left ovarian cyst    Preeclampsia, third trimester 2018   Past Surgical History:  Procedure Laterality Date   TONSILLECTOMY AND ADENOIDECTOMY     Family History  Problem Relation Age of Onset   Healthy Mother    Asthma Father    Hypertension Father    Diabetes Father    Coronary artery disease Father    Cancer Maternal Grandfather    Heart disease Paternal Grandmother    Cancer Paternal Grandmother    Diabetes Paternal Grandfather    Social History   Tobacco Use   Smoking status: Never   Smokeless tobacco: Never  Vaping Use   Vaping status: Never Used  Substance Use Topics   Alcohol use: Not Currently   Drug use: Never   Allergies  Allergen Reactions   Codeine Other (See Comments)    Hallucinations   Codeine Other (See Comments)    Hallucinations   Current Outpatient Medications on File Prior to Visit  Medication Sig Dispense  Refill   aspirin  EC 81 MG tablet Take 81 mg by mouth daily. Swallow whole.     cyanocobalamin  (VITAMIN B12) 1000 MCG/ML injection Inject 1 mL (1,000 mcg total) into the muscle every 30 (thirty) days for 4 doses. 1 mL 3   Doxylamine -Pyridoxine  (DICLEGIS ) 10-10 MG TBEC Take 2 tablets by mouth at bedtime. If symptoms persist, add one tablet in the morning and one in the afternoon 60 tablet 3   magnesium gluconate (MAGONATE) 500 MG tablet Take 500 mg by mouth 2 (two) times daily.     Prenatal Vit-Fe Fumarate-FA (PRENATAL VITAMINS PO) Take 1 capsule by mouth daily.     VENTOLIN  HFA 108 (90 Base) MCG/ACT inhaler Inhale 2 puffs every 6 (six) hours as needed for wheezing. 18 g 0   ipratropium (ATROVENT ) 0.03 % nasal spray 2 sprays into each nostril twice daily for nasal congestion (Patient not taking: Reported on 12/17/2023) 30 mL 0   No current facility-administered medications on file prior to visit.     Exam   Vitals:   12/17/23 0815  BP: 105/69  Pulse: 88  Weight: 136 lb 0.6 oz (61.7 kg)   Fetal  Heart Rate (bpm): 140 - obtained with bedside ultrasound  General:  Alert, oriented and cooperative. Patient is in no acute distress.  Breast: Deferred  Cardiovascular: Normal heart rate noted  Respiratory: Normal respiratory effort, no problems with respiration noted  Abdomen: Soft, non tender  Pelvic: Cervical exam deferred        Extremities: Normal range of motion.  Edema: None  Mental Status: Normal mood and affect. Normal behavior. Normal judgment and thought content.    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 Supervision of high risk pregnancy, antepartum (Primary) 2. [redacted] weeks gestation of pregnancy Initial labs normal. Will collect ferritin/TIBC and B12 levels given history of deficiency. Getting monthly B12 injections, not currently on iron  supplementation Continue prenatal vitamins. Genetic Screening discussed: NIPS ordered. Previously completed carrier screening. Ultrasound discussed; fetal anatomic survey: ordered. Problem list reviewed and updated. The nature of [place] [facility] with multiple MDs and other Advanced Practice Providers was explained to patient; also emphasized that residents, students are part of our team. Routine obstetric precautions reviewed.  3. 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 Moderate major depression (HCC) 5. GAD (generalized anxiety disorder) No mood complaints today  6. History of pre-eclampsia Baseline labs ordered ldASA discussed, to start at 12 weeks  Return in about 5 weeks (around 01/21/2024) for return OB at 16 weeks.  Kieth Carolin, MD Obstetrician & Gynecologist, [facility] for [employer], [facility]

## 2023-12-18 ENCOUNTER — Ambulatory Visit: Payer: Self-pay | Admitting: Obstetrics and Gynecology

## 2023-12-18 DIAGNOSIS — O0990 Supervision of high risk pregnancy, unspecified, unspecified trimester: Secondary | ICD-10-CM

## 2023-12-18 LAB — COMPREHENSIVE METABOLIC PANEL WITH GFR
ALT: 8 IU/L (ref 0–32)
AST: 15 IU/L (ref 0–40)
Albumin: 4.5 g/dL (ref 4.0–5.0)
Alkaline Phosphatase: 61 IU/L (ref 44–121)
BUN/Creatinine Ratio: 14 (ref 9–23)
BUN: 8 mg/dL (ref 6–20)
Bilirubin Total: 0.3 mg/dL (ref 0.0–1.2)
CO2: 19 mmol/L — ABNORMAL LOW (ref 20–29)
Calcium: 10 mg/dL (ref 8.7–10.2)
Chloride: 100 mmol/L (ref 96–106)
Creatinine, Ser: 0.59 mg/dL (ref 0.57–1.00)
Globulin, Total: 2.7 g/dL (ref 1.5–4.5)
Glucose: 72 mg/dL (ref 70–99)
Potassium: 4.5 mmol/L (ref 3.5–5.2)
Sodium: 136 mmol/L (ref 134–144)
Total Protein: 7.2 g/dL (ref 6.0–8.5)
eGFR: 127 mL/min/1.73 (ref >59)

## 2023-12-18 LAB — IRON AND TIBC
Iron Saturation: 29 % (ref 15–55)
Iron: 117 ug/dL (ref 27–159)
Total Iron Binding Capacity: 400 ug/dL (ref 250–450)
UIBC: 283 ug/dL (ref 131–425)

## 2023-12-18 LAB — B12 AND FOLATE PANEL
Folate: >20 ng/mL (ref >3.0)
Vitamin B-12: 576 pg/mL (ref 232–1245)

## 2023-12-18 LAB — FERRITIN: Ferritin: 26 ng/mL (ref 15–150)

## 2023-12-18 LAB — PROTEIN / CREATININE RATIO, URINE
Creatinine, Urine: 40.4 mg/dL
Protein, Ur: 4.9 mg/dL
Protein/Creat Ratio: 121 mg/g{creat} (ref 0–200)

## 2023-12-21 LAB — PANORAMA PRENATAL TEST FULL PANEL:PANORAMA TEST PLUS 5 ADDITIONAL MICRODELETIONS: FETAL FRACTION: 8

## 2023-12-25 ENCOUNTER — Other Ambulatory Visit (HOSPITAL_BASED_OUTPATIENT_CLINIC_OR_DEPARTMENT_OTHER): Payer: Self-pay

## 2023-12-25 ENCOUNTER — Ambulatory Visit (INDEPENDENT_AMBULATORY_CARE_PROVIDER_SITE_OTHER)

## 2023-12-25 ENCOUNTER — Encounter: Payer: Self-pay | Admitting: Obstetrics and Gynecology

## 2023-12-25 DIAGNOSIS — E538 Deficiency of other specified B group vitamins: Secondary | ICD-10-CM | POA: Diagnosis not present

## 2023-12-25 DIAGNOSIS — B3731 Acute candidiasis of vulva and vagina: Secondary | ICD-10-CM

## 2023-12-25 MED ORDER — CYANOCOBALAMIN 1000 MCG/ML IJ SOLN
1000.0000 ug | Freq: Once | INTRAMUSCULAR | Status: AC
  Administered 2023-12-25: 1000 ug via INTRAMUSCULAR

## 2023-12-25 NOTE — Progress Notes (Signed)
 Patient presents for b12 injx. B12 injx administered IM left deltoid. See MAR for additional info.

## 2023-12-26 ENCOUNTER — Inpatient Hospital Stay (HOSPITAL_COMMUNITY)
Admission: AD | Admit: 2023-12-26 | Discharge: 2023-12-26 | Disposition: A | Discharge status: Home / Self Care | Attending: Obstetrics and Gynecology | Admitting: Obstetrics and Gynecology

## 2023-12-26 ENCOUNTER — Other Ambulatory Visit: Payer: Self-pay

## 2023-12-26 DIAGNOSIS — O26891 Other specified pregnancy related conditions, first trimester: Secondary | ICD-10-CM | POA: Diagnosis not present

## 2023-12-26 DIAGNOSIS — R0602 Shortness of breath: Secondary | ICD-10-CM | POA: Diagnosis not present

## 2023-12-26 DIAGNOSIS — L292 Pruritus vulvae: Secondary | ICD-10-CM | POA: Diagnosis not present

## 2023-12-26 DIAGNOSIS — J4520 Mild intermittent asthma, uncomplicated: Secondary | ICD-10-CM | POA: Diagnosis not present

## 2023-12-26 DIAGNOSIS — Z3A12 12 weeks gestation of pregnancy: Secondary | ICD-10-CM

## 2023-12-26 DIAGNOSIS — N898 Other specified noninflammatory disorders of vagina: Secondary | ICD-10-CM | POA: Diagnosis not present

## 2023-12-26 DIAGNOSIS — R0789 Other chest pain: Secondary | ICD-10-CM | POA: Diagnosis present

## 2023-12-26 DIAGNOSIS — O99511 Diseases of the respiratory system complicating pregnancy, first trimester: Secondary | ICD-10-CM | POA: Insufficient documentation

## 2023-12-26 DIAGNOSIS — R519 Headache, unspecified: Secondary | ICD-10-CM | POA: Diagnosis present

## 2023-12-26 DIAGNOSIS — F419 Anxiety disorder, unspecified: Secondary | ICD-10-CM | POA: Diagnosis not present

## 2023-12-26 DIAGNOSIS — O99341 Other mental disorders complicating pregnancy, first trimester: Secondary | ICD-10-CM | POA: Insufficient documentation

## 2023-12-26 DIAGNOSIS — J45909 Unspecified asthma, uncomplicated: Secondary | ICD-10-CM | POA: Insufficient documentation

## 2023-12-26 LAB — URINALYSIS, ROUTINE W REFLEX MICROSCOPIC
Bilirubin Urine: NEGATIVE
Glucose, UA: NEGATIVE mg/dL
Hgb urine dipstick: NEGATIVE
Ketones, ur: NEGATIVE mg/dL
Nitrite: NEGATIVE
Protein, ur: NEGATIVE mg/dL
Specific Gravity, Urine: 1.009 (ref 1.005–1.030)
pH: 7 (ref 5.0–8.0)

## 2023-12-26 LAB — WET PREP, GENITAL
Clue Cells Wet Prep HPF POC: NONE SEEN
Sperm: NONE SEEN
Trich, Wet Prep: NONE SEEN
WBC, Wet Prep HPF POC: >=10 — AB (ref <10)

## 2023-12-26 MED ORDER — ALBUTEROL SULFATE HFA 108 (90 BASE) MCG/ACT IN AERS
1.0000 | INHALATION_SPRAY | Freq: Four times a day (QID) | RESPIRATORY_TRACT | 0 refills | Status: DC | PRN
  Filled 2023-12-26: qty 18, 25d supply, fill #0

## 2023-12-26 MED ORDER — VENTOLIN HFA 108 (90 BASE) MCG/ACT IN AERS: 2.0000 | INHALATION_SPRAY | Freq: Four times a day (QID) | RESPIRATORY_TRACT | 1 refills | Status: DC | PRN

## 2023-12-26 NOTE — MAU Provider Note (Signed)
 History     CSN: [id number]  Arrival date and time: 12/26/23 1727   Event Date/Time   First Provider Initiated Contact with Patient 12/26/23 2021      No chief complaint on file.  Tricia Allen , a  28 y.o. G3P2002 at [redacted]w[redacted]d presents to MAU with complaints of on-going shortness of breath and chest pain. Patient states that for the last week she has noticed that she feels like she cannot take a big deep breath. She states that she also feels like something is on her chest. Reports some intermittent chest pain that she describes as sharp and short lived. She denies attempting to relieve symptoms. Reports having a pulse ox at home and reports always at 98% or higher. She denies feelings of drowning but reports increasing anxiety with pregnancy. She also reports drinking at least 1 liquid IV per day.  She denies vaginal bleeding but endorses vaginal itching.     Reports a hx of asthma, but denies having to use her inhaler in years.    OB History     Gravida  3   Para  2   Term  2   Preterm  0   AB  0   Living  2      SAB  0   IAB  0   Ectopic  0   Multiple      Live Births  2           Past Medical History:  Diagnosis Date   Anxiety    Fatigue due to depression 04/12/2023   Left ovarian cyst    Preeclampsia, third trimester 2018    Past Surgical History:  Procedure Laterality Date   TONSILLECTOMY AND ADENOIDECTOMY      Family History  Problem Relation Age of Onset   Healthy Mother    Asthma Father    Hypertension Father    Diabetes Father    Coronary artery disease Father    Cancer Maternal Grandfather    Heart disease Paternal Grandmother    Cancer Paternal Grandmother    Diabetes Paternal Grandfather     Social History   Tobacco Use   Smoking status: Never   Smokeless tobacco: Never  Vaping Use   Vaping status: Never Used  Substance Use Topics   Alcohol use: Not Currently   Drug use: Never    Allergies:  Allergies   Allergen Reactions   Codeine Other (See Comments)    Hallucinations   Codeine Other (See Comments)    Hallucinations    Medications Prior to Admission  Medication Sig Dispense Refill Last Dose/Taking   aspirin  EC 81 MG tablet Take 81 mg by mouth daily. Swallow whole.      cyanocobalamin  (VITAMIN B12) 1000 MCG/ML injection Inject 1 mL (1,000 mcg total) into the muscle every 30 (thirty) days for 4 doses. 1 mL 3    Doxylamine -Pyridoxine  (DICLEGIS ) 10-10 MG TBEC Take 2 tablets by mouth at bedtime. If symptoms persist, add one tablet in the morning and one in the afternoon 60 tablet 3    ipratropium (ATROVENT ) 0.03 % nasal spray 2 sprays into each nostril twice daily for nasal congestion (Patient not taking: Reported on 12/17/2023) 30 mL 0    magnesium gluconate (MAGONATE) 500 MG tablet Take 500 mg by mouth 2 (two) times daily.      metoCLOPramide  (REGLAN ) 10 MG tablet Take 1 tablet (10 mg total) by mouth 4 (four) times daily as needed for nausea  or vomiting (or headache). 30 tablet 2    Prenatal Vit-Fe Fumarate-FA (PRENATAL VITAMINS PO) Take 1 capsule by mouth daily.      [DISCONTINUED] VENTOLIN  HFA 108 (90 Base) MCG/ACT inhaler Inhale 2 puffs every 6 (six) hours as needed for wheezing. 18 g 0     Review of Systems  Constitutional:  Negative for chills, fatigue and fever.  Eyes:  Negative for pain and visual disturbance.  Respiratory:  Positive for chest tightness and shortness of breath. Negative for apnea, cough, wheezing and stridor.   Cardiovascular:  Positive for chest pain. Negative for palpitations.  Gastrointestinal:  Negative for abdominal pain, constipation, diarrhea, nausea and vomiting.  Genitourinary:  Negative for difficulty urinating, dysuria, pelvic pain, vaginal bleeding, vaginal discharge and vaginal pain.  Musculoskeletal:  Negative for back pain.  Skin:  Negative for color change.  Neurological:  Negative for dizziness, seizures, syncope, weakness, light-headedness,  numbness and headaches.  Psychiatric/Behavioral:  Negative for confusion and suicidal ideas. The patient is nervous/anxious.    Physical Exam   Blood pressure 107/65, pulse 93, temperature 98.5 F (36.9 C), temperature source Oral, resp. rate 18, height 5' 2 (1.575 m), weight 63 kg, last menstrual period 09/27/2023, SpO2 100%, not currently breastfeeding.  Physical Exam Vitals and nursing note reviewed.  Constitutional:      General: She is not in acute distress.    Appearance: Normal appearance. She is not ill-appearing or diaphoretic.  HENT:     Head: Normocephalic.  Cardiovascular:     Rate and Rhythm: Normal rate and regular rhythm.  Pulmonary:     Effort: Pulmonary effort is normal.  Chest:     Chest wall: No deformity or tenderness.  Musculoskeletal:     Cervical back: Normal range of motion.  Skin:    General: Skin is warm and dry.  Neurological:     Mental Status: She is alert and oriented to person, place, and time.  Psychiatric:        Mood and Affect: Mood normal.    FHT obtained in triage   MAU Course  Procedures Orders Placed This Encounter  Procedures   Culture, OB Urine   Wet prep, genital   Urinalysis, Routine w reflex microscopic -Urine, Clean Catch   ED EKG   Discharge patient Discharge disposition: 01-Home or Self Care; Discharge patient date: 12/26/2023   Results for orders placed or performed during the hospital encounter of 12/26/23 (from the past 24 hours)  Urinalysis, Routine w reflex microscopic -Urine, Clean Catch     Status: Abnormal   Collection Time: 12/26/23  6:47 PM  Result Value Ref Range   Color, Urine STRAW (A) YELLOW   APPearance HAZY (A) CLEAR   Specific Gravity, Urine 1.009 1.005 - 1.030   pH 7.0 5.0 - 8.0   Glucose, UA NEGATIVE NEGATIVE mg/dL   Hgb urine dipstick NEGATIVE NEGATIVE   Bilirubin Urine NEGATIVE NEGATIVE   Ketones, ur NEGATIVE NEGATIVE mg/dL   Protein, ur NEGATIVE NEGATIVE mg/dL   Nitrite NEGATIVE NEGATIVE    Leukocytes,Ua MODERATE (A) NEGATIVE   RBC / HPF 0-5 0 - 5 RBC/hpf   WBC, UA 6-10 0 - 5 WBC/hpf   Bacteria, UA FEW (A) NONE SEEN   Squamous Epithelial / HPF 6-10 0 - 5 /HPF     MDM - UA hazy with bacteria sent for culture to rule out UTI. - EKG Normal Sinus  - VS normal in MAU - CNM reviewed previously collected CMP and CBC. Low  suspicion for iron  related SOB or abnormal electrolytes imbalances .  - anxiety related SOB - self swab collected to rule out yeast.   - plan for discharge   Assessment and Plan   1. Intermittent asthma without complication, unspecified asthma severity   2. Shortness of breath   3. Anxiety   4. [redacted] weeks gestation of pregnancy   5. Vaginal itching    - Discussed with patient that anxiety can manifest differently in pregnancy.  - Reviewed that anxiety can also worsen in pregnancy. Also discussed Liquid IV hydration drinks have a high salt content and may be acting as a contributory factor. Recommended to decreased from 7 liquid IVs to 2-3 per week  - Reviewed worsening signs and return precautions.  - Rx for albuterol  inhaler sent to outpatient pharmacy. - Patient discharged home in stable condition  Claris CHRISTELLA Cedar, MSN CNM  12/26/2023, 8:21 PM

## 2023-12-26 NOTE — MAU Note (Signed)
 Tricia Allen is a 28 y.o. at [redacted]w[redacted]d here in MAU reporting: SOB for the last week that is getting worse with chest pain- that is short and stabbing- HA currently but better since taking tylenol  this morning Denies urinary symptoms,   LMP:  Onset of complaint: week ago Pain score: 4/10 Vitals:   12/26/23 1833  BP: 107/65  Pulse: 93  Resp: 18  Temp: 98.5 F (36.9 C)  SpO2: 100%     FHT: 169  Lab orders placed from triage: EKG

## 2023-12-27 ENCOUNTER — Other Ambulatory Visit (HOSPITAL_BASED_OUTPATIENT_CLINIC_OR_DEPARTMENT_OTHER): Payer: Self-pay

## 2023-12-28 DIAGNOSIS — F331 Major depressive disorder, recurrent, moderate: Secondary | ICD-10-CM | POA: Diagnosis not present

## 2023-12-28 LAB — CULTURE, OB URINE: Culture: 10000 — AB

## 2023-12-28 MED ORDER — CLOTRIMAZOLE 1 % VA CREA: 1.0000 | TOPICAL_CREAM | Freq: Every day | VAGINAL | 2 refills | Status: AC

## 2024-01-07 ENCOUNTER — Other Ambulatory Visit (HOSPITAL_BASED_OUTPATIENT_CLINIC_OR_DEPARTMENT_OTHER): Payer: Self-pay

## 2024-01-11 DIAGNOSIS — F331 Major depressive disorder, recurrent, moderate: Secondary | ICD-10-CM | POA: Diagnosis not present

## 2024-01-15 DIAGNOSIS — Z419 Encounter for procedure for purposes other than remedying health state, unspecified: Secondary | ICD-10-CM | POA: Diagnosis not present

## 2024-01-16 ENCOUNTER — Other Ambulatory Visit (HOSPITAL_BASED_OUTPATIENT_CLINIC_OR_DEPARTMENT_OTHER): Payer: Self-pay

## 2024-01-16 DIAGNOSIS — R002 Palpitations: Secondary | ICD-10-CM | POA: Diagnosis not present

## 2024-01-16 DIAGNOSIS — Q9359 Other deletions of part of a chromosome: Secondary | ICD-10-CM | POA: Diagnosis not present

## 2024-01-16 DIAGNOSIS — Z8759 Personal history of other complications of pregnancy, childbirth and the puerperium: Secondary | ICD-10-CM | POA: Diagnosis not present

## 2024-01-16 DIAGNOSIS — F32A Depression, unspecified: Secondary | ICD-10-CM | POA: Diagnosis not present

## 2024-01-16 DIAGNOSIS — O99342 Other mental disorders complicating pregnancy, second trimester: Secondary | ICD-10-CM | POA: Diagnosis not present

## 2024-01-16 DIAGNOSIS — Z3A15 15 weeks gestation of pregnancy: Secondary | ICD-10-CM | POA: Diagnosis not present

## 2024-01-16 MED ORDER — BUSPIRONE HCL 5 MG PO TABS
5.0000 mg | ORAL_TABLET | Freq: Two times a day (BID) | ORAL | 0 refills | Status: DC
  Filled 2024-01-16 (×2): qty 60, 30d supply, fill #0

## 2024-01-17 DIAGNOSIS — R002 Palpitations: Secondary | ICD-10-CM | POA: Diagnosis not present

## 2024-01-18 DIAGNOSIS — F331 Major depressive disorder, recurrent, moderate: Secondary | ICD-10-CM | POA: Diagnosis not present

## 2024-01-22 DIAGNOSIS — I10 Essential (primary) hypertension: Secondary | ICD-10-CM | POA: Diagnosis not present

## 2024-01-22 DIAGNOSIS — O26892 Other specified pregnancy related conditions, second trimester: Secondary | ICD-10-CM | POA: Diagnosis not present

## 2024-01-22 DIAGNOSIS — M79661 Pain in right lower leg: Secondary | ICD-10-CM | POA: Diagnosis not present

## 2024-01-22 DIAGNOSIS — Z3A16 16 weeks gestation of pregnancy: Secondary | ICD-10-CM | POA: Diagnosis not present

## 2024-01-22 DIAGNOSIS — R0602 Shortness of breath: Secondary | ICD-10-CM | POA: Diagnosis not present

## 2024-01-22 DIAGNOSIS — R002 Palpitations: Secondary | ICD-10-CM | POA: Diagnosis not present

## 2024-01-24 ENCOUNTER — Other Ambulatory Visit (HOSPITAL_BASED_OUTPATIENT_CLINIC_OR_DEPARTMENT_OTHER): Payer: Self-pay

## 2024-01-25 ENCOUNTER — Encounter: Admitting: Obstetrics and Gynecology

## 2024-01-25 DIAGNOSIS — F331 Major depressive disorder, recurrent, moderate: Secondary | ICD-10-CM | POA: Diagnosis not present

## 2024-02-06 DIAGNOSIS — R079 Chest pain, unspecified: Secondary | ICD-10-CM | POA: Diagnosis not present

## 2024-02-06 DIAGNOSIS — R252 Cramp and spasm: Secondary | ICD-10-CM | POA: Diagnosis not present

## 2024-02-06 DIAGNOSIS — O99892 Other specified diseases and conditions complicating childbirth: Secondary | ICD-10-CM | POA: Diagnosis not present

## 2024-02-06 DIAGNOSIS — Z885 Allergy status to narcotic agent status: Secondary | ICD-10-CM | POA: Diagnosis not present

## 2024-02-06 DIAGNOSIS — Z7951 Long term (current) use of inhaled steroids: Secondary | ICD-10-CM | POA: Diagnosis not present

## 2024-02-06 DIAGNOSIS — Z79899 Other long term (current) drug therapy: Secondary | ICD-10-CM | POA: Diagnosis not present

## 2024-02-06 DIAGNOSIS — O26892 Other specified pregnancy related conditions, second trimester: Secondary | ICD-10-CM | POA: Diagnosis not present

## 2024-02-06 DIAGNOSIS — Z3A18 18 weeks gestation of pregnancy: Secondary | ICD-10-CM | POA: Diagnosis not present

## 2024-02-08 DIAGNOSIS — F331 Major depressive disorder, recurrent, moderate: Secondary | ICD-10-CM | POA: Diagnosis not present

## 2024-02-13 DIAGNOSIS — Z3482 Encounter for supervision of other normal pregnancy, second trimester: Secondary | ICD-10-CM | POA: Diagnosis not present

## 2024-02-13 DIAGNOSIS — Z3689 Encounter for other specified antenatal screening: Secondary | ICD-10-CM | POA: Diagnosis not present

## 2024-02-13 DIAGNOSIS — Z3A19 19 weeks gestation of pregnancy: Secondary | ICD-10-CM | POA: Diagnosis not present

## 2024-02-15 ENCOUNTER — Ambulatory Visit

## 2024-02-15 ENCOUNTER — Ambulatory Visit: Attending: Obstetrics and Gynecology | Admitting: Obstetrics and Gynecology

## 2024-02-15 ENCOUNTER — Other Ambulatory Visit: Payer: Self-pay | Admitting: *Deleted

## 2024-02-15 VITALS — BP 106/69

## 2024-02-15 DIAGNOSIS — Z3A20 20 weeks gestation of pregnancy: Secondary | ICD-10-CM

## 2024-02-15 DIAGNOSIS — O0990 Supervision of high risk pregnancy, unspecified, unspecified trimester: Secondary | ICD-10-CM

## 2024-02-15 DIAGNOSIS — Z36 Encounter for antenatal screening for chromosomal anomalies: Secondary | ICD-10-CM | POA: Insufficient documentation

## 2024-02-15 DIAGNOSIS — Z7982 Long term (current) use of aspirin: Secondary | ICD-10-CM | POA: Insufficient documentation

## 2024-02-15 DIAGNOSIS — O09892 Supervision of other high risk pregnancies, second trimester: Secondary | ICD-10-CM

## 2024-02-15 DIAGNOSIS — O358XX0 Maternal care for other (suspected) fetal abnormality and damage, not applicable or unspecified: Secondary | ICD-10-CM

## 2024-02-15 DIAGNOSIS — Z419 Encounter for procedure for purposes other than remedying health state, unspecified: Secondary | ICD-10-CM | POA: Diagnosis not present

## 2024-02-15 DIAGNOSIS — O09292 Supervision of pregnancy with other poor reproductive or obstetric history, second trimester: Secondary | ICD-10-CM | POA: Diagnosis not present

## 2024-02-15 DIAGNOSIS — Z8759 Personal history of other complications of pregnancy, childbirth and the puerperium: Secondary | ICD-10-CM | POA: Insufficient documentation

## 2024-02-15 DIAGNOSIS — Z368A Encounter for antenatal screening for other genetic defects: Secondary | ICD-10-CM | POA: Diagnosis not present

## 2024-02-15 DIAGNOSIS — Q9359 Other deletions of part of a chromosome: Secondary | ICD-10-CM

## 2024-02-15 NOTE — Progress Notes (Signed)
 Maternal-Fetal Medicine Consultation  Name: Tricia Allen  MRN: [id number]  GA: [id number] [redacted]w[redacted]d   Patient is here for fetal anatomy scan. On cell-free fetal DNA screening, the risks of aneuploidies are not increased. MSAFP screening showed low risk for open-neural tube defects.  Obstetric history significant for 2 term vaginal deliveries.  Her first child has Smith-Magenis syndrome.  Had genetic counseling in December 2023 and the previous pregnancy and had opted not to have amniocentesis.  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  Both her pregnancies were complicated by preeclampsia.  Patient takes low-dose aspirin  prophylaxis.  Ultrasound We performed fetal anatomy scan. No makers of aneuploidies or fetal structural defects are seen. Fetal biometry is consistent with her previously-established dates. Amniotic fluid is normal and good fetal activity is seen. Patient understands the limitations of ultrasound in detecting fetal anomalies.   Short Interpregnancy Interval It is defined as the time interval (18 to 24 months) between the end of previous pregnancy and the beginning of next pregnancy.  The impact of short pregnancy interval on the outcome of subsequent pregnancy is uncertain. Some studies have shown that preterm delivery, and fetal growth restriction rates are increased in pregnancies with short interpregnancy interval.  However, it is not supported by other reports. Overall, we should expect good pregnancy outcomes if there are no other high-risk factors.  History of preeclampsia I counseled the patient that the recurrence rate is about 25% to 50% in subsequent pregnancies.  I discussed the benefit of low-dose aspirin  prophylaxis that delays or prevents preeclampsia.  I reassured the patient of today's ultrasound findings.  However, only amniocentesis will give a definitive result on some of the genetic conditions.  I explained the procedures and possible  complication of miscarriage (1 and 500 procedures).  Patient opted not have amniocentesis.  Recommendations -An appointment was made for her to return in 12 weeks for fetal growth assessment.     Consultation including face-to-face (more than 50%) counseling 30 minutes.

## 2024-02-22 ENCOUNTER — Encounter

## 2024-02-22 DIAGNOSIS — F331 Major depressive disorder, recurrent, moderate: Secondary | ICD-10-CM | POA: Diagnosis not present

## 2024-02-23 DIAGNOSIS — R079 Chest pain, unspecified: Secondary | ICD-10-CM | POA: Diagnosis not present

## 2024-02-27 ENCOUNTER — Ambulatory Visit (HOSPITAL_BASED_OUTPATIENT_CLINIC_OR_DEPARTMENT_OTHER)

## 2024-02-28 ENCOUNTER — Other Ambulatory Visit (HOSPITAL_BASED_OUTPATIENT_CLINIC_OR_DEPARTMENT_OTHER): Payer: Self-pay

## 2024-02-28 MED ORDER — BUSPIRONE HCL 5 MG PO TABS
5.0000 mg | ORAL_TABLET | Freq: Two times a day (BID) | ORAL | 3 refills | Status: DC
  Filled 2024-02-28: qty 60, 30d supply, fill #0

## 2024-02-29 ENCOUNTER — Ambulatory Visit: Admitting: Family Medicine

## 2024-02-29 ENCOUNTER — Encounter: Payer: Self-pay | Admitting: Family Medicine

## 2024-02-29 ENCOUNTER — Other Ambulatory Visit (HOSPITAL_BASED_OUTPATIENT_CLINIC_OR_DEPARTMENT_OTHER): Payer: Self-pay

## 2024-02-29 VITALS — BP 100/60 | HR 99 | Temp 97.8°F | Resp 18 | Ht 62.0 in | Wt 153.2 lb

## 2024-02-29 DIAGNOSIS — J020 Streptococcal pharyngitis: Secondary | ICD-10-CM | POA: Diagnosis not present

## 2024-02-29 DIAGNOSIS — J029 Acute pharyngitis, unspecified: Secondary | ICD-10-CM | POA: Diagnosis not present

## 2024-02-29 LAB — POCT RAPID STREP A (OFFICE): Rapid Strep A Screen: POSITIVE — AB

## 2024-02-29 MED ORDER — AMOXICILLIN 400 MG/5ML PO SUSR
1000.0000 mg | Freq: Two times a day (BID) | ORAL | 0 refills | Status: AC
  Filled 2024-02-29: qty 300, 10d supply, fill #0

## 2024-02-29 NOTE — Assessment & Plan Note (Addendum)
 Strep test - positive - Try honey and lemon for sore throat - Gargle with warm salt water - Try hot tea to soothe the throat   Orders:   POCT rapid strep A

## 2024-02-29 NOTE — Progress Notes (Signed)
 Acute Office Visit  Subjective:    Patient ID: Tricia Allen, female    DOB: Nov 01, 1995, 28 y.o.   MRN: [id number]  Chief Complaint  Patient presents with   Sore Throat   Discussed the use of AI scribe software for clinical note transcription with the patient, who gave verbal consent to proceed.  History of Present Illness   Tricia Allen is a 28 year old female who presents with sore throat and swallowing difficulties. She is accompanied by her child.  Pharyngitis and odynophagia - Sore throat onset earlier this week - Swallowing difficulties began yesterday - Ear pain sensation described as 'like I'm under water and it'll hurt' - No fever or cough  - Chills and sensation of feeling 'really hot' as if breaking a fever - Prefers liquid medication due to pain with swallowing pills  History of streptococcal pharyngitis - Severe case of strep throat approximately five to six months ago - Similar experience previously when her son transmitted strep throat during a prior pregnancy  Pregnancy - Currently pregnant with a due date of January 29th - Has switched OB care back to this location for convenience  Depression and anxiety - Depression has improved since starting a new medication, Buspar  5 mg BID - Medication has also helped with anxiety - Symptoms of depression and anxiety persist but are not completely alleviated       Past Medical History:  Diagnosis Date   Anxiety    Fatigue due to depression 04/12/2023   Left ovarian cyst    Preeclampsia, third trimester 2018    Past Surgical History:  Procedure Laterality Date   TONSILLECTOMY AND ADENOIDECTOMY     WISDOM TOOTH EXTRACTION      Family History  Problem Relation Age of Onset   Healthy Mother    Asthma Father    Hypertension Father    Diabetes Father    Coronary artery disease Father    Cancer Maternal Grandfather    Heart disease Paternal Grandmother    Cancer Paternal Grandmother     Diabetes Paternal Grandfather     Social History   Socioeconomic History   Marital status: Single    Spouse name: Not on file   Number of children: 2   Years of education: Not on file   Highest education level: 12th grade  Occupational History   Not on file  Tobacco Use   Smoking status: Never   Smokeless tobacco: Never  Vaping Use   Vaping status: Never Used  Substance and Sexual Activity   Alcohol use: Not Currently   Drug use: Never   Sexual activity: Yes    Birth control/protection: None  Other Topics Concern   Not on file  Social History Narrative   ** Merged History Encounter **    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 boyfriend and two kids   Caffeine  One soda a day   Social Drivers of Health   Financial Resource Strain: Low Risk  (02/28/2024)   Overall Financial Resource Strain (CARDIA)    Difficulty of Paying Living Expenses: Not hard at all  Food Insecurity: No Food Insecurity (02/28/2024)   Hunger Vital Sign    Worried About Running Out of Food in the Last Year: Never true    Ran Out of Food in the Last Year: Never true  Transportation Needs: No Transportation Needs (02/28/2024)   PRAPARE - Administrator, Civil Service (Medical): No  Lack of Transportation (Non-Medical): No  Physical Activity: Inactive (02/28/2024)   Exercise Vital Sign    Days of Exercise per Week: 2 days    Minutes of Exercise per Session: 0 min  Stress: Stress Concern Present (02/28/2024)   [employer] of Occupational Health - Occupational Stress Questionnaire    Feeling of Stress: To some extent  Social Connections: Moderately Isolated (02/28/2024)   Social Connection and Isolation Panel    Frequency of Communication with Friends and Family: More than three times a week    Frequency of Social Gatherings with Friends and Family: Once a week    Attends Religious Services: Never    Database administrator or Organizations: No    Attends Hospital doctor: Not on file    Marital Status: Living with partner  Intimate Partner Violence: Not At Risk (10/16/2022)   Humiliation, Afraid, Rape, and Kick questionnaire    Fear of Current or Ex-Partner: No    Emotionally Abused: No    Physically Abused: No    Sexually Abused: No    Outpatient Medications Prior to Visit  Medication Sig Dispense Refill   albuterol  (VENTOLIN  HFA) 108 (90 Base) MCG/ACT inhaler Inhale 1-2 puffs into the lungs every 6 (six) hours as needed for wheezing or shortness of breath. 18 g 0   aspirin  EC 81 MG tablet Take 81 mg by mouth daily. Swallow whole.     busPIRone  (BUSPAR ) 5 MG tablet Take 1 tablet (5 mg total) by mouth 2 (two) times daily. 60 tablet 3   Prenatal Vit-Fe Fumarate-FA (PRENATAL VITAMINS PO) Take 1 capsule by mouth daily.     No facility-administered medications prior to visit.    Allergies  Allergen Reactions   Codeine Other (See Comments)    Hallucinations   Codeine Other (See Comments)    Hallucinations    Review of Systems  Constitutional:  Positive for chills and diaphoresis. Negative for fatigue and fever.  HENT:  Positive for sore throat and trouble swallowing. Negative for congestion and ear pain.   Respiratory:  Positive for cough. Negative for shortness of breath.   Cardiovascular:  Negative for chest pain.  Gastrointestinal:  Negative for abdominal pain, constipation, diarrhea, nausea and vomiting.  Genitourinary:  Negative for dysuria, frequency and urgency.  Musculoskeletal:  Negative for arthralgias, back pain and myalgias.  Neurological:  Negative for dizziness and headaches.  Psychiatric/Behavioral:  Negative for agitation and sleep disturbance. The patient is not nervous/anxious.        Objective:        02/29/2024    8:34 AM 02/15/2024    2:03 PM 12/26/2023    6:33 PM  Vitals with BMI  Height 5' 2  5' 2  Weight 153 lbs 3 oz  139 lbs  BMI 28.01  25.42  Systolic 100 106 892  Diastolic 60 69 65  Pulse 99  93     No data found.   Physical Exam Vitals reviewed.  Constitutional:      General: She is not in acute distress.    Appearance: Normal appearance. She is ill-appearing.  HENT:     Right Ear: A middle ear effusion is present.     Left Ear: A middle ear effusion is present.     Mouth/Throat:     Mouth: No oral lesions.     Pharynx: Pharyngeal swelling and posterior oropharyngeal erythema present. No oropharyngeal exudate.  Neck:     Vascular: No carotid bruit.  Cardiovascular:     Rate and Rhythm: Normal rate and regular rhythm.     Heart sounds: Normal heart sounds.  Pulmonary:     Effort: Pulmonary effort is normal.     Breath sounds: Normal breath sounds.  Abdominal:     General: Bowel sounds are normal.     Palpations: Abdomen is soft.     Tenderness: There is no abdominal tenderness.  Skin:    General: Skin is warm.  Neurological:     Mental Status: She is alert. Mental status is at baseline.  Psychiatric:        Mood and Affect: Mood normal.        Behavior: Behavior normal.     Health Maintenance Due  Topic Date Due   COVID-19 Vaccine (1) Never done   Pneumococcal Vaccine (1 of 2 - PCV) Never done   Hepatitis B Vaccines 19-59 Average Risk (1 of 3 - 19+ 3-dose series) Never done   HPV VACCINES (1 - 3-dose SCDM series) Never done       Topic Date Due   Hepatitis B Vaccines 19-59 Average Risk (1 of 3 - 19+ 3-dose series) Never done   HPV VACCINES (1 - 3-dose SCDM series) Never done     Lab Results  Component Value Date   TSH 1.34 04/16/2023   Lab Results  Component Value Date   WBC 8.8 11/27/2023   HGB 12.4 11/27/2023   HCT 37.3 11/27/2023   MCV 95 11/27/2023   PLT 351 11/27/2023   Lab Results  Component Value Date   NA 136 12/17/2023   K 4.5 12/17/2023   CO2 19 (L) 12/17/2023   GLUCOSE 72 12/17/2023   BUN 8 12/17/2023   CREATININE 0.59 12/17/2023   BILITOT 0.3 12/17/2023   ALKPHOS 61 12/17/2023   AST 15 12/17/2023   ALT 8 12/17/2023    PROT 7.2 12/17/2023   ALBUMIN 4.5 12/17/2023   CALCIUM 10.0 12/17/2023   ANIONGAP 12 10/18/2023   EGFR 127 12/17/2023   GFR 105.65 10/19/2023   No results found for: CHOL No results found for: HDL No results found for: LDLCALC No results found for: TRIG No results found for: CHOLHDL Lab Results  Component Value Date   HGBA1C 5.1 06/22/2023        Results for orders placed or performed in visit on 02/29/24  POCT rapid strep A   Collection Time: 02/29/24  9:14 AM  Result Value Ref Range   Rapid Strep A Screen Positive (A) Negative     Assessment & Plan:   Assessment & Plan Strep throat Throat erythema despite having tonsils out. Associated symptoms of ear fullness and diaphoresis - Prescribed liquid amoxicillin  to [facility] in [place] - Discussed medication safety during pregnancy and confirmed antibiotic safety. Orders:   amoxicillin  (AMOXIL ) 400 MG/5ML suspension; Take 12.5 mLs (1,000 mg total) by mouth 2 (two) times daily for 10 days.  Sore throat Strep test - positive - Try honey and lemon for sore throat - Gargle with warm salt water - Try hot tea to soothe the throat   Orders:   POCT rapid strep A     Meds ordered this encounter  Medications   amoxicillin  (AMOXIL ) 400 MG/5ML suspension    Sig: Take 12.5 mLs (1,000 mg total) by mouth 2 (two) times daily for 10 days.    Dispense:  300 mL    Refill:  0    Orders Placed This Encounter  Procedures  POCT rapid strep A     Follow-up: No follow-ups on file.  An After Visit Summary was printed and given to the patient.  Harrie Cedar, FNP [facility] [phone]

## 2024-02-29 NOTE — Assessment & Plan Note (Addendum)
 Throat erythema despite having tonsils out. Associated symptoms of ear fullness and diaphoresis - Prescribed liquid amoxicillin  to [facility] in [place] - Discussed medication safety during pregnancy and confirmed antibiotic safety. Orders:   amoxicillin  (AMOXIL ) 400 MG/5ML suspension; Take 12.5 mLs (1,000 mg total) by mouth 2 (two) times daily for 10 days.

## 2024-03-14 DIAGNOSIS — H539 Unspecified visual disturbance: Secondary | ICD-10-CM | POA: Diagnosis not present

## 2024-03-14 DIAGNOSIS — Z3A24 24 weeks gestation of pregnancy: Secondary | ICD-10-CM | POA: Diagnosis not present

## 2024-03-14 DIAGNOSIS — O26892 Other specified pregnancy related conditions, second trimester: Secondary | ICD-10-CM | POA: Diagnosis not present

## 2024-03-18 ENCOUNTER — Ambulatory Visit: Admitting: Family Medicine

## 2024-03-18 VITALS — BP 128/72 | HR 94 | Temp 97.7°F | Resp 18 | Ht 62.0 in | Wt 158.4 lb

## 2024-03-18 DIAGNOSIS — Z3A24 24 weeks gestation of pregnancy: Secondary | ICD-10-CM | POA: Insufficient documentation

## 2024-03-18 DIAGNOSIS — M5441 Lumbago with sciatica, right side: Secondary | ICD-10-CM | POA: Diagnosis not present

## 2024-03-18 DIAGNOSIS — M545 Low back pain, unspecified: Secondary | ICD-10-CM | POA: Diagnosis not present

## 2024-03-18 DIAGNOSIS — E538 Deficiency of other specified B group vitamins: Secondary | ICD-10-CM | POA: Diagnosis not present

## 2024-03-18 DIAGNOSIS — D508 Other iron deficiency anemias: Secondary | ICD-10-CM | POA: Diagnosis not present

## 2024-03-18 LAB — POCT URINALYSIS DIP (CLINITEK)
Bilirubin, UA: NEGATIVE
Blood, UA: NEGATIVE
Glucose, UA: NEGATIVE mg/dL
Ketones, POC UA: NEGATIVE mg/dL
Nitrite, UA: NEGATIVE
POC PROTEIN,UA: NEGATIVE
Spec Grav, UA: 1.015 (ref 1.010–1.025)
Urobilinogen, UA: 0.2 U/dL
pH, UA: 6.5 (ref 5.0–8.0)

## 2024-03-18 NOTE — Assessment & Plan Note (Addendum)
 B12 vitamin deficiency Due for blood work to check B12  Last vitamin B12 and Folate Lab Results  Component Value Date   VITAMINB12 576 12/17/2023   FOLATE >20.0 12/17/2023  - Order blood tests to check B12 Orders:   B12 and Folate Panel

## 2024-03-18 NOTE — Assessment & Plan Note (Addendum)
 Currently taking prenatal vitamins with iron . Lab Results  Component Value Date   IRON  117 12/17/2023   TIBC 400 12/17/2023   FERRITIN 26 12/17/2023  - labs drawn, Await labs/testing for assessment and recommendations Orders:   Iron , TIBC and Ferritin Panel   CBC with Differential

## 2024-03-18 NOTE — Assessment & Plan Note (Addendum)
 Low back pain with right-sided sciatica during pregnancy Worsening low back pain with right-sided sciatica. Pain under right rib, extending down right side, worsens with standing. Non-pharmacological management discussed.  Differential include UTI or musculoskeletal. UA in the office showed Lab Results  Component Value Date   COLORU yellow 03/18/2024   CLARITYU cloudy (A) 03/18/2024   GLUCOSEUR negative 03/18/2024   BILIRUBINUR negative 03/18/2024   KETONESU trace 12/06/2022   SPECGRAV 1.015 03/18/2024   RBCUR negative 03/18/2024   PHUR 6.5 03/18/2024   PROTEINUR NEGATIVE 12/26/2023   UROBILINOGEN 0.2 03/18/2024   LEUKOCYTESUR Moderate (2+) (A) 03/18/2024  - will wait for culture to come back before prescribing an antibiotic - Recommend stretches for sciatica.  - Increase fluid intake - Advise use of heat with caution to avoid overheating. Orders:   POCT URINALYSIS DIP (CLINITEK)   Urine Culture   Comprehensive metabolic panel with GFR

## 2024-03-18 NOTE — Assessment & Plan Note (Signed)
 Pregnancy Pregnant with minimal spotting and back pain. No urinary symptoms. Discussed potential kidney issues, no current UTI symptoms. - Send urine for culture to rule out infection. - Check kidney function as last checked on July 14th. - Encourage increased fluid intake.

## 2024-03-18 NOTE — Progress Notes (Signed)
 Acute Office Visit  Subjective:    Patient ID: Tricia Allen, female    DOB: 07-03-1995, 28 y.o.   MRN: [id number]  Chief Complaint  Patient presents with   Back Pain   Discussed the use of AI scribe software for clinical note transcription with the patient, who gave verbal consent to proceed.  History of Present Illness   Tricia Allen is a 28 year old female who presents with back pain and sciatica during pregnancy.  Lumbosacral pain and sciatica - Back pain began mid-last week and worsened over the weekend - Pain is a dull, aching sensation located under the right rib - Sciatica pain is severe during this pregnancy, primarily affecting the right side - Sciatica has been present for a couple of weeks - Pain makes it difficult to stand back up - Currently pregnant - Taking Tylenol  for pain management  Genitourinary symptoms - No urinary symptoms such as urgency, dysuria, or hematuria - Spotting occurred on Saturday, limited to underwear and not present when wiping - History of kidney infection remote, with no recent issues - Drinking plenty of fluids - Kidney function last checked during pre-OB appointment in July  Hematologic status in pregnancy - Not taking additional iron  supplements beyond prenatal vitamins - Last iron  check did not indicate extremely low levels      Past Medical History:  Diagnosis Date   Anxiety    Fatigue due to depression 04/12/2023   Left ovarian cyst    Preeclampsia, third trimester 2018    Past Surgical History:  Procedure Laterality Date   TONSILLECTOMY AND ADENOIDECTOMY     WISDOM TOOTH EXTRACTION      Family History  Problem Relation Age of Onset   Healthy Mother    Asthma Father    Hypertension Father    Diabetes Father    Coronary artery disease Father    Cancer Maternal Grandfather    Heart disease Paternal Grandmother    Cancer Paternal Grandmother    Diabetes Paternal Grandfather     Social History    Socioeconomic History   Marital status: Single    Spouse name: Not on file   Number of children: 2   Years of education: Not on file   Highest education level: 12th grade  Occupational History   Not on file  Tobacco Use   Smoking status: Never   Smokeless tobacco: Never  Vaping Use   Vaping status: Never Used  Substance and Sexual Activity   Alcohol use: Not Currently   Drug use: Never   Sexual activity: Yes    Birth control/protection: None  Other Topics Concern   Not on file  Social History Narrative   ** Merged History Encounter **    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 boyfriend and two kids   Caffeine  One soda a day   Social Drivers of Health   Financial Resource Strain: Low Risk  (03/18/2024)   Overall Financial Resource Strain (CARDIA)    Difficulty of Paying Living Expenses: Not hard at all  Food Insecurity: No Food Insecurity (03/18/2024)   Hunger Vital Sign    Worried About Running Out of Food in the Last Year: Never true    Ran Out of Food in the Last Year: Never true  Transportation Needs: No Transportation Needs (03/18/2024)   PRAPARE - Administrator, Civil Service (Medical): No    Lack of Transportation (Non-Medical): No  Physical Activity: Inactive (  03/18/2024)   Exercise Vital Sign    Days of Exercise per Week: 0 days    Minutes of Exercise per Session: Not on file  Stress: No Stress Concern Present (03/18/2024)   [employer] of Occupational Health - Occupational Stress Questionnaire    Feeling of Stress: Only a little  Recent Concern: Stress - Stress Concern Present (02/28/2024)   [employer] of Occupational Health - Occupational Stress Questionnaire    Feeling of Stress: To some extent  Social Connections: Moderately Isolated (03/18/2024)   Social Connection and Isolation Panel    Frequency of Communication with Friends and Family: More than three times a week    Frequency of Social Gatherings with Friends and  Family: Once a week    Attends Religious Services: Never    Database administrator or Organizations: No    Attends Engineer, structural: Not on file    Marital Status: Living with partner  Intimate Partner Violence: Not At Risk (10/16/2022)   Humiliation, Afraid, Rape, and Kick questionnaire    Fear of Current or Ex-Partner: No    Emotionally Abused: No    Physically Abused: No    Sexually Abused: No    Outpatient Medications Prior to Visit  Medication Sig Dispense Refill   aspirin  EC 81 MG tablet Take 81 mg by mouth daily. Swallow whole.     busPIRone  (BUSPAR ) 5 MG tablet Take 1 tablet (5 mg total) by mouth 2 (two) times daily. 60 tablet 3   Ferrous Sulfate  (IRON  PO) Take 1 tablet by mouth daily.     Prenatal Vit-Fe Fumarate-FA (PRENATAL VITAMINS PO) Take 1 capsule by mouth daily.     No facility-administered medications prior to visit.    Allergies  Allergen Reactions   Codeine Other (See Comments)    Hallucinations   Codeine Other (See Comments)    Hallucinations    Review of Systems  Constitutional:  Negative for chills, diaphoresis, fatigue and fever.  HENT:  Negative for congestion, ear pain and sinus pain.   Eyes: Negative.   Respiratory:  Negative for cough and shortness of breath.   Cardiovascular:  Negative for chest pain.  Gastrointestinal:  Positive for diarrhea and nausea. Negative for abdominal pain, constipation and vomiting.  Endocrine: Negative.   Genitourinary:  Negative for dysuria, frequency and urgency.  Musculoskeletal:  Positive for back pain. Negative for arthralgias.  Allergic/Immunologic: Negative.   Neurological:  Negative for dizziness, weakness, light-headedness and headaches.  Psychiatric/Behavioral:  Negative for dysphoric mood. The patient is not nervous/anxious.        Objective:        03/18/2024    3:24 PM 02/29/2024    8:34 AM 02/15/2024    2:03 PM  Vitals with BMI  Height 5' 2 5' 2   Weight 158 lbs 6 oz 153 lbs 3 oz    BMI 28.96 28.01   Systolic 128 100 893  Diastolic 72 60 69  Pulse 94 99     No data found.   Physical Exam Vitals reviewed.  Constitutional:      General: She is not in acute distress.    Appearance: Normal appearance. She is not ill-appearing.  Eyes:     Conjunctiva/sclera: Conjunctivae normal.  Cardiovascular:     Rate and Rhythm: Regular rhythm. Tachycardia present.     Heart sounds: Normal heart sounds. No murmur heard. Pulmonary:     Effort: Pulmonary effort is normal. No respiratory distress.  Breath sounds: Normal breath sounds. No wheezing or rhonchi.  Abdominal:     Palpations: Abdomen is soft.  Musculoskeletal:        General: Normal range of motion.     Cervical back: No swelling, edema, deformity, erythema or signs of trauma. Pain with movement present.  Skin:    General: Skin is warm.  Neurological:     Mental Status: She is alert. Mental status is at baseline.  Psychiatric:        Mood and Affect: Mood normal.        Behavior: Behavior normal.     Health Maintenance Due  Topic Date Due   COVID-19 Vaccine (1) Never done   Pneumococcal Vaccine (1 of 2 - PCV) Never done   Hepatitis B Vaccines 19-59 Average Risk (1 of 3 - 19+ 3-dose series) Never done   HPV VACCINES (1 - 3-dose SCDM series) Never done       Topic Date Due   Hepatitis B Vaccines 19-59 Average Risk (1 of 3 - 19+ 3-dose series) Never done   HPV VACCINES (1 - 3-dose SCDM series) Never done     Lab Results  Component Value Date   TSH 1.34 04/16/2023   Lab Results  Component Value Date   WBC 8.8 11/27/2023   HGB 12.4 11/27/2023   HCT 37.3 11/27/2023   MCV 95 11/27/2023   PLT 351 11/27/2023   Lab Results  Component Value Date   NA 136 12/17/2023   K 4.5 12/17/2023   CO2 19 (L) 12/17/2023   GLUCOSE 72 12/17/2023   BUN 8 12/17/2023   CREATININE 0.59 12/17/2023   BILITOT 0.3 12/17/2023   ALKPHOS 61 12/17/2023   AST 15 12/17/2023   ALT 8 12/17/2023   PROT 7.2 12/17/2023    ALBUMIN 4.5 12/17/2023   CALCIUM 10.0 12/17/2023   ANIONGAP 12 10/18/2023   EGFR 127 12/17/2023   GFR 105.65 10/19/2023   No results found for: CHOL No results found for: HDL No results found for: LDLCALC No results found for: TRIG No results found for: CHOLHDL Lab Results  Component Value Date   HGBA1C 5.1 06/22/2023        Results for orders placed or performed in visit on 03/18/24  POCT URINALYSIS DIP (CLINITEK)   Collection Time: 03/18/24  3:57 PM  Result Value Ref Range   Color, UA yellow yellow   Clarity, UA cloudy (A) clear   Glucose, UA negative negative mg/dL   Bilirubin, UA negative negative   Ketones, POC UA negative negative mg/dL   Spec Grav, UA 8.984 8.989 - 1.025   Blood, UA negative negative   pH, UA 6.5 5.0 - 8.0   POC PROTEIN,UA negative negative, trace   Urobilinogen, UA 0.2 0.2 or 1.0 E.U./dL   Nitrite, UA Negative Negative   Leukocytes, UA Moderate (2+) (A) Negative     Assessment & Plan:   Assessment & Plan Acute right-sided low back pain with right-sided sciatica Low back pain with right-sided sciatica during pregnancy Worsening low back pain with right-sided sciatica. Pain under right rib, extending down right side, worsens with standing. Non-pharmacological management discussed.  Differential include UTI or musculoskeletal. UA in the office showed Lab Results  Component Value Date   COLORU yellow 03/18/2024   CLARITYU cloudy (A) 03/18/2024   GLUCOSEUR negative 03/18/2024   BILIRUBINUR negative 03/18/2024   KETONESU trace 12/06/2022   SPECGRAV 1.015 03/18/2024   RBCUR negative 03/18/2024   PHUR 6.5 03/18/2024  PROTEINUR NEGATIVE 12/26/2023   UROBILINOGEN 0.2 03/18/2024   LEUKOCYTESUR Moderate (2+) (A) 03/18/2024  - will wait for culture to come back before prescribing an antibiotic - Recommend stretches for sciatica.  - Increase fluid intake - Advise use of heat with caution to avoid overheating. Orders:   POCT  URINALYSIS DIP (CLINITEK)   Urine Culture   Comprehensive metabolic panel with GFR  B12 deficiency B12 vitamin deficiency Due for blood work to check B12  Last vitamin B12 and Folate Lab Results  Component Value Date   VITAMINB12 576 12/17/2023   FOLATE >20.0 12/17/2023  - Order blood tests to check B12 Orders:   B12 and Folate Panel  Other iron  deficiency anemia Currently taking prenatal vitamins with iron . Lab Results  Component Value Date   IRON  117 12/17/2023   TIBC 400 12/17/2023   FERRITIN 26 12/17/2023  - labs drawn, Await labs/testing for assessment and recommendations Orders:   Iron , TIBC and Ferritin Panel   CBC with Differential  [redacted] weeks gestation of pregnancy Pregnancy Pregnant with minimal spotting and back pain. No urinary symptoms. Discussed potential kidney issues, no current UTI symptoms. - Send urine for culture to rule out infection. - Check kidney function as last checked on July 14th. - Encourage increased fluid intake.     Follow-up: Return in about 6 months (around 09/16/2024) for chronic.  An After Visit Summary was printed and given to the patient.  Harrie Cedar, FNP [facility] [phone]

## 2024-03-19 LAB — COMPREHENSIVE METABOLIC PANEL WITH GFR
ALT: 9 IU/L (ref 0–32)
AST: 13 IU/L (ref 0–40)
Albumin: 4.1 g/dL (ref 4.0–5.0)
Alkaline Phosphatase: 55 IU/L (ref 41–116)
BUN/Creatinine Ratio: 15 (ref 9–23)
BUN: 8 mg/dL (ref 6–20)
Bilirubin Total: 0.3 mg/dL (ref 0.0–1.2)
CO2: 21 mmol/L (ref 20–29)
Calcium: 9.7 mg/dL (ref 8.7–10.2)
Chloride: 100 mmol/L (ref 96–106)
Creatinine, Ser: 0.52 mg/dL — ABNORMAL LOW (ref 0.57–1.00)
Globulin, Total: 2.6 g/dL (ref 1.5–4.5)
Glucose: 71 mg/dL (ref 70–99)
Potassium: 4.4 mmol/L (ref 3.5–5.2)
Sodium: 135 mmol/L (ref 134–144)
Total Protein: 6.7 g/dL (ref 6.0–8.5)
eGFR: 131 mL/min/1.73 (ref >59)

## 2024-03-19 LAB — IRON,TIBC AND FERRITIN PANEL
Ferritin: 16 ng/mL (ref 15–150)
Iron Saturation: 20 % (ref 15–55)
Iron: 102 ug/dL (ref 27–159)
Total Iron Binding Capacity: 503 ug/dL — ABNORMAL HIGH (ref 250–450)
UIBC: 401 ug/dL (ref 131–425)

## 2024-03-19 LAB — CBC WITH DIFFERENTIAL/PLATELET
Basophils Absolute: 0 x10E3/uL (ref 0.0–0.2)
Basos: 0 %
EOS (ABSOLUTE): 0 x10E3/uL (ref 0.0–0.4)
Eos: 1 %
Hematocrit: 32.8 % — ABNORMAL LOW (ref 34.0–46.6)
Hemoglobin: 11 g/dL — ABNORMAL LOW (ref 11.1–15.9)
Immature Grans (Abs): 0.1 x10E3/uL (ref 0.0–0.1)
Immature Granulocytes: 1 %
Lymphocytes Absolute: 2.1 x10E3/uL (ref 0.7–3.1)
Lymphs: 25 %
MCH: 33.2 pg — ABNORMAL HIGH (ref 26.6–33.0)
MCHC: 33.5 g/dL (ref 31.5–35.7)
MCV: 99 fL — ABNORMAL HIGH (ref 79–97)
Monocytes Absolute: 0.5 x10E3/uL (ref 0.1–0.9)
Monocytes: 5 %
Neutrophils Absolute: 6 x10E3/uL (ref 1.4–7.0)
Neutrophils: 68 %
Platelets: 256 x10E3/uL (ref 150–450)
RBC: 3.31 x10E6/uL — ABNORMAL LOW (ref 3.77–5.28)
RDW: 12.1 % (ref 11.7–15.4)
WBC: 8.7 x10E3/uL (ref 3.4–10.8)

## 2024-03-19 LAB — B12 AND FOLATE PANEL
Folate: >20 ng/mL (ref >3.0)
Vitamin B-12: 384 pg/mL (ref 232–1245)

## 2024-03-20 ENCOUNTER — Ambulatory Visit: Payer: Self-pay | Admitting: Family Medicine

## 2024-03-20 ENCOUNTER — Other Ambulatory Visit (HOSPITAL_BASED_OUTPATIENT_CLINIC_OR_DEPARTMENT_OTHER): Payer: Self-pay

## 2024-03-20 ENCOUNTER — Other Ambulatory Visit: Payer: Self-pay

## 2024-03-20 DIAGNOSIS — R8271 Bacteriuria: Secondary | ICD-10-CM

## 2024-03-20 LAB — URINE CULTURE

## 2024-03-20 MED ORDER — AMOXICILLIN 875 MG PO TABS
875.0000 mg | ORAL_TABLET | Freq: Two times a day (BID) | ORAL | 0 refills | Status: AC
  Filled 2024-03-20: qty 20, 10d supply, fill #0

## 2024-03-20 MED ORDER — AMOXICILLIN 875 MG PO TABS: 875.0000 mg | ORAL_TABLET | Freq: Two times a day (BID) | ORAL | 0 refills | Status: DC

## 2024-03-20 MED ORDER — AMOXICILLIN-POT CLAVULANATE 875-125 MG PO TABS: 1.0000 | ORAL_TABLET | Freq: Two times a day (BID) | ORAL | 0 refills | Status: DC

## 2024-03-21 ENCOUNTER — Other Ambulatory Visit (HOSPITAL_BASED_OUTPATIENT_CLINIC_OR_DEPARTMENT_OTHER): Payer: Self-pay

## 2024-03-24 DIAGNOSIS — Z3A25 25 weeks gestation of pregnancy: Secondary | ICD-10-CM | POA: Diagnosis not present

## 2024-03-24 DIAGNOSIS — R10A2 Flank pain, left side: Secondary | ICD-10-CM | POA: Diagnosis not present

## 2024-03-24 DIAGNOSIS — O26892 Other specified pregnancy related conditions, second trimester: Secondary | ICD-10-CM | POA: Diagnosis not present

## 2024-03-24 DIAGNOSIS — N1330 Unspecified hydronephrosis: Secondary | ICD-10-CM | POA: Diagnosis not present

## 2024-03-24 DIAGNOSIS — O26839 Pregnancy related renal disease, unspecified trimester: Secondary | ICD-10-CM | POA: Diagnosis not present

## 2024-03-31 ENCOUNTER — Other Ambulatory Visit (HOSPITAL_BASED_OUTPATIENT_CLINIC_OR_DEPARTMENT_OTHER): Payer: Self-pay

## 2024-03-31 ENCOUNTER — Other Ambulatory Visit: Payer: Self-pay

## 2024-03-31 MED ORDER — URSODIOL 300 MG PO CAPS
300.0000 mg | ORAL_CAPSULE | Freq: Two times a day (BID) | ORAL | 3 refills | Status: AC
  Filled 2024-03-31: qty 180, 90d supply, fill #0

## 2024-04-02 DIAGNOSIS — R000 Tachycardia, unspecified: Secondary | ICD-10-CM | POA: Diagnosis not present

## 2024-04-02 DIAGNOSIS — Z5321 Procedure and treatment not carried out due to patient leaving prior to being seen by health care provider: Secondary | ICD-10-CM | POA: Diagnosis not present

## 2024-04-02 DIAGNOSIS — R42 Dizziness and giddiness: Secondary | ICD-10-CM | POA: Diagnosis not present

## 2024-04-06 DIAGNOSIS — Z7982 Long term (current) use of aspirin: Secondary | ICD-10-CM | POA: Diagnosis not present

## 2024-04-06 DIAGNOSIS — Z3A29 29 weeks gestation of pregnancy: Secondary | ICD-10-CM | POA: Diagnosis not present

## 2024-04-06 DIAGNOSIS — O99891 Other specified diseases and conditions complicating pregnancy: Secondary | ICD-10-CM | POA: Diagnosis not present

## 2024-04-06 DIAGNOSIS — Z79899 Other long term (current) drug therapy: Secondary | ICD-10-CM | POA: Diagnosis not present

## 2024-04-06 DIAGNOSIS — R32 Unspecified urinary incontinence: Secondary | ICD-10-CM | POA: Diagnosis not present

## 2024-04-06 DIAGNOSIS — Z3A27 27 weeks gestation of pregnancy: Secondary | ICD-10-CM | POA: Diagnosis not present

## 2024-04-06 DIAGNOSIS — Z3689 Encounter for other specified antenatal screening: Secondary | ICD-10-CM | POA: Diagnosis not present

## 2024-04-09 DIAGNOSIS — Q9359 Other deletions of part of a chromosome: Secondary | ICD-10-CM | POA: Diagnosis not present

## 2024-04-09 DIAGNOSIS — Z3A27 27 weeks gestation of pregnancy: Secondary | ICD-10-CM | POA: Diagnosis not present

## 2024-04-09 DIAGNOSIS — O26642 Intrahepatic cholestasis of pregnancy, second trimester: Secondary | ICD-10-CM | POA: Diagnosis not present

## 2024-04-09 DIAGNOSIS — Z3689 Encounter for other specified antenatal screening: Secondary | ICD-10-CM | POA: Diagnosis not present

## 2024-04-09 DIAGNOSIS — Z8759 Personal history of other complications of pregnancy, childbirth and the puerperium: Secondary | ICD-10-CM | POA: Diagnosis not present

## 2024-04-09 DIAGNOSIS — L299 Pruritus, unspecified: Secondary | ICD-10-CM | POA: Diagnosis not present

## 2024-04-09 DIAGNOSIS — O26643 Intrahepatic cholestasis of pregnancy, third trimester: Secondary | ICD-10-CM | POA: Diagnosis not present

## 2024-04-09 DIAGNOSIS — Z369 Encounter for antenatal screening, unspecified: Secondary | ICD-10-CM | POA: Diagnosis not present

## 2024-04-15 DIAGNOSIS — R7302 Impaired glucose tolerance (oral): Secondary | ICD-10-CM | POA: Diagnosis not present

## 2024-04-16 DIAGNOSIS — Z419 Encounter for procedure for purposes other than remedying health state, unspecified: Secondary | ICD-10-CM | POA: Diagnosis not present

## 2024-04-18 ENCOUNTER — Ambulatory Visit: Admitting: Neurology

## 2024-04-18 DIAGNOSIS — O26642 Intrahepatic cholestasis of pregnancy, second trimester: Secondary | ICD-10-CM | POA: Diagnosis not present

## 2024-04-24 DIAGNOSIS — O99891 Other specified diseases and conditions complicating pregnancy: Secondary | ICD-10-CM | POA: Diagnosis not present

## 2024-04-24 DIAGNOSIS — Z3689 Encounter for other specified antenatal screening: Secondary | ICD-10-CM | POA: Diagnosis not present

## 2024-04-24 DIAGNOSIS — M549 Dorsalgia, unspecified: Secondary | ICD-10-CM | POA: Diagnosis not present

## 2024-04-24 DIAGNOSIS — Z3A30 30 weeks gestation of pregnancy: Secondary | ICD-10-CM | POA: Diagnosis not present

## 2024-04-25 DIAGNOSIS — O26642 Intrahepatic cholestasis of pregnancy, second trimester: Secondary | ICD-10-CM | POA: Diagnosis not present

## 2024-04-25 DIAGNOSIS — Z3A30 30 weeks gestation of pregnancy: Secondary | ICD-10-CM | POA: Diagnosis not present

## 2024-04-25 NOTE — Progress Notes (Signed)
 Doing well no lof or bleeding Weekly testing for cholestasis Bpp later today  Sono for growth last week Has follow up growth in [place]

## 2024-05-01 DIAGNOSIS — Z3A31 31 weeks gestation of pregnancy: Secondary | ICD-10-CM | POA: Diagnosis not present

## 2024-05-01 DIAGNOSIS — O99891 Other specified diseases and conditions complicating pregnancy: Secondary | ICD-10-CM | POA: Diagnosis not present

## 2024-05-01 DIAGNOSIS — Z3689 Encounter for other specified antenatal screening: Secondary | ICD-10-CM | POA: Diagnosis not present

## 2024-05-01 DIAGNOSIS — O26643 Intrahepatic cholestasis of pregnancy, third trimester: Secondary | ICD-10-CM | POA: Diagnosis not present

## 2024-05-01 DIAGNOSIS — R112 Nausea with vomiting, unspecified: Secondary | ICD-10-CM | POA: Diagnosis not present

## 2024-05-02 DIAGNOSIS — O26643 Intrahepatic cholestasis of pregnancy, third trimester: Secondary | ICD-10-CM | POA: Diagnosis not present

## 2024-05-02 DIAGNOSIS — Z3A31 31 weeks gestation of pregnancy: Secondary | ICD-10-CM | POA: Diagnosis not present

## 2024-05-04 DIAGNOSIS — R519 Headache, unspecified: Secondary | ICD-10-CM | POA: Diagnosis not present

## 2024-05-04 DIAGNOSIS — O26643 Intrahepatic cholestasis of pregnancy, third trimester: Secondary | ICD-10-CM | POA: Diagnosis not present

## 2024-05-04 DIAGNOSIS — O99891 Other specified diseases and conditions complicating pregnancy: Secondary | ICD-10-CM | POA: Diagnosis not present

## 2024-05-04 DIAGNOSIS — O26893 Other specified pregnancy related conditions, third trimester: Secondary | ICD-10-CM | POA: Diagnosis not present

## 2024-05-04 DIAGNOSIS — Z3A31 31 weeks gestation of pregnancy: Secondary | ICD-10-CM | POA: Diagnosis not present

## 2024-05-04 DIAGNOSIS — Z3689 Encounter for other specified antenatal screening: Secondary | ICD-10-CM | POA: Diagnosis not present

## 2024-05-04 DIAGNOSIS — R42 Dizziness and giddiness: Secondary | ICD-10-CM | POA: Diagnosis not present

## 2024-05-05 ENCOUNTER — Other Ambulatory Visit (HOSPITAL_BASED_OUTPATIENT_CLINIC_OR_DEPARTMENT_OTHER): Payer: Self-pay

## 2024-05-05 DIAGNOSIS — R9431 Abnormal electrocardiogram [ECG] [EKG]: Secondary | ICD-10-CM | POA: Diagnosis not present

## 2024-05-05 MED ORDER — ALBUTEROL SULFATE HFA 108 (90 BASE) MCG/ACT IN AERS
2.0000 | INHALATION_SPRAY | Freq: Four times a day (QID) | RESPIRATORY_TRACT | 1 refills | Status: AC | PRN
  Filled 2024-05-05: qty 6.7, 25d supply, fill #0

## 2024-05-05 MED ORDER — BUTALBITAL-APAP-CAFFEINE 50-300-40 MG PO CAPS
2.0000 | ORAL_CAPSULE | Freq: Four times a day (QID) | ORAL | 0 refills | Status: AC | PRN
  Filled 2024-05-05: qty 40, 5d supply, fill #0

## 2024-05-05 MED ORDER — ONDANSETRON 4 MG PO TBDP
8.0000 mg | ORAL_TABLET | Freq: Three times a day (TID) | ORAL | 0 refills | Status: AC | PRN
  Filled 2024-05-05: qty 20, 4d supply, fill #0

## 2024-05-08 DIAGNOSIS — R079 Chest pain, unspecified: Secondary | ICD-10-CM | POA: Diagnosis not present

## 2024-05-08 DIAGNOSIS — O26643 Intrahepatic cholestasis of pregnancy, third trimester: Secondary | ICD-10-CM | POA: Diagnosis not present

## 2024-05-08 DIAGNOSIS — Z79899 Other long term (current) drug therapy: Secondary | ICD-10-CM | POA: Diagnosis not present

## 2024-05-08 DIAGNOSIS — O4703 False labor before 37 completed weeks of gestation, third trimester: Secondary | ICD-10-CM | POA: Diagnosis not present

## 2024-05-08 DIAGNOSIS — Z3A32 32 weeks gestation of pregnancy: Secondary | ICD-10-CM | POA: Diagnosis not present

## 2024-05-08 DIAGNOSIS — Z885 Allergy status to narcotic agent status: Secondary | ICD-10-CM | POA: Diagnosis not present

## 2024-05-08 DIAGNOSIS — Z3689 Encounter for other specified antenatal screening: Secondary | ICD-10-CM | POA: Diagnosis not present

## 2024-05-08 DIAGNOSIS — Z7982 Long term (current) use of aspirin: Secondary | ICD-10-CM | POA: Diagnosis not present

## 2024-05-08 DIAGNOSIS — R42 Dizziness and giddiness: Secondary | ICD-10-CM | POA: Diagnosis not present

## 2024-05-08 DIAGNOSIS — Z8751 Personal history of pre-term labor: Secondary | ICD-10-CM | POA: Diagnosis not present

## 2024-05-08 NOTE — Progress Notes (Signed)
 Outpatient Cardiology Clinic   Cardiology New Patient Consultation  Consulting Cardiologist:  Elsie Gilmore Loreli Mickey, MD, [facility] Referring Physician:  Ward Primary Care Provider: Vicenta FORBES Saras, MD  Reason for Visit/Chief Complaint:  Chief Complaint  Patient presents with  . Establish Care    Dizziness    History of Present Illness, Interval History, Medication, and PMH Review:   HPI:  [name] is a friendly 28 y.o. 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  She is currently [redacted] weeks pregnant with her third child which will be induced most likely in the first week of January 2026.  Patient presents now for intermittent episodes of dizziness and lightheadedness as well as mild shortness of breath.  She had 1 episode of near syncope.  These events are often postural related versus when she was on the floor changing a diaper and then went to stand up she felt transient lightheadedness.  She denies having chest pain or frank syncope.  EKG today May 08, 2024 shows sinus rhythm at 97 bpm with normal axis and was otherwise normal.  [facility] Holter monitor [facility] 13 days and 21 hours completed on September 05, 2023 which showed sinus rhythm throughout with heart rates ranging from 59 to 178 bpm with the average heart rate of 92 bpm.  Patient was in sinus rhythm throughout with rare isolated SVE's burden less than 1% and ventricular ectopy burden less than 1%.  Patient's identified symptoms corresponded with sinus tachycardia.  Echocardiogram of July 27, 2022 shows preserved ejection fraction of 55% to 60% with global longitudinal strain of -21.9%.  Right ventricular size and function was normal cardiac valve structure and function was normal.  Left atrial size was normal with no evidence of pericardial effusion and normal IVC.  Blood pressure sitting was 90/60 mmHg with heart  rate of 102 bpm standing 105/70 mmHg with heart rate of 123 bpm and the first 30 seconds then decreasing to 105 bpm.  Laboratories April 09, 2024 show sodium 136, potassium 3.6, BUN 11, creatinine 0.5, glucose 162, hemoglobin 11.1, white count 8.0, hematocrit 32%, TSH 1.04.  Today May 08, 2024 blood pressure is 110/72 mmHg with a heart rate of 115 bpm.  Review of Systems:   Review of Systems  Constitutional: Negative for fever and weight loss.  HENT:  Negative for nosebleeds.   Eyes:  Negative for visual disturbance.  Cardiovascular:  Positive for irregular heartbeat, near-syncope and palpitations. Negative for chest pain, claudication, dyspnea on exertion, leg swelling and syncope.  Respiratory:  Negative for cough, shortness of breath and wheezing.   Endocrine: Negative for polyuria.  Hematologic/Lymphatic: Negative for adenopathy.  Skin:  Negative for rash.  Musculoskeletal:  Negative for joint pain and muscle weakness.  Gastrointestinal:  Negative for abdominal pain, constipation, hematemesis and nausea.  Neurological:  Positive for dizziness. Negative for focal weakness and paresthesias.  Psychiatric/Behavioral:  Negative for depression.     Vital Signs and Physical Examination:   Vitals:    Vitals:   05/08/24 0859  BP: 110/72  Pulse: (!) 115  SpO2: 98%  Weight: 77.2 kg (170 lb 1.6 oz)  Height: 1.651 m (5' 5)    BP Readings from Last 10 Encounters:  05/08/24 110/72  04/25/24 120/80  04/09/24 121/79  03/14/24 104/74  02/13/24 90/60  01/16/24 102/62  06/30/22  117/71  02/20/22 (!) 101/58  12/20/20 120/80  10/18/20 90/60    Pulse Readings from Last 3 Encounters:  05/08/24 (!) 115  06/30/22 84  02/20/22 98    Wt Readings from Last 10 Encounters:  05/08/24 77.2 kg (170 lb 1.6 oz)  04/25/24 75.3 kg (166 lb)  04/09/24 75 kg (165 lb 6.4 oz)  03/14/24 72.5 kg (159 lb 12.8 oz)  02/13/24 67.4 kg (148 lb 9.6 oz)  01/16/24 64 kg (141 lb 3.2 oz)  06/30/22 70.3 kg  (155 lb)  02/20/22 55.8 kg (123 lb)  12/20/20 55 kg (121 lb 4 oz)  10/18/20 55 kg (121 lb 4 oz)    Body mass index is 28.31 kg/m.   Vitals reviewed.  Constitutional:      Appearance: Normal and healthy appearance.  Eyes:     General: No scleral icterus.    Extraocular Movements: Extraocular movements intact.     Conjunctiva/sclera: Conjunctivae normal.  HENT:     Head: Normocephalic.  Neck:     Thyroid : No thyroid  mass.     Vascular: No carotid bruit or JVD.     Trachea: Trachea normal.     Lymphadenopathy: No cervical adenopathy.  Pulmonary:     Effort: Pulmonary effort is normal.     Breath sounds: Normal breath sounds. No wheezing. No rales.  Chest:     Chest wall: Not tender to palpatation. No deformity.  Cardiovascular:     PMI at left midclavicular line. Normal rate. Regular rhythm. Normal S1. Normal S2.      Murmurs: There is no murmur.     No gallop.   Pulses:    Intact distal pulses.  Edema:    Peripheral edema absent.  Abdominal:     General: Bowel sounds are normal.     Palpations: There is no abdominal mass.     Tenderness: There is no abdominal tenderness. There is no guarding.  Musculoskeletal:     Cervical back: Normal range of motion. Skin:    Coloration: Skin is not jaundiced.     Findings: No rash.  Feet:     Right foot:     Skin integrity: No ulcer or erythema.     Left foot:     Skin integrity: No ulcer or erythema.  Neurological:     General: No focal deficit present.     Mental Status: Alert and oriented to person, place, and time.     Gait: Gait is intact.  Psychiatric:        Attention and Perception: Attention normal.        Mood and Affect: Mood normal.        Speech: Speech normal.        Behavior: Behavior is cooperative.        Thought Content: Thought content normal.    Labs: Lab Results  Component Value Date   WBC 8.00 04/09/2024   HGB 11.1 (L) 04/09/2024   HCT 32.0 (L) 04/09/2024   MCV 96.1 (H) 04/09/2024   PLT 234  04/09/2024    Lab Results  Component Value Date   CREATININE 0.51 (L) 04/09/2024   BUN 5 (L) 04/09/2024   NA 136 04/09/2024   K 3.6 04/09/2024   CL 102 04/09/2024   CO2 23 04/09/2024     Lab Results  Component Value Date   TSH 1.044 01/17/2024     Assessment and Plan:   Patient is a friendly 28 year old woman with gravida 3 para  2 current pregnancy at 32 weeks presenting with intermittent episodes of lightheadedness typically postural related from sitting or lying to standing position.  Patient did have orthostatic tendencies today with soft blood pressure readings making her more vulnerable to this phenomenon.  She is known by Holter and baseline EKGs to have no abnormalities electrically with no evidence of preexcitation or long QT in addition echocardiogram February 2024 shows preserved ejection fraction of 55 to 60% and was otherwise normal.  Electrolytes, CBC and thyroid  function are all normal.  Patient is on supplemental magnesium.  At this juncture given the late nature of her symptoms it would be reasonable to consider some other agents such as Florinef 0.1 mg a day to add to her overall volume and her circulation however at this juncture we will first try adding extra salt and fluids by behavior modification and start wearing TED hose stockings 15 to 20 mm pressure.  A pamphlet for the local stocking company was given to patient.  She plans to be induced in pregnancy during the first week of January.  I believe with these more modest measures we will be able to bridge the time.  Left of 1 more month of her pregnancy with some mitigation in her symptoms.  Overall she is doing well and will follow-up in 6 months.  I recommend that the patient follow-up with cardiology services in 6 months.  Health Maintenance issues including appropriate healthy diet, exercise and smoking avoidance were discussed with patient. Risks, benefits, and alternatives of the medications and treatment plan  prescribed today were discussed, and patient expressed understanding. Plan follow-up as discussed or as needed if any worsening symptoms or change in condition.  I have personally spent 45 minutes involved in face-to-face and non-face-to-face activities for this patient on the day of the visit.    Electronically signed by: Elsie Gilmore Loreli Mickey, MD 05/08/2024 9:06 AM

## 2024-05-08 NOTE — Progress Notes (Signed)
 Tricia Allen [id number] at [redacted]w[redacted]d for ROB. Estimated Date of Delivery: 07/03/24 LMP 09/27/2023   She is doing ok, denies vaginal bleeding, LOF, She reports good FM.  Visibly uncomfortable in exam room, rates abdominal pain 10/10, cramping started ~2 hrs ago, not really timing them, maybe ~15 mins. Cervix dilated 1cm on exam.  NIPT- Low risk, Prev carrier screen- pt reports negative AFP- negative  Anatomy- Complete with MFM GTT- Passed 3 hr , declines Tdap   OB Hx significant for: Hx PreE- Baseline PreE labs reviewed, p/c ratio = 0.12. Taking daily ASA   Cholestasis (26w) -Bile acids 10.4 10/27 -Taking Ursodiol  BID, itching has not improved, may increase to TID. LFTs ok as of 11/5, repeat CMP today Timing of delivery- TBD Weekly surveillance   Depression- Currently well controlled off medications.   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    MFM appt on 9/12 reviewed, anatomy complete Last growth 11/5- EFW 60%ile AFI nml  ?prominent bowel  Next MFM appt tomorrow (12/5)  To L&D to rule out PTL.  Precautions reviewed   Return in about 1 week (around 05/15/2024) for Weekly BPP/ROB.

## 2024-05-09 ENCOUNTER — Ambulatory Visit: Attending: Obstetrics and Gynecology

## 2024-05-09 ENCOUNTER — Ambulatory Visit: Admitting: Maternal & Fetal Medicine

## 2024-05-09 ENCOUNTER — Other Ambulatory Visit: Payer: Self-pay | Admitting: Obstetrics and Gynecology

## 2024-05-09 VITALS — BP 112/58

## 2024-05-09 DIAGNOSIS — Z3A32 32 weeks gestation of pregnancy: Secondary | ICD-10-CM | POA: Diagnosis not present

## 2024-05-09 DIAGNOSIS — O358XX0 Maternal care for other (suspected) fetal abnormality and damage, not applicable or unspecified: Secondary | ICD-10-CM

## 2024-05-09 DIAGNOSIS — K831 Obstruction of bile duct: Secondary | ICD-10-CM | POA: Diagnosis not present

## 2024-05-09 DIAGNOSIS — O09292 Supervision of pregnancy with other poor reproductive or obstetric history, second trimester: Secondary | ICD-10-CM

## 2024-05-09 DIAGNOSIS — O09293 Supervision of pregnancy with other poor reproductive or obstetric history, third trimester: Secondary | ICD-10-CM | POA: Diagnosis not present

## 2024-05-09 DIAGNOSIS — O26613 Liver and biliary tract disorders in pregnancy, third trimester: Secondary | ICD-10-CM | POA: Diagnosis not present

## 2024-05-09 NOTE — Progress Notes (Unsigned)
 N/a

## 2024-05-09 NOTE — Progress Notes (Signed)
 Patient information  Patient Name: Tricia Allen  Patient MRN:   [id number]  Referring practice: [facility] Referring Provider: June Flint  Problem List   Patient Active Problem List   Diagnosis Date Noted   Acute right-sided low back pain with right-sided sciatica 03/18/2024   [redacted] weeks gestation of pregnancy 03/18/2024   Strep throat 02/29/2024   Sore throat 02/29/2024   Supervision of high risk pregnancy, antepartum 11/27/2023   B12 deficiency 10/27/2023   Hypoglycemia 10/27/2023   Elevated serum glucose 10/19/2023   Right upper quadrant pain 10/19/2023   Abnormal bruising 09/14/2023   Acute non-recurrent frontal sinusitis 08/16/2023   Urinary incontinence without sensory awareness 07/14/2023   Vitamin D  deficiency 04/20/2023   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   Dizziness 04/16/2023   Fatigue due to depression 04/12/2023   GAD (generalized anxiety disorder) 04/12/2023   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   Congenital thyroid  hemiagenesis 08/29/2022   Anemia 08/08/2022   Chronic migraine with aura without status migrainosus, not intractable 04/24/2022   History of pre-eclampsia 03/27/2022   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 G3P2002 at [redacted]w[redacted]d here for ultrasound and consultation. Tricia Allen is doing well today.  The patient is here for growth ultrasound and BPP due to intrahepatic cholestasis of pregnancy (ICP).  She reports only having her bile acids drawn once showing a bile acid level of 12 mol/L, consistent with mild ICP.  I discussed the importance of serial laboratory assessment to see if the bile acids are increasing since this may significantly alter delivery timing mild ICP is associated primarily with maternal pruritus and a low risk of fetal complications, though bile acids may rise as pregnancy progresses. She is currently  taking Actigall  (ursodiol ), which is appropriate for symptom relief and may modestly lower bile acids, although its impact on fetal outcomes is uncertain. We reviewed that fetal monitoring should focus on maternal awareness of fetal movement, and while antenatal testing can be offered, it has not been proven to prevent stillbirth. Delivery timing for mild ICP is typically recommended between 36-39 weeks, depending on the highest documented bile acid level and overall clinical course.  She will require monthly CMP and bile acids to monitor for progression, as rising levels--especially above 40 or 100 mol/L--may alter management and delivery timing. The patient understands the diagnosis, the expected monitoring plan, and that all recommended testing and ongoing management will occur at the referring provider's office.  The ultrasound today shows a normal fetal growth in the 40th percentile with normal amniotic fluid and BPP of 8 out of 8. Recommendations -Continue ursodiol  (Actigall ) for maternal symptom relief. -Obtain monthly bile acids and CMP at the referring provider's office once a month.  This is imperative to help determine the timing of delivery. -Third-trimester growth ultrasounds every 4 months and weekly antenatal testing, coordinated by the referring provider. -Counseled on fetal movement monitoring and return precautions. -Delivery timing based on bile acids, liver enzymes and symptoms:  <40 mol/L: consider delivery toward 39 weeks.  40-99 mol/L: consider delivery closer to 36-37 weeks.  >=100 mol/L: offer delivery at 36 0/7 weeks. Consider 34-36 week delivery for bile acids >=100 mol/L with severe refractory pruritus, prior ICP-related stillbirth before 36 weeks, or worsening hepatic disease.  Reinforce fetal movement awareness and return precautions.  I spent 30 minutes reviewing the patients chart, including labs and images  as well as counseling the patient about her medical  conditions. Greater than 50% of the time was spent in direct face-to-face patient counseling.  Delora Smaller  MFM, [place]   05/09/2024  1:02 PM   Review of Systems: A review of systems was performed and was negative except per HPI   Vitals and Physical Exam    05/09/2024   11:05 AM 03/18/2024    3:24 PM 02/29/2024    8:34 AM  Vitals with BMI  Height  5' 2 5' 2  Weight  158 lbs 6 oz 153 lbs 3 oz  BMI  28.96 28.01  Systolic 112 128 899  Diastolic 58 72 60  Pulse  94 99    Sitting comfortably on the sonogram table Nonlabored breathing Normal rate and rhythm Abdomen is nontender  Past pregnancies OB History  Gravida Para Term Preterm AB Living  3 2 2  0 0 2  SAB IAB Ectopic Multiple Live Births  0 0 0  2    # Outcome Date GA Lbr Len/2nd Weight Sex Type Anes PTL Lv  3 Current           2 Term 10/17/22 [redacted]w[redacted]d / 01:05 7 lb 12.5 oz (3.53 kg) M Vag-Spont EPI N LIV  1 Term 05/08/17 [redacted]w[redacted]d  7 lb 7 oz (3.374 kg) M Vag-Spont EPI N LIV     Future Appointments  Date Time Provider Department Center  05/12/2024  2:30 PM Georjean Darice HERO, MD [facility] None

## 2024-05-12 ENCOUNTER — Telehealth: Admitting: Neurology

## 2024-05-12 ENCOUNTER — Encounter: Payer: Self-pay | Admitting: Neurology

## 2024-05-12 VITALS — Ht 62.0 in | Wt 170.0 lb

## 2024-05-12 DIAGNOSIS — G44219 Episodic tension-type headache, not intractable: Secondary | ICD-10-CM

## 2024-05-12 DIAGNOSIS — R42 Dizziness and giddiness: Secondary | ICD-10-CM

## 2024-05-12 DIAGNOSIS — R531 Weakness: Secondary | ICD-10-CM | POA: Diagnosis not present

## 2024-05-12 NOTE — Progress Notes (Signed)
 Virtual Visit via Video Note  This visit type was conducted with patient consent. This format is felt to be most appropriate for this patient at this time. Physical exam was limited by quality of the video and audio technology used for the visit.    Consent was obtained for video visit:  Yes.   Answered questions that patient had about telehealth interaction:  Yes.   Patient is aware of the limitations, risks, security and privacy concerns of performing an evaluation and management service by telemedicine. The patient expressed understanding and agreed to proceed.  Pt location: private vehicle Physician Location: office Name of referring provider:  Teressa Harrie HERO, FNP I connected with Tricia Gowda Knowles at patients initiation/request on 05/12/2024 at  2:30 PM EST by video enabled telemedicine application and verified that I am speaking with the correct person using two identifiers. Pt MRN:  [id number] Pt DOB:  [birth year] Video Participants:  Tricia Allen   History of Present Illness:  The patient had a virtual video visit on 05/12/2024. She was last seen in the neurology clinic 8 months ago for dizziness, right-sided weakness, and frequent headaches. MRI brain no acute changes. EMG/NCV of the right arm and leg were normal. MRI cervical spine showed small central to right paracentral disc protrusion at C6-7 without significant stenosis, The ventral right C7 nerve root could potentially be affected; mild noncompressive disc bulging at C3-4 throughout C5-6 without significant stenosis or neural impingement. She had physical therapy which helped, the right-sided weakness has not been bothering her.  She is now [redacted] weeks pregnant, due on January 29 but may be induced at 37 weeks. She was having pretty bad headaches during her first trimester, taking prn medication almost daily. As her pregnancy progressed, headaches have been pretty decent and not significantly bothersome. She did go to  triage the other day with a headache to make sure it was not preeclampsia. The main issue continues to be the lightheadedness and chronic dizziness. We discussed that there is no clear neurological cause, she has seen Cardiology at [facility] noting lightheadedness typically postural related, with orthostatic tendencies making her more vulnerable to this. Florinef was suggested, she has decided to hold off on medication.     History on Initial Assessment 07/02/2023: This is a 28 year old right-handed woman with a history of anxiety, depression, presenting for dizziness and right-sided weakness. She states she started having dizziness a couple of months after the delivery of her baby in May 2024. She was dizzy almost daily, feeling lightheaded like she was going to pass out. Vision gets blurred, walking makes it worse. She has noticed bright lights and a lot of stimuli can also trigger it. She feels off balance, no spinning sensation, no nausea/vomiting. She started seeing Cardiology in January 2024 for intermittent palpitations leading to lightheadedness and dizziness. Zio patch and echocardiogram in 07/2022 were normal. The other week she was having it all day and had to lay down, but sometimes dizziness does not resolve even when supine. She has headaches but it is not always associated with the dizziness. She notes headaches started a few months ago with pressure in the frontal regions and a heaviness in the back of her head. Pain in the back of her head occurs when there is neck pain. No nausea/vomiting, there is occasional sensitivity to lights and sounds. Tylenol  or Ibuprofen  may help, she also takes Excedrin. She has been using a headache cap which also helps. Pain occurs a  couple of times a week, they do not wake her from sleep. On her PCP appointment on 06/21/22, she reported new onset numbness and tingling of the right arm and leg. She woke up in the middle of the night on 1/10 with her right leg  feeling very heavy. Right arm was also affected. It lasted for a couple of days. She was also having bad neck pain that time. No bowel/bladder dysfunction. She denies any head injuries, falls, or heavy lifting. Last weekend, she had neck pain and right leg heaviness on and off throughout the day. When symptoms occur, the back of her head through the neck really hurts. No facial symptoms but sometimes she feels like she cannot swallow. Vision is blurred, she has floaters. Recent eye exam was normal. She gets an average of 8 hours of sleep.   Diagnostic Data: I personally reviewed MRI brain with and without contrast done 05/2023 which did not show any acute changes. 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  EMG/NCV of the right arm and leg in 07/2023 normal.   MRI cervical spine without contrast done 07/2023 showed small central to right paracentral disc protrusion at C6-7 without significant stenosis, The ventral right C7 nerve root could potentially be affected; mild noncompressive disc bulging at C3-4 throughout C5-6 without significant stenosis or neural impingement.    Lab Results  Component Value Date   HGBA1C 5.1 06/22/2023     Current Outpatient Medications on File Prior to Visit  Medication Sig Dispense Refill   albuterol  (VENTOLIN  HFA) 108 (90 Base) MCG/ACT inhaler Inhale 2 puffs into the lungs every 6 (six) hours as needed for wheezing 6.7 g 1   aspirin  EC 81 MG tablet Take 81 mg by mouth daily. Swallow whole.     Butalbital -APAP-Caffeine  50-300-40 MG CAPS Take 2 capsules by mouth every 6 (six) hours as needed (headache). 40 capsule 0   cyanocobalamin  (VITAMIN B12) 1000 MCG tablet Take 1,000 mcg by mouth daily.     Ferrous Sulfate  (IRON  PO) Take 1 tablet by mouth daily.     Omega-3 Fatty Acids (OMEGA 3 FISH OIL PO) Take by mouth.     ondansetron  (ZOFRAN -ODT) 4 MG disintegrating tablet Take 2 tablets (8  mg total) by mouth every 8 (eight) hours as needed for nausea or vomiting. 20 tablet 0   Prenatal Vit-Fe Fumarate-FA (PRENATAL VITAMINS PO) Take 1 capsule by mouth daily.     ursodiol  (ACTIGALL ) 300 MG capsule Take 1 capsule (300 mg total) by mouth 2 (two) times daily. 180 capsule 3   No current facility-administered medications on file prior to visit.     Observations/Objective:   Vitals:   05/12/24 1141  Weight: 170 lb (77.1 kg)  Height: 5' 2 (1.575 m)   GEN:  The patient appears stated age and is in NAD.  Neurological examination: Patient is awake, alert. No aphasia or dysarthria. Intact fluency and comprehension. Cranial nerves: Extraocular movements intact. No facial asymmetry. Motor: moves all extremities symmetrically, at least anti-gravity x 4.    Assessment and Plan:   This is a 28 yo RH woman with a history of anxiety, depression, who presented for dizziness and right-sided weakness. MRI brain no acute changes, there is note of low lying tonsils which would not typically cause these symptoms. Nerve conduction test normal. Cervical MRI showed disc protrusion at C6-7, ventral right C7 nerve root could potentially be affected. There  was also mild disc bulges throughout the cervical region. Right-sided weakness has resolved, she completed physical therapy. She continues to report dizziness, no clear neurological cause, Cardiology noted postural lightheadedness and suggested Florinef. She was reporting frequent headaches that have quieted down with her last trimester, continue to monitor post-partum, we may consider a daily preventative medication if needed. Follow-up in 3 months, call for any changes.    Follow Up Instructions:   -I discussed the assessment and treatment plan with the patient. The patient was provided an opportunity to ask questions and all were answered. The patient agreed with the plan and demonstrated an understanding of the instructions.   The patient was  advised to call back or seek an in-person evaluation if the symptoms worsen or if the condition fails to improve as anticipated.    Darice CHRISTELLA Shivers, MD

## 2024-05-12 NOTE — Patient Instructions (Signed)
 Good to see you overall doing well. Wishing you all the best!  Continue follow-up with Cardiology for dizziness. Follow-up in 3 months, call for any changes.

## 2024-05-14 DIAGNOSIS — L299 Pruritus, unspecified: Secondary | ICD-10-CM | POA: Diagnosis not present

## 2024-05-14 DIAGNOSIS — Z3A32 32 weeks gestation of pregnancy: Secondary | ICD-10-CM | POA: Diagnosis not present

## 2024-05-14 DIAGNOSIS — O26643 Intrahepatic cholestasis of pregnancy, third trimester: Secondary | ICD-10-CM | POA: Diagnosis not present

## 2024-05-16 DIAGNOSIS — Z419 Encounter for procedure for purposes other than remedying health state, unspecified: Secondary | ICD-10-CM | POA: Diagnosis not present

## 2024-05-20 DIAGNOSIS — O26643 Intrahepatic cholestasis of pregnancy, third trimester: Secondary | ICD-10-CM | POA: Diagnosis not present

## 2024-05-20 DIAGNOSIS — Z3A33 33 weeks gestation of pregnancy: Secondary | ICD-10-CM | POA: Diagnosis not present

## 2024-05-22 DIAGNOSIS — Z3483 Encounter for supervision of other normal pregnancy, third trimester: Secondary | ICD-10-CM | POA: Diagnosis not present

## 2024-05-22 DIAGNOSIS — Z3482 Encounter for supervision of other normal pregnancy, second trimester: Secondary | ICD-10-CM | POA: Diagnosis not present

## 2024-05-28 DIAGNOSIS — O26643 Intrahepatic cholestasis of pregnancy, third trimester: Secondary | ICD-10-CM | POA: Diagnosis not present

## 2024-05-28 DIAGNOSIS — Z3A34 34 weeks gestation of pregnancy: Secondary | ICD-10-CM | POA: Diagnosis not present

## 2024-05-31 DIAGNOSIS — J00 Acute nasopharyngitis [common cold]: Secondary | ICD-10-CM | POA: Diagnosis not present

## 2024-06-18 ENCOUNTER — Other Ambulatory Visit (HOSPITAL_BASED_OUTPATIENT_CLINIC_OR_DEPARTMENT_OTHER): Payer: Self-pay

## 2024-06-18 MED ORDER — IBUPROFEN 800 MG PO TABS
800.0000 mg | ORAL_TABLET | ORAL | 0 refills | Status: AC
  Filled 2024-06-18: qty 30, 7d supply, fill #0

## 2024-06-30 ENCOUNTER — Other Ambulatory Visit (HOSPITAL_BASED_OUTPATIENT_CLINIC_OR_DEPARTMENT_OTHER): Payer: Self-pay

## 2024-09-30 ENCOUNTER — Ambulatory Visit: Admitting: Neurology
